# Patient Record
Sex: Female | Born: 1985 | Hispanic: No | Marital: Married | Smoking: Never smoker
Health system: Southern US, Community
[De-identification: ages and names within clinical notes are randomized; demographics above are authoritative.]

## PROBLEM LIST (undated history)

## (undated) ENCOUNTER — Inpatient Hospital Stay (HOSPITAL_COMMUNITY): Payer: Self-pay

## (undated) DIAGNOSIS — R42 Dizziness and giddiness: Secondary | ICD-10-CM

## (undated) DIAGNOSIS — R0600 Dyspnea, unspecified: Secondary | ICD-10-CM

## (undated) DIAGNOSIS — R51 Headache: Secondary | ICD-10-CM

## (undated) DIAGNOSIS — R519 Headache, unspecified: Secondary | ICD-10-CM

## (undated) DIAGNOSIS — D699 Hemorrhagic condition, unspecified: Secondary | ICD-10-CM

## (undated) DIAGNOSIS — E049 Nontoxic goiter, unspecified: Secondary | ICD-10-CM

## (undated) DIAGNOSIS — O24419 Gestational diabetes mellitus in pregnancy, unspecified control: Secondary | ICD-10-CM

## (undated) HISTORY — DX: Gestational diabetes mellitus in pregnancy, unspecified control: O24.419

---

## 2014-01-25 ENCOUNTER — Inpatient Hospital Stay (HOSPITAL_COMMUNITY)
Admission: AD | Admit: 2014-01-25 | Discharge: 2014-01-25 | Disposition: A | Discharge status: Home / Self Care | Payer: Medicaid Other | Source: Ambulatory Visit | Attending: Obstetrics & Gynecology | Admitting: Obstetrics & Gynecology

## 2014-01-25 ENCOUNTER — Encounter (HOSPITAL_COMMUNITY): Payer: Self-pay

## 2014-01-25 ENCOUNTER — Inpatient Hospital Stay (HOSPITAL_COMMUNITY): Payer: Medicaid Other

## 2014-01-25 DIAGNOSIS — O26851 Spotting complicating pregnancy, first trimester: Secondary | ICD-10-CM

## 2014-01-25 DIAGNOSIS — R109 Unspecified abdominal pain: Secondary | ICD-10-CM | POA: Diagnosis present

## 2014-01-25 DIAGNOSIS — N760 Acute vaginitis: Secondary | ICD-10-CM | POA: Diagnosis not present

## 2014-01-25 DIAGNOSIS — A499 Bacterial infection, unspecified: Secondary | ICD-10-CM

## 2014-01-25 DIAGNOSIS — O26859 Spotting complicating pregnancy, unspecified trimester: Secondary | ICD-10-CM | POA: Insufficient documentation

## 2014-01-25 DIAGNOSIS — N390 Urinary tract infection, site not specified: Secondary | ICD-10-CM | POA: Insufficient documentation

## 2014-01-25 DIAGNOSIS — B9689 Other specified bacterial agents as the cause of diseases classified elsewhere: Secondary | ICD-10-CM | POA: Diagnosis not present

## 2014-01-25 DIAGNOSIS — O2390 Unspecified genitourinary tract infection in pregnancy, unspecified trimester: Secondary | ICD-10-CM | POA: Diagnosis not present

## 2014-01-25 LAB — URINALYSIS, ROUTINE W REFLEX MICROSCOPIC
Bilirubin Urine: NEGATIVE
Glucose, UA: NEGATIVE mg/dL
KETONES UR: NEGATIVE mg/dL
Leukocytes, UA: NEGATIVE
Nitrite: POSITIVE — AB
PROTEIN: NEGATIVE mg/dL
Specific Gravity, Urine: 1.025 (ref 1.005–1.030)
Urobilinogen, UA: 0.2 mg/dL (ref 0.0–1.0)
pH: 6 (ref 5.0–8.0)

## 2014-01-25 LAB — WET PREP, GENITAL
Trich, Wet Prep: NONE SEEN
Yeast Wet Prep HPF POC: NONE SEEN

## 2014-01-25 LAB — CBC WITH DIFFERENTIAL/PLATELET
BASOS ABS: 0 10*3/uL (ref 0.0–0.1)
BASOS PCT: 0 % (ref 0–1)
EOS ABS: 0.1 10*3/uL (ref 0.0–0.7)
EOS PCT: 1 % (ref 0–5)
HEMATOCRIT: 38.2 % (ref 36.0–46.0)
Hemoglobin: 13.6 g/dL (ref 12.0–15.0)
Lymphocytes Relative: 19 % (ref 12–46)
Lymphs Abs: 2.6 10*3/uL (ref 0.7–4.0)
MCH: 28.6 pg (ref 26.0–34.0)
MCHC: 35.6 g/dL (ref 30.0–36.0)
MCV: 80.3 fL (ref 78.0–100.0)
MONO ABS: 0.9 10*3/uL (ref 0.1–1.0)
Monocytes Relative: 6 % (ref 3–12)
Neutro Abs: 10.2 10*3/uL — ABNORMAL HIGH (ref 1.7–7.7)
Neutrophils Relative %: 74 % (ref 43–77)
Platelets: 187 10*3/uL (ref 150–400)
RBC: 4.76 MIL/uL (ref 3.87–5.11)
RDW: 12.9 % (ref 11.5–15.5)
WBC: 13.8 10*3/uL — ABNORMAL HIGH (ref 4.0–10.5)

## 2014-01-25 LAB — URINE MICROSCOPIC-ADD ON

## 2014-01-25 LAB — HCG, QUANTITATIVE, PREGNANCY: hCG, Beta Chain, Quant, S: 2057 m[IU]/mL — ABNORMAL HIGH (ref <5)

## 2014-01-25 LAB — POCT PREGNANCY, URINE: Preg Test, Ur: POSITIVE — AB

## 2014-01-25 MED ORDER — METRONIDAZOLE 0.75 % VA GEL: 1.0000 | Freq: Two times a day (BID) | VAGINAL | Status: DC

## 2014-01-25 NOTE — MAU Note (Signed)
LMP 12/20/13, spotting yesterday. Lower abd pain today.

## 2014-01-25 NOTE — MAU Provider Note (Signed)
History     CSN: [id number]  Arrival date and time: 01/25/14 3382   First Provider Initiated Contact with Patient 01/25/14 2018      Chief Complaint  Patient presents with  . Possible Pregnancy  . Abdominal Pain  . Vaginal Bleeding   HPI [name] is a 28 y.o. G1P0 at [redacted]w[redacted]d who presents to MAU today with complaint of lower abdominal cramping and spotting. She states that this started yesterday. She states minimal bleeding and has not needed to wear a pad. She states some nausea without vomiting. She denies fever. She does endorse occasional dizziness.    OB History   Grav Para Term Preterm Abortions TAB SAB Ect Mult Living   1               Past Medical History  Diagnosis Date  . Medical history non-contributory     Past Surgical History  Procedure Laterality Date  . No past surgeries      No family history on file.  History  Substance Use Topics  . Smoking status: Never Smoker   . Smokeless tobacco: Never Used  . Alcohol Use: No    Allergies: No Known Allergies  No prescriptions prior to admission    Review of Systems  Constitutional: Negative for fever and malaise/fatigue.  Gastrointestinal: Positive for nausea and abdominal pain. Negative for vomiting.  Genitourinary:       + vaginal bleeding  Neurological: Positive for dizziness. Negative for loss of consciousness.   Physical Exam   Blood pressure 101/60, pulse 95, temperature 98.3 F (36.8 C), temperature source Oral, resp. rate 18, height 5\' 2"  (1.575 m), weight 137 lb (62.143 kg), last menstrual period 12/20/2013, SpO2 100.00%.  Physical Exam  Constitutional: She is oriented to person, place, and time. She appears well-developed and well-nourished. No distress.  HENT:  Head: Normocephalic.  Cardiovascular: Normal rate.   Respiratory: Effort normal.  GI: Soft. She exhibits no distension. There is tenderness (mild tenderness to palpation of the suprapubic region).  Genitourinary: Uterus is  tender (mild). Uterus is not enlarged. Cervix exhibits no motion tenderness, no discharge and no friability. Right adnexum displays no mass and no tenderness. Left adnexum displays no mass and no tenderness. No bleeding around the vagina. Vaginal discharge (scant thin white discharge noted) found.  Cervix: closed  Neurological: She is alert and oriented to person, place, and time.  Skin: Skin is warm and dry. No erythema.  Psychiatric: She has a normal mood and affect.   Results for orders placed during the hospital encounter of 01/25/14 (from the past 24 hour(s))  URINALYSIS, ROUTINE W REFLEX MICROSCOPIC     Status: Abnormal   Collection Time    01/25/14  7:50 PM      Result Value Ref Range   Color, Urine YELLOW  YELLOW   APPearance CLEAR  CLEAR   Specific Gravity, Urine 1.025  1.005 - 1.030   pH 6.0  5.0 - 8.0   Glucose, UA NEGATIVE  NEGATIVE mg/dL   Hgb urine dipstick SMALL (*) NEGATIVE   Bilirubin Urine NEGATIVE  NEGATIVE   Ketones, ur NEGATIVE  NEGATIVE mg/dL   Protein, ur NEGATIVE  NEGATIVE mg/dL   Urobilinogen, UA 0.2  0.0 - 1.0 mg/dL   Nitrite POSITIVE (*) NEGATIVE   Leukocytes, UA NEGATIVE  NEGATIVE  URINE MICROSCOPIC-ADD ON     Status: Abnormal   Collection Time    01/25/14  7:50 PM      Result  Value Ref Range   Squamous Epithelial / LPF RARE  RARE   WBC, UA 3-6  <3 WBC/hpf   RBC / HPF 7-10  <3 RBC/hpf   Bacteria, UA MANY (*) RARE  POCT PREGNANCY, URINE     Status: Abnormal   Collection Time    01/25/14  7:59 PM      Result Value Ref Range   Preg Test, Ur POSITIVE (*) NEGATIVE  WET PREP, GENITAL     Status: Abnormal   Collection Time    01/25/14  8:22 PM      Result Value Ref Range   Yeast Wet Prep HPF POC NONE SEEN  NONE SEEN   Trich, Wet Prep NONE SEEN  NONE SEEN   Clue Cells Wet Prep HPF POC FEW (*) NONE SEEN   WBC, Wet Prep HPF POC MODERATE (*) NONE SEEN  CBC WITH DIFFERENTIAL     Status: Abnormal   Collection Time    01/25/14  8:26 PM      Result Value  Ref Range   WBC 13.8 (*) 4.0 - 10.5 K/uL   RBC 4.76  3.87 - 5.11 MIL/uL   Hemoglobin 13.6  12.0 - 15.0 g/dL   HCT 38.2  36.0 - 46.0 %   MCV 80.3  78.0 - 100.0 fL   MCH 28.6  26.0 - 34.0 pg   MCHC 35.6  30.0 - 36.0 g/dL   RDW 12.9  11.5 - 15.5 %   Platelets 187  150 - 400 K/uL   Neutrophils Relative % 74  43 - 77 %   Neutro Abs 10.2 (*) 1.7 - 7.7 K/uL   Lymphocytes Relative 19  12 - 46 %   Lymphs Abs 2.6  0.7 - 4.0 K/uL   Monocytes Relative 6  3 - 12 %   Monocytes Absolute 0.9  0.1 - 1.0 K/uL   Eosinophils Relative 1  0 - 5 %   Eosinophils Absolute 0.1  0.0 - 0.7 K/uL   Basophils Relative 0  0 - 1 %   Basophils Absolute 0.0  0.0 - 0.1 K/uL  ABO/RH     Status: None   Collection Time    01/25/14  8:26 PM      Result Value Ref Range   ABO/RH(D) B POS    HCG, QUANTITATIVE, PREGNANCY     Status: Abnormal   Collection Time    01/25/14  8:26 PM      Result Value Ref Range   hCG, Beta Chain, Quant, S 2057 (*) <5 mIU/mL   US Ob Comp Less 14 Wks  01/25/2014   CLINICAL DATA:  Vaginal bleeding, quantitative beta HCG 2057  EXAM: OBSTETRIC <14 WK [place] AND TRANSVAGINAL OB US  TECHNIQUE: Both transabdominal and transvaginal ultrasound examinations were performed for complete evaluation of the gestation as well as the maternal uterus, adnexal regions, and pelvic cul-de-sac. Transvaginal technique was performed to assess early pregnancy.  COMPARISON:  None.  FINDINGS: Intrauterine gestational sac: 3 x 2 mm oval fluid containing structure within the upper endometrial canal, possibly an early gestational sac.  Yolk sac:  Not identified  Embryo:  Not identified  MSD:  2.9  mm   4 w   5  d  [place] EDC: 09/29/2014  Maternal uterus/adnexae: No subchorionic hemorrhage. Right ovary is normal. 2.8 x 2.8 x 2.2 cm corpus luteum left ovary. No free fluid.  IMPRESSION: Probable early intrauterine gestational sac, but no yolk sac, fetal pole, or cardiac activity  yet visualized. Recommend follow-up quantitative B-HCG levels  and follow-up US in 14 days to confirm and assess viability. This recommendation follows SRU consensus guidelines: Diagnostic Criteria for Nonviable Pregnancy Early in the First Trimester. [employer] Med 2013; 542:7062-37.   Electronically Signed   By: Skipper Cliche M.D.   On: 01/25/2014 21:20   US Ob Transvaginal  01/25/2014   CLINICAL DATA:  Vaginal bleeding, quantitative beta HCG 2057  EXAM: OBSTETRIC <14 WK [place] AND TRANSVAGINAL OB US  TECHNIQUE: Both transabdominal and transvaginal ultrasound examinations were performed for complete evaluation of the gestation as well as the maternal uterus, adnexal regions, and pelvic cul-de-sac. Transvaginal technique was performed to assess early pregnancy.  COMPARISON:  None.  FINDINGS: Intrauterine gestational sac: 3 x 2 mm oval fluid containing structure within the upper endometrial canal, possibly an early gestational sac.  Yolk sac:  Not identified  Embryo:  Not identified  MSD:  2.9  mm   4 w   5  d  [place] EDC: 09/29/2014  Maternal uterus/adnexae: No subchorionic hemorrhage. Right ovary is normal. 2.8 x 2.8 x 2.2 cm corpus luteum left ovary. No free fluid.  IMPRESSION: Probable early intrauterine gestational sac, but no yolk sac, fetal pole, or cardiac activity yet visualized. Recommend follow-up quantitative B-HCG levels and follow-up US in 14 days to confirm and assess viability. This recommendation follows SRU consensus guidelines: Diagnostic Criteria for Nonviable Pregnancy Early in the First Trimester. [employer] Med 2013; 628:3151-76.   Electronically Signed   By: Skipper Cliche M.D.   On: 01/25/2014 21:20    MAU Course  Procedures None  MDM +UPT UA, wet prep, GC/Chlamydia, CBC, ABO/Rh, quant hCG, HIV and [place] today  Assessment and Plan  A: Abdominal pain in pregnancy Spotting in preg, first trimester Bacterial vaginosis UTI  P: Discharge home Rx for Metrogel given to patient. Rx for Keflex sent to patient's pharmacy Bleeding/ectopic precautions  discussed Patient advised to follow-up in [place] on 01/27/14 between 1pm-3pm for follow-up labs Patient may return to MAU as needed or if her condition were to change or worsen   Luvenia Redden, PA-C  01/26/2014, 12:49 AM

## 2014-01-25 NOTE — Discharge Instructions (Signed)
First Trimester of Pregnancy The first trimester of pregnancy is from week 1 until the end of week 12 (months 1 through 3). During this time, your baby will begin to develop inside you. At 6-8 weeks, the eyes and face are formed, and the heartbeat can be seen on ultrasound. At the end of 12 weeks, all the baby's organs are formed. Prenatal care is all the medical care you receive before the birth of your baby. Make sure you get good prenatal care and follow all of your doctor's instructions. HOME CARE  Medicines  Take medicine only as told by your doctor. Some medicines are safe and some are not during pregnancy.  Take your prenatal vitamins as told by your doctor.  Take medicine that helps you poop (stool softener) as needed if your doctor says it is okay. Diet  Eat regular, healthy meals.  Your doctor will tell you the amount of weight gain that is right for you.  Avoid raw meat and uncooked cheese.  If you feel sick to your stomach (nauseous) or throw up (vomit):  Eat 4 or 5 small meals a day instead of 3 large meals.  Try eating a few soda crackers.  Drink liquids between meals instead of during meals.  If you have a hard time pooping (constipation):  Eat high-fiber foods like fresh vegetables, fruit, and whole grains.  Drink enough fluids to keep your pee (urine) clear or pale yellow. Activity and Exercise  Exercise only as told by your doctor. Stop exercising if you have cramps or pain in your lower belly (abdomen) or low back.  Try to avoid standing for long periods of time. Move your legs often if you must stand in one place for a long time.  Avoid heavy lifting.  Wear low-heeled shoes. Sit and stand up straight.  You can have sex unless your doctor tells you not to. Relief of Pain or Discomfort  Wear a good support bra if your breasts are sore.  Take warm water baths (sitz baths) to soothe pain or discomfort caused by hemorrhoids. Use hemorrhoid cream if your  doctor says it is okay.  Rest with your legs raised if you have leg cramps or low back pain.  Wear support hose if you have puffy, bulging veins (varicose veins) in your legs. Raise (elevate) your feet for 15 minutes, 3-4 times a day. Limit salt in your diet. Prenatal Care  Schedule your prenatal visits by the twelfth week of pregnancy.  Write down your questions. Take them to your prenatal visits.  Keep all your prenatal visits as told by your doctor. Safety  Wear your seat belt at all times when driving.  Make a list of emergency phone numbers. The list should include numbers for family, friends, the hospital, and police and fire departments. General Tips  Ask your doctor for a referral to a local prenatal class. Begin classes no later than at the start of month 6 of your pregnancy.  Ask for help if you need counseling or help with nutrition. Your doctor can give you advice or tell you where to go for help.  Do not use hot tubs, steam rooms, or saunas.  Do not douche or use tampons or scented sanitary pads.  Do not cross your legs for long periods of time.  Avoid litter boxes and soil used by cats.  Avoid all smoking, herbs, and alcohol. Avoid drugs not approved by your doctor.  Visit your dentist. At home, brush your teeth  with a soft toothbrush. Be gentle when you floss. GET HELP IF:  You are dizzy.  You have mild cramps or pressure in your lower belly.  You have a nagging pain in your belly area.  You continue to feel sick to your stomach, throw up, or have watery poop (diarrhea).  You have a bad smelling fluid coming from your vagina.  You have pain with peeing (urination).  You have increased puffiness (swelling) in your face, hands, legs, or ankles. GET HELP RIGHT AWAY IF:   You have a fever.  You are leaking fluid from your vagina.  You have spotting or bleeding from your vagina.  You have very bad belly cramping or pain.  You gain or lose weight  rapidly.  You throw up blood. It may look like coffee grounds.  You are around people who have [place] measles, fifth disease, or chickenpox.  You have a very bad headache.  You have shortness of breath.  You have any kind of trauma, such as from a fall or a car accident. Document Released: 10/09/2007 Document Revised: 09/06/2013 Document Reviewed: 03/02/2013 [facility] Patient Information 2015 [place], [place]. This information is not intended to replace advice given to you by your health care provider. Make sure you discuss any questions you have with your health care provider. Bacterial Vaginosis Bacterial vaginosis is an infection of the vagina. It happens when too many of certain germs (bacteria) grow in the vagina. HOME CARE  Take your medicine as told by your doctor.  Finish your medicine even if you start to feel better.  Do not have sex until you finish your medicine and are better.  Tell your sex partner that you have an infection. They should see their doctor for treatment.  Practice safe sex. Use condoms. Have only one sex partner. GET HELP IF:  You are not getting better after 3 days of treatment.  You have more grey fluid (discharge) coming from your vagina than before.  You have more pain than before.  You have a fever. MAKE SURE YOU:   Understand these instructions.  Will watch your condition.  Will get help right away if you are not doing well or get worse. Document Released: 01/30/2008 Document Revised: 02/10/2013 Document Reviewed: 12/02/2012 [facility] Patient Information 2015 [place], [place]. This information is not intended to replace advice given to you by your health care provider. Make sure you discuss any questions you have with your health care provider.

## 2014-01-26 LAB — HIV ANTIBODY (ROUTINE TESTING W REFLEX): HIV 1&2 Ab, 4th Generation: NONREACTIVE

## 2014-01-26 LAB — GC/CHLAMYDIA PROBE AMP
CT PROBE, AMP APTIMA: NEGATIVE
GC Probe RNA: NEGATIVE

## 2014-01-26 LAB — ABO/RH: ABO/RH(D): B POS

## 2014-01-26 MED ORDER — CEPHALEXIN 500 MG PO CAPS: 500.0000 mg | ORAL_CAPSULE | Freq: Four times a day (QID) | ORAL | Status: DC

## 2014-01-27 ENCOUNTER — Other Ambulatory Visit: Payer: Self-pay | Admitting: Family Medicine

## 2014-01-27 ENCOUNTER — Telehealth: Payer: Self-pay | Admitting: General Practice

## 2014-01-27 ENCOUNTER — Other Ambulatory Visit: Payer: Medicaid Other

## 2014-01-27 DIAGNOSIS — O283 Abnormal ultrasonic finding on antenatal screening of mother: Secondary | ICD-10-CM

## 2014-01-27 DIAGNOSIS — Z3490 Encounter for supervision of normal pregnancy, unspecified, unspecified trimester: Secondary | ICD-10-CM

## 2014-01-27 LAB — HCG, QUANTITATIVE, PREGNANCY: hCG, Beta Chain, Quant, S: 5210 m[IU]/mL

## 2014-01-27 NOTE — Telephone Encounter (Signed)
Called patient and informed her of results and need for follow up ultrasound in 2 weeks. Patient verbalized understanding and states that she is moving in [place] next week so she will not be here. Told patient to make sure she has a follow up ultrasound in the next 2 weeks whenever she will be receiving prenatal care. Patient verbalized understanding and had no other questions

## 2014-01-27 NOTE — Telephone Encounter (Signed)
Message copied by Shelly Coss on Thu Jan 27, 2014  3:36 PM ------      Message from: Truett Mainland      Created: Thu Jan 27, 2014  3:25 PM       Had normal rise in bHCG. ------

## 2014-01-27 NOTE — Progress Notes (Unsigned)
Patient denies any pain or bleeding. Here for repeat beta hcg.

## 2014-03-08 ENCOUNTER — Encounter (HOSPITAL_COMMUNITY): Payer: Self-pay

## 2014-03-22 LAB — OB RESULTS CONSOLE ANTIBODY SCREEN: ANTIBODY SCREEN: NEGATIVE

## 2014-03-22 LAB — OB RESULTS CONSOLE RPR: RPR: NONREACTIVE

## 2014-03-22 LAB — OB RESULTS CONSOLE ABO/RH: RH Type: POSITIVE

## 2014-03-22 LAB — OB RESULTS CONSOLE HGB/HCT, BLOOD: HEMOGLOBIN: 12.3 g/dL

## 2014-04-13 LAB — OB RESULTS CONSOLE GC/CHLAMYDIA
Chlamydia: NEGATIVE
GC PROBE AMP, GENITAL: NEGATIVE

## 2014-04-13 LAB — CYTOLOGY - PAP: Pap: ABNORMAL — AB

## 2014-07-06 ENCOUNTER — Inpatient Hospital Stay (HOSPITAL_COMMUNITY)
Admission: AD | Admit: 2014-07-06 | Discharge: 2014-07-06 | Disposition: A | Discharge status: Home / Self Care | Payer: Medicaid Other | Source: Ambulatory Visit | Attending: Obstetrics & Gynecology | Admitting: Obstetrics & Gynecology

## 2014-07-06 DIAGNOSIS — Z3A27 27 weeks gestation of pregnancy: Secondary | ICD-10-CM | POA: Diagnosis not present

## 2014-07-06 DIAGNOSIS — O9989 Other specified diseases and conditions complicating pregnancy, childbirth and the puerperium: Secondary | ICD-10-CM | POA: Diagnosis not present

## 2014-07-06 DIAGNOSIS — R509 Fever, unspecified: Secondary | ICD-10-CM | POA: Diagnosis not present

## 2014-07-06 DIAGNOSIS — R05 Cough: Secondary | ICD-10-CM | POA: Insufficient documentation

## 2014-07-06 DIAGNOSIS — R51 Headache: Secondary | ICD-10-CM | POA: Insufficient documentation

## 2014-07-06 DIAGNOSIS — B9789 Other viral agents as the cause of diseases classified elsewhere: Secondary | ICD-10-CM

## 2014-07-06 DIAGNOSIS — J069 Acute upper respiratory infection, unspecified: Secondary | ICD-10-CM

## 2014-07-06 LAB — URINE MICROSCOPIC-ADD ON

## 2014-07-06 LAB — URINALYSIS, ROUTINE W REFLEX MICROSCOPIC
Bilirubin Urine: NEGATIVE
GLUCOSE, UA: NEGATIVE mg/dL
Ketones, ur: NEGATIVE mg/dL
Leukocytes, UA: NEGATIVE
Nitrite: NEGATIVE
Protein, ur: NEGATIVE mg/dL
SPECIFIC GRAVITY, URINE: 1.01 (ref 1.005–1.030)
Urobilinogen, UA: 0.2 mg/dL (ref 0.0–1.0)
pH: 6 (ref 5.0–8.0)

## 2014-07-06 NOTE — Discharge Instructions (Signed)
Medicines During Pregnancy During pregnancy, there are medicines that are either safe or unsafe to take. Medicines include prescriptions from your caregiver, over-the-counter medicines, topical creams applied to the skin, and all herbal substances. Medicines are put into either Class A, B, C, or D. Class A and B medicines have been shown to be safe in pregnancy. Class C medicines are also considered to be safe in pregnancy, but these medicines should only be used when necessary. Class D medicines should not be used at all in pregnancy. They can be harmful to a baby.  It is best to take as little medicine as possible while pregnant. However, some medicines are necessary to take for the mother and baby's health. Sometimes, it is more dangerous to stop taking certain medicines than to stay on them. This is often the case for people with long-term (chronic) conditions such as asthma, diabetes, or high blood pressure (hypertension). If you are pregnant and have a chronic illness, call your caregiver right away. Bring a list of your medicines and their doses to your appointments. If you are planning to become pregnant, schedule a doctor's appointment and discuss your medicines with your caregiver. Lastly, write down the phone number to your pharmacist. They can answer questions regarding a medicine's class and safety. They cannot give advice as to whether you should or should not be on a medicine.  SAFE AND UNSAFE MEDICINES There is a long list of medicines that are considered safe in pregnancy. Below is a shorter list. For specific medicines, ask your caregiver.  AllergyMedicines Loratadine, cetirizine, and chlorpheniramine are safe to take. Certain nasal steroid sprays are safe. Talk to your caregiver about specific brands that are safe. Analgesics Acetaminophen and acetaminophen with codeine are safe to take. All other nonsteroidal anti-inflammatory drugs (NSAIDS) are not safe. This includes ibuprofen.    Antacids Many over-the-counter antacids are safe to take. Talk to your caregiver about specific brands that are safe. Famotidine, ranitidine, and lansoprazole are safe. Omepresole is considered safe to take in the second trimester. Antibiotic Medicines There are several antibiotics to avoid. These include, but are not limited to, tetracyline, quinolones, and sulfa medications. Talk to your caregiver before taking any antibiotic.  Antihistamines Talk to your caregiver about specific brands that are safe.  Asthma Medicines Most asthma steroid inhalers are safe to take. Talk to your caregiver for specific details. Calcium Calcium supplements are safe to take. Do not take oyster shell calcium.  Cough and Cold Medicines It is safe to take products with guaifenesin or dextromethorphan. Talk to your caregiver about specific brands that are safe. It is not safe to take products that contain aspirin or ibuprofen. Decongestant Medicines Pseudoephedrine-containing products are safe to take in the second and third trimester.  Saline nasal sprays and adhesive nasal strips  Depression Medicines Talk about these medicines with your caregiver.  Antidiarrheal Medicines It is safe to take loperamide. Talk to your caregiver about specific brands that are safe. It is not safe to take any antidiarrheal medicine that contains bismuth. Eyedrops Allergy eyedrops should be limited.  Iron It is safe to use certain iron-containing medicines for anemia in pregnancy. They require a prescription.  Antinausea Medicines It is safe to take doxylamine and vitamin B6 as directed. There are other prescription medicines available, if needed.  Sleep aids It is safe to take diphenhydramine and acetaminophen with diphenhydramine.  Steroids Hydrocortisone creams are safe to use as directed. Oral steroids require a prescription. It is not safe to  take any hemorrhoid cream with pramoxine or phenylephrine. Stool  softener It is safe to take stool softener medicines. Avoid daily or prolonged use of stool softeners. Thyroid Medicine It is important to stay on this thyroid medicine. It needs to be followed by your caregiver.  Vaginal Medicines Your caregiver will prescribe a medicine to you if you have a vaginal infection. Certain antifungal medicines are safe to use if you have a sexually transmitted infection (STI). Talk to your caregiver.  Document Released: 04/22/2005 Document Revised: 07/15/2011 Document Reviewed: 04/23/2011 [facility] Patient Information 2015 [place], [place]. This information is not intended to replace advice given to you by your health care provider. Make sure you discuss any questions you have with your health care provider.

## 2014-07-06 NOTE — MAU Provider Note (Signed)
  History     CSN: [id number]  Arrival date and time: 07/06/14 1847   None    Chief Complaint  Patient presents with  . Generalized Body Aches  . Fever  . Cough   HPI Debra Mills is a 29yo female G1P0 presenting at 27 weeks and 6 days presenting with productive cough with yellow mucus, headache, and abdominal and back pain occuring with cough since yesterday. Reports subjective fever, with no objective measurements. Denies sick contacts. Denies edema, blurred vision, chest pain. Denies vaginal bleeding or loss of fluid. Denies contractions. Continues to note fetal movements.  OB History    Gravida Para Term Preterm AB TAB SAB Ectopic Multiple Living   1               Past Medical History  Diagnosis Date  . Medical history non-contributory     Past Surgical History  Procedure Laterality Date  . No past surgeries      No family history on file.  History  Substance Use Topics  . Smoking status: Never Smoker   . Smokeless tobacco: Never Used  . Alcohol Use: No    Allergies:  Allergies  Allergen Reactions  . Fish Allergy Hives    Prescriptions prior to admission  Medication Sig Dispense Refill Last Dose  . Prenatal Vit-Fe Fumarate-FA (PRENATAL MULTIVITAMIN) TABS tablet Take 1 tablet by mouth daily at 12 noon.   07/05/2014 at Unknown time  . cephALEXin (KEFLEX) 500 MG capsule Take 1 capsule (500 mg total) by mouth 4 (four) times daily. (Patient not taking: Reported on 07/06/2014) 20 capsule 0 Completed Course at Unknown time  . metroNIDAZOLE (METROGEL VAGINAL) 0.75 % vaginal gel Place 1 Applicatorful vaginally 2 (two) times daily. (Patient not taking: Reported on 07/06/2014) 70 g 0 Completed Course at Unknown time    Review of Systems  Constitutional: Positive for fever.  Respiratory: Positive for cough.   Neurological: Positive for headaches.   Physical Exam   Blood pressure 109/69, pulse 115, temperature 98.9 F (37.2 C), temperature source Oral, resp. rate 18, height  5' (1.524 m), weight 74.844 kg (165 lb), last menstrual period 12/20/2013.  Physical Exam  Constitutional: She appears well-developed and well-nourished. No distress.  Cardiovascular: Normal rate and regular rhythm.  Exam reveals no gallop and no friction rub.   No murmur heard. Respiratory: Effort normal. No respiratory distress. She has no wheezes. She has no rales.  Cough noted  GI: Soft. There is no tenderness.  Musculoskeletal: She exhibits no edema.  Skin: No rash noted.  Fetal Monitor: baseline 140s bpm, moderate variability, + accelerations, no decelerations noted  MAU Course  Procedures  MDM Urinalysis shows Mills bacteria, many squamous cells, trace hemoglobin BP within normal limits  Assessment and Plan  Most likely viral in etiology. No antibiotics indicated. Treat symptomatically. Will give list of safe medications to use during pregnancy.  Prenatal care in [place], [place]. Appointment Friday (07/08/14) Stable for discharge  Debra Mills 07/06/2014, 8:30 PM   I have seen and examined this patient and I agree with the above. Now pt is deciding to tx care to [facility] and would like to be seen at [facility]- contact info given and inbox msg to clinic. Debra Mills CNM 9:28 PM 07/06/2014

## 2014-07-06 NOTE — MAU Note (Signed)
Pt presents to MAU with complaints of generalized body aches, fever, cough and headache since yesterday. Denies any vaginal bleeding or LOF

## 2014-07-06 NOTE — Progress Notes (Signed)
Pt noted having a productive cough with thin yellow mucous. Pt complains of not being able to cough it up effectively. General head, chest, and body aches when coughing worse since yesterday.

## 2014-07-08 LAB — GLUCOSE TOLERANCE, 1 HOUR (50G) W/O FASTING: Glucose, GTT - 1 Hour: 175 mg/dL (ref ?–200)

## 2014-07-08 LAB — OB RESULTS CONSOLE HGB/HCT, BLOOD: Hemoglobin: 12.9 g/dL

## 2014-07-17 ENCOUNTER — Encounter (HOSPITAL_COMMUNITY): Payer: Self-pay | Admitting: *Deleted

## 2014-07-17 ENCOUNTER — Inpatient Hospital Stay (HOSPITAL_COMMUNITY)
Admission: AD | Admit: 2014-07-17 | Discharge: 2014-07-17 | Disposition: A | Discharge status: Home / Self Care | Payer: Medicaid Other | Source: Ambulatory Visit | Attending: Obstetrics and Gynecology | Admitting: Obstetrics and Gynecology

## 2014-07-17 DIAGNOSIS — O9989 Other specified diseases and conditions complicating pregnancy, childbirth and the puerperium: Secondary | ICD-10-CM | POA: Insufficient documentation

## 2014-07-17 DIAGNOSIS — H6692 Otitis media, unspecified, left ear: Secondary | ICD-10-CM | POA: Insufficient documentation

## 2014-07-17 DIAGNOSIS — R51 Headache: Secondary | ICD-10-CM | POA: Insufficient documentation

## 2014-07-17 DIAGNOSIS — Z3A30 30 weeks gestation of pregnancy: Secondary | ICD-10-CM | POA: Diagnosis not present

## 2014-07-17 MED ORDER — AMOXICILLIN-POT CLAVULANATE 875-125 MG PO TABS: 1.0000 | ORAL_TABLET | Freq: Two times a day (BID) | ORAL | Status: DC

## 2014-07-17 MED ORDER — ACETAMINOPHEN-CODEINE #3 300-30 MG PO TABS
1.0000 | ORAL_TABLET | ORAL | Status: DC | PRN
  Administered 2014-07-17: 1 via ORAL
  Filled 2014-07-17: qty 1

## 2014-07-17 MED ORDER — AMOXICILLIN-POT CLAVULANATE 875-125 MG PO TABS
1.0000 | ORAL_TABLET | Freq: Two times a day (BID) | ORAL | Status: DC
  Administered 2014-07-17: 1 via ORAL
  Filled 2014-07-17 (×2): qty 1

## 2014-07-17 MED ORDER — PSEUDOEPHEDRINE HCL ER 120 MG PO TB12: 120.0000 mg | ORAL_TABLET | Freq: Two times a day (BID) | ORAL | Status: DC

## 2014-07-17 MED ORDER — PSEUDOEPHEDRINE HCL ER 120 MG PO TB12
120.0000 mg | ORAL_TABLET | Freq: Two times a day (BID) | ORAL | Status: DC
  Administered 2014-07-17: 120 mg via ORAL
  Filled 2014-07-17 (×2): qty 1

## 2014-07-17 MED ORDER — LORATADINE 10 MG PO TABS
10.0000 mg | ORAL_TABLET | Freq: Every day | ORAL | Status: DC
  Administered 2014-07-17: 10 mg via ORAL
  Filled 2014-07-17 (×2): qty 1

## 2014-07-17 MED ORDER — LORATADINE 10 MG PO TABS: 10.0000 mg | ORAL_TABLET | Freq: Every day | ORAL | Status: DC

## 2014-07-17 MED ORDER — ACETAMINOPHEN-CODEINE #3 300-30 MG PO TABS
1.0000 | ORAL_TABLET | ORAL | Status: DC | PRN
Start: 2014-07-17 — End: 2014-08-22

## 2014-07-17 NOTE — MAU Provider Note (Signed)
  History    G1 at 30 wks in with c/o severe ear ache and headache since yesterday on left side.denies fever. CSN: [id number]  Arrival date and time: 07/17/14 1438   None     Chief Complaint  Patient presents with  . Headache  . Otalgia   HPI  OB History    Gravida Para Term Preterm AB TAB SAB Ectopic Multiple Living   1               Past Medical History  Diagnosis Date  . Medical history non-contributory     Past Surgical History  Procedure Laterality Date  . No past surgeries      History reviewed. No pertinent family history.  History  Substance Use Topics  . Smoking status: Never Smoker   . Smokeless tobacco: Never Used  . Alcohol Use: No    Allergies:  Allergies  Allergen Reactions  . Fish Allergy Hives    Prescriptions prior to admission  Medication Sig Dispense Refill Last Dose  . Prenatal Vit-Fe Fumarate-FA (PRENATAL MULTIVITAMIN) TABS tablet Take 1 tablet by mouth daily at 12 noon.   07/05/2014 at Unknown time    Review of Systems  Constitutional: Negative.   HENT: Positive for ear pain.   Eyes: Negative.   Respiratory: Negative.   Cardiovascular: Negative.   Gastrointestinal: Negative.   Genitourinary: Negative.   Musculoskeletal: Negative.   Skin: Negative.   Neurological: Positive for headaches.  Endo/Heme/Allergies: Negative.   Psychiatric/Behavioral: Negative.    Physical Exam   Blood pressure 119/67, pulse 115, temperature 99 F (37.2 C), last menstrual period 12/20/2013.  Physical Exam  Constitutional: She is oriented to person, place, and time. She appears well-developed and well-nourished.  HENT:  Head: Normocephalic.  Right Ear: External ear normal.  Left ear drim red and inflammed, pt very sensitive to manipulation. Very uncomfortable.  Eyes: Pupils are equal, round, and reactive to light.  Neck: Normal range of motion.  Cardiovascular: Normal rate, regular rhythm, normal heart sounds and intact distal pulses.    Respiratory: Effort normal and breath sounds normal.  GI: Soft. Bowel sounds are normal.  Genitourinary: Vagina normal.  Musculoskeletal: Normal range of motion.  Neurological: She is alert and oriented to person, place, and time. She has normal reflexes.  Skin: Skin is warm and dry.  Psychiatric: She has a normal mood and affect. Her behavior is normal. Judgment and thought content normal.    MAU Course  Procedures  MDM Left otitis media  Assessment and Plan  Left otitis media, augmentin, pain management, and decongestant.  Koren Shiver DARLENE 07/17/2014, 3:22 PM

## 2014-07-17 NOTE — MAU Note (Signed)
Pt presents to MAU with complaints of headache and earache. Denies any vaginal bleeding or LOF. Reports baby is active

## 2014-08-04 ENCOUNTER — Encounter: Payer: Self-pay | Admitting: Obstetrics and Gynecology

## 2014-08-04 ENCOUNTER — Ambulatory Visit (INDEPENDENT_AMBULATORY_CARE_PROVIDER_SITE_OTHER): Payer: Medicaid Other | Admitting: Obstetrics and Gynecology

## 2014-08-04 VITALS — BP 112/66 | HR 96 | Temp 98.8°F | Ht 61.0 in | Wt 172.7 lb

## 2014-08-04 DIAGNOSIS — O99810 Abnormal glucose complicating pregnancy: Secondary | ICD-10-CM | POA: Insufficient documentation

## 2014-08-04 DIAGNOSIS — O24419 Gestational diabetes mellitus in pregnancy, unspecified control: Secondary | ICD-10-CM

## 2014-08-04 DIAGNOSIS — O09893 Supervision of other high risk pregnancies, third trimester: Secondary | ICD-10-CM

## 2014-08-04 LAB — POCT URINALYSIS DIP (DEVICE)
Bilirubin Urine: NEGATIVE
Glucose, UA: NEGATIVE mg/dL
KETONES UR: NEGATIVE mg/dL
LEUKOCYTES UA: NEGATIVE
NITRITE: NEGATIVE
PH: 5.5 (ref 5.0–8.0)
Protein, ur: NEGATIVE mg/dL
Specific Gravity, Urine: >=1.03 (ref 1.005–1.030)
Urobilinogen, UA: 0.2 mg/dL (ref 0.0–1.0)

## 2014-08-04 LAB — GLUCOSE, CAPILLARY: GLUCOSE-CAPILLARY: 97 mg/dL (ref 70–99)

## 2014-08-04 NOTE — Progress Notes (Signed)
G1 at [redacted]w[redacted]d. Transfer from [place]. Last visit 07/12/14. Has PL but not records.  States 1 hr was 189 and did not have 3hr which was scheduled there 07/13/14. States fasting today. CBG: 97  Schedule 3 hr OGTT asap and seenutritionist today.  Per PL has abnormal cx cytology 04/19/14 [facility]. Pt unaware. ROI records.  Reports good FM, no LOF or VB.  Neg CVAT. LBP.

## 2014-08-04 NOTE — Progress Notes (Signed)
ROI obtained and faxed to [facility] fax#[phone].

## 2014-08-04 NOTE — Progress Notes (Signed)
Pain- back  Pressure- "when he moves", lower abd New ob 25-35lbs New ob packet given  Pt declines flu and tdap vaccine Pt needs to schedule 3hr glucola test; abnormal 1 hour @ [facility] pt never went to appt scheduled on 07/13/14 @ [place] records from [facility]

## 2014-08-04 NOTE — Progress Notes (Signed)
Nutrition note: 1st visit consult Pt has gained 41.7# @ [redacted]w[redacted]d, which is > expected. Pt reports eating 3 meals & 2 snacks/d. Pt is taking a PNV. Pt reports no N&V or heartburn. Pt is allergic to fish. Pt received verbal & written education on general nutrition during pregnancy. Discussed wt gain goals of 25-35# or 1#/wk. Pt agrees to continue taking a PNV. Pt has [place] & plans to BF. F/u as needed  Vladimir Faster, MS, RD, LDN, [facility]

## 2014-08-04 NOTE — Progress Notes (Signed)
Trace hgb in urine.

## 2014-08-04 NOTE — Patient Instructions (Addendum)
Gestational Diabetes Mellitus Gestational diabetes mellitus, often simply referred to as gestational diabetes, is a type of diabetes that some women develop during pregnancy. In gestational diabetes, the pancreas does not make enough insulin (a hormone), the cells are less responsive to the insulin that is made (insulin resistance), or both.Normally, insulin moves sugars from food into the tissue cells. The tissue cells use the sugars for energy. The lack of insulin or the lack of normal response to insulin causes excess sugars to build up in the blood instead of going into the tissue cells. As a result, high blood sugar (hyperglycemia) develops. The effect of high sugar (glucose) levels can cause many problems.  RISK FACTORS You have an increased chance of developing gestational diabetes if you have a family history of diabetes and also have one or more of the following risk factors:  A body mass index over 30 (obesity).  A previous pregnancy with gestational diabetes.  An older age at the time of pregnancy. If blood glucose levels are kept in the normal range during pregnancy, women can have a healthy pregnancy. If your blood glucose levels are not well controlled, there may be risks to you, your unborn baby (fetus), your labor and delivery, or your newborn baby.  SYMPTOMS  If symptoms are experienced, they are much like symptoms you would normally expect during pregnancy. The symptoms of gestational diabetes include:   Increased thirst (polydipsia).  Increased urination (polyuria).  Increased urination during the night (nocturia).  Weight loss. This weight loss may be rapid.  Frequent, recurring infections.  Tiredness (fatigue).  Weakness.  Vision changes, such as blurred vision.  Fruity smell to your breath.  Abdominal pain. DIAGNOSIS Diabetes is diagnosed when blood glucose levels are increased. Your blood glucose level may be checked by one or more of the following blood  tests:  A fasting blood glucose test. You will not be allowed to eat for at least 8 hours before a blood sample is taken.  A random blood glucose test. Your blood glucose is checked at any time of the day regardless of when you ate.  A hemoglobin A1c blood glucose test. A hemoglobin A1c test provides information about blood glucose control over the previous 3 months.  An oral glucose tolerance test (OGTT). Your blood glucose is measured after you have not eaten (fasted) for 1-3 hours and then after you drink a glucose-containing beverage. Since the hormones that cause insulin resistance are highest at about 24-28 weeks of a pregnancy, an OGTT is usually performed during that time. If you have risk factors for gestational diabetes, your health care provider may test you for gestational diabetes earlier than 24 weeks of pregnancy. TREATMENT   You will need to take diabetes medicine or insulin daily to keep blood glucose levels in the desired range.  You will need to match insulin dosing with exercise and healthy food choices. The treatment goal is to maintain the before-meal (preprandial), bedtime, and overnight blood glucose level at 60-99 mg/dL during pregnancy. The treatment goal is to further maintain peak after-meal blood sugar (postprandial glucose) level at 100-140 mg/dL. HOME CARE INSTRUCTIONS   Have your hemoglobin A1c level checked twice a year.  Perform daily blood glucose monitoring as directed by your health care provider. It is common to perform frequent blood glucose monitoring.  Monitor urine ketones when you are ill and as directed by your health care provider.  Take your diabetes medicine and insulin as directed by your health care provider  to maintain your blood glucose level in the desired range.  Never run out of diabetes medicine or insulin. It is needed every day.  Adjust insulin based on your intake of carbohydrates. Carbohydrates can raise blood glucose levels but  need to be included in your diet. Carbohydrates provide vitamins, minerals, and fiber which are an essential part of a healthy diet. Carbohydrates are found in fruits, vegetables, whole grains, dairy products, legumes, and foods containing added sugars.  Eat healthy foods. Alternate 3 meals with 3 snacks.  Maintain a healthy weight gain. The usual total expected weight gain varies according to your prepregnancy body mass index (BMI).  Carry a medical alert card or wear your medical alert jewelry.  Carry a 15-gram carbohydrate snack with you at all times to treat low blood glucose (hypoglycemia). Some examples of 15-gram carbohydrate snacks include:  Glucose tablets, 3 or 4.  Glucose gel, 15-gram tube.  Raisins, 2 tablespoons (24 g).  Jelly beans, 6.  Animal crackers, 8.  Fruit juice, regular soda, or low-fat milk, 4 ounces (120 mL).  Gummy treats, 9.  Recognize hypoglycemia. Hypoglycemia during pregnancy occurs with blood glucose levels of 60 mg/dL and below. The risk for hypoglycemia increases when fasting or skipping meals, during or after intense exercise, and during sleep. Hypoglycemia symptoms can include:  Tremors or shakes.  Decreased ability to concentrate.  Sweating.  Increased heart rate.  Headache.  Dry mouth.  Hunger.  Irritability.  Anxiety.  Restless sleep.  Altered speech or coordination.  Confusion.  Treat hypoglycemia promptly. If you are alert and able to safely swallow, follow the 15:15 rule:  Take 15-20 grams of rapid-acting glucose or carbohydrate. Rapid-acting options include glucose gel, glucose tablets, or 4 ounces (120 mL) of fruit juice, regular soda, or low-fat milk.  Check your blood glucose level 15 minutes after taking the glucose.  Take 15-20 grams more of glucose if the repeat blood glucose level is still 70 mg/dL or below.  Eat a meal or snack within 1 hour once blood glucose levels return to normal.  Be alert to polyuria  (excess urination) and polydipsia (excess thirst) which are early signs of hyperglycemia. An early awareness of hyperglycemia allows for prompt treatment. Treat hyperglycemia as directed by your health care provider.  Engage in at least 30 minutes of physical activity a day or as directed by your health care provider. Ten minutes of physical activity timed 30 minutes after each meal is encouraged to control postprandial blood glucose levels.  Adjust your insulin dosing and food intake as needed if you start a new exercise or sport.  Follow your sick-day plan at any time you are unable to eat or drink as usual.  Avoid tobacco and alcohol use.  Keep all follow-up visits as directed by your health care provider.  Follow the advice of your health care provider regarding your prenatal and post-delivery (postpartum) appointments, meal planning, exercise, medicines, vitamins, blood tests, other medical tests, and physical activities.  Perform daily skin and foot care. Examine your skin and feet daily for cuts, bruises, redness, nail problems, bleeding, blisters, or sores.  Brush your teeth and gums at least twice a day and floss at least once a day. Follow up with your dentist regularly.  Schedule an eye exam during the first trimester of your pregnancy or as directed by your health care provider.  Share your diabetes management plan with your workplace or school.  Stay up-to-date with immunizations.  Learn to manage stress.    Obtain ongoing diabetes education and support as needed.  Learn about and consider breastfeeding your baby.  You should have your blood sugar level checked 6-12 weeks after delivery. This is done with an oral glucose tolerance test (OGTT). SEEK MEDICAL CARE IF:   You are unable to eat food or drink fluids for more than 6 hours.  You have nausea and vomiting for more than 6 hours.  You have a blood glucose level of 200 mg/dL and you have ketones in your  urine.  There is a change in mental status.  You develop vision problems.  You have a persistent headache.  You have upper abdominal pain or discomfort.  You develop an additional serious illness.  You have diarrhea for more than 6 hours.  You have been sick or have had a fever for a couple of days and are not getting better. SEEK IMMEDIATE MEDICAL CARE IF:   You have difficulty breathing.  You no longer feel the baby moving.  You are bleeding or have discharge from your vagina.  You start having premature contractions or labor. MAKE SURE YOU:  Understand these instructions.  Will watch your condition.  Will get help right away if you are not doing well or get worse. Document Released: 07/29/2000 Document Revised: 09/06/2013 Document Reviewed: 11/19/2011 [facility] Patient Information 2015 [place], [place]. This information is not intended to replace advice given to you by your health care provider. Make sure you discuss any questions you have with your health care provider. Third Trimester of Pregnancy The third trimester is from week 29 through week 42, months 7 through 9. The third trimester is a time when the fetus is growing rapidly. At the end of the ninth month, the fetus is about 20 inches in length and weighs 6-10 pounds.  BODY CHANGES Your body goes through many changes during pregnancy. The changes vary from woman to woman.   Your weight will continue to increase. You can expect to gain 25-35 pounds (11-16 kg) by the end of the pregnancy.  You may begin to get stretch marks on your hips, abdomen, and breasts.  You may urinate more often because the fetus is moving lower into your pelvis and pressing on your bladder.  You may develop or continue to have heartburn as a result of your pregnancy.  You may develop constipation because certain hormones are causing the muscles that push waste through your intestines to slow down.  You may develop hemorrhoids or  swollen, bulging veins (varicose veins).  You may have pelvic pain because of the weight gain and pregnancy hormones relaxing your joints between the bones in your pelvis. Backaches may result from overexertion of the muscles supporting your posture.  You may have changes in your hair. These can include thickening of your hair, rapid growth, and changes in texture. Some women also have hair loss during or after pregnancy, or hair that feels dry or thin. Your hair will most likely return to normal after your baby is born.  Your breasts will continue to grow and be tender. A yellow discharge may leak from your breasts called colostrum.  Your belly button may stick out.  You may feel short of breath because of your expanding uterus.  You may notice the fetus "dropping," or moving lower in your abdomen.  You may have a bloody mucus discharge. This usually occurs a few days to a week before labor begins.  Your cervix becomes thin and soft (effaced) near your due date.  WHAT TO EXPECT AT YOUR PRENATAL EXAMS  You will have prenatal exams every 2 weeks until week 36. Then, you will have weekly prenatal exams. During a routine prenatal visit:  You will be weighed to make sure you and the fetus are growing normally.  Your blood pressure is taken.  Your abdomen will be measured to track your baby's growth.  The fetal heartbeat will be listened to.  Any test results from the previous visit will be discussed.  You may have a cervical check near your due date to see if you have effaced. At around 36 weeks, your caregiver will check your cervix. At the same time, your caregiver will also perform a test on the secretions of the vaginal tissue. This test is to determine if a type of bacteria, Group B streptococcus, is present. Your caregiver will explain this further. Your caregiver may ask you:  What your birth plan is.  How you are feeling.  If you are feeling the baby move.  If you have had  any abnormal symptoms, such as leaking fluid, bleeding, severe headaches, or abdominal cramping.  If you have any questions. Other tests or screenings that may be performed during your third trimester include:  Blood tests that check for low iron levels (anemia).  Fetal testing to check the health, activity level, and growth of the fetus. Testing is done if you have certain medical conditions or if there are problems during the pregnancy. FALSE LABOR You may feel small, irregular contractions that eventually go away. These are called Braxton Hicks contractions, or false labor. Contractions may last for hours, days, or even weeks before true labor sets in. If contractions come at regular intervals, intensify, or become painful, it is best to be seen by your caregiver.  SIGNS OF LABOR   Menstrual-like cramps.  Contractions that are 5 minutes apart or less.  Contractions that start on the top of the uterus and spread down to the lower abdomen and back.  A sense of increased pelvic pressure or back pain.  A watery or bloody mucus discharge that comes from the vagina. If you have any of these signs before the 37th week of pregnancy, call your caregiver right away. You need to go to the hospital to get checked immediately. HOME CARE INSTRUCTIONS   Avoid all smoking, herbs, alcohol, and unprescribed drugs. These chemicals affect the formation and growth of the baby.  Follow your caregiver's instructions regarding medicine use. There are medicines that are either safe or unsafe to take during pregnancy.  Exercise only as directed by your caregiver. Experiencing uterine cramps is a good sign to stop exercising.  Continue to eat regular, healthy meals.  Wear a good support bra for breast tenderness.  Do not use hot tubs, steam rooms, or saunas.  Wear your seat belt at all times when driving.  Avoid raw meat, uncooked cheese, cat litter boxes, and soil used by cats. These carry germs that  can cause birth defects in the baby.  Take your prenatal vitamins.  Try taking a stool softener (if your caregiver approves) if you develop constipation. Eat more high-fiber foods, such as fresh vegetables or fruit and whole grains. Drink plenty of fluids to keep your urine clear or pale yellow.  Take warm sitz baths to soothe any pain or discomfort caused by hemorrhoids. Use hemorrhoid cream if your caregiver approves.  If you develop varicose veins, wear support hose. Elevate your feet for 15 minutes, 3-4 times a day.  Limit salt in your diet.  Avoid heavy lifting, wear low heal shoes, and practice good posture.  Rest a lot with your legs elevated if you have leg cramps or low back pain.  Visit your dentist if you have not gone during your pregnancy. Use a soft toothbrush to brush your teeth and be gentle when you floss.  A sexual relationship may be continued unless your caregiver directs you otherwise.  Do not travel far distances unless it is absolutely necessary and only with the approval of your caregiver.  Take prenatal classes to understand, practice, and ask questions about the labor and delivery.  Make a trial run to the hospital.  Pack your hospital bag.  Prepare the baby's nursery.  Continue to go to all your prenatal visits as directed by your caregiver. SEEK MEDICAL CARE IF:  You are unsure if you are in labor or if your water has broken.  You have dizziness.  You have mild pelvic cramps, pelvic pressure, or nagging pain in your abdominal area.  You have persistent nausea, vomiting, or diarrhea.  You have a bad smelling vaginal discharge.  You have pain with urination. SEEK IMMEDIATE MEDICAL CARE IF:   You have a fever.  You are leaking fluid from your vagina.  You have spotting or bleeding from your vagina.  You have severe abdominal cramping or pain.  You have rapid weight loss or gain.  You have shortness of breath with chest pain.  You notice  sudden or extreme swelling of your face, hands, ankles, feet, or legs.  You have not felt your baby move in over an hour.  You have severe headaches that do not go away with medicine.  You have vision changes. Document Released: 04/16/2001 Document Revised: 04/27/2013 Document Reviewed: 06/23/2012 [facility] Patient Information 2015 [place], [place]. This information is not intended to replace advice given to you by your health care provider. Make sure you discuss any questions you have with your health care provider. Contraception Choices Contraception (birth control) is the use of any methods or devices to prevent pregnancy. Below are some methods to help avoid pregnancy. HORMONAL METHODS   Contraceptive implant. This is a thin, plastic tube containing progesterone hormone. It does not contain estrogen hormone. Your health care provider inserts the tube in the inner part of the upper arm. The tube can remain in place for up to 3 years. After 3 years, the implant must be removed. The implant prevents the ovaries from releasing an egg (ovulation), thickens the cervical mucus to prevent sperm from entering the uterus, and thins the lining of the inside of the uterus.  Progesterone-only injections. These injections are given every 3 months by your health care provider to prevent pregnancy. This synthetic progesterone hormone stops the ovaries from releasing eggs. It also thickens cervical mucus and changes the uterine lining. This makes it harder for sperm to survive in the uterus.  Birth control pills. These pills contain estrogen and progesterone hormone. They work by preventing the ovaries from releasing eggs (ovulation). They also cause the cervical mucus to thicken, preventing the sperm from entering the uterus. Birth control pills are prescribed by a health care provider.Birth control pills can also be used to treat heavy periods.  Minipill. This type of birth control pill contains only the  progesterone hormone. They are taken every day of each month and must be prescribed by your health care provider.  Birth control patch. The patch contains hormones similar to those in birth  control pills. It must be changed once a week and is prescribed by a health care provider.  Vaginal ring. The ring contains hormones similar to those in birth control pills. It is left in the vagina for 3 weeks, removed for 1 week, and then a new one is put back in place. The patient must be comfortable inserting and removing the ring from the vagina.A health care provider's prescription is necessary.  Emergency contraception. Emergency contraceptives prevent pregnancy after unprotected sexual intercourse. This pill can be taken right after sex or up to 5 days after unprotected sex. It is most effective the sooner you take the pills after having sexual intercourse. Most emergency contraceptive pills are available without a prescription. Check with your pharmacist. Do not use emergency contraception as your only form of birth control. BARRIER METHODS   Female condom. This is a thin sheath (latex or rubber) that is worn over the penis during sexual intercourse. It can be used with spermicide to increase effectiveness.  Female condom. This is a soft, loose-fitting sheath that is put into the vagina before sexual intercourse.  Diaphragm. This is a soft, latex, dome-shaped barrier that must be fitted by a health care provider. It is inserted into the vagina, along with a spermicidal jelly. It is inserted before intercourse. The diaphragm should be left in the vagina for 6 to 8 hours after intercourse.  Cervical cap. This is a round, soft, latex or plastic cup that fits over the cervix and must be fitted by a health care provider. The cap can be left in place for up to 48 hours after intercourse.  Sponge. This is a soft, circular piece of polyurethane foam. The sponge has spermicide in it. It is inserted into the vagina  after wetting it and before sexual intercourse.  Spermicides. These are chemicals that kill or block sperm from entering the cervix and uterus. They come in the form of creams, jellies, suppositories, foam, or tablets. They do not require a prescription. They are inserted into the vagina with an applicator before having sexual intercourse. The process must be repeated every time you have sexual intercourse. INTRAUTERINE CONTRACEPTION  Intrauterine device (IUD). This is a T-shaped device that is put in a woman's uterus during a menstrual period to prevent pregnancy. There are 2 types:  Copper IUD. This type of IUD is wrapped in copper wire and is placed inside the uterus. Copper makes the uterus and fallopian tubes produce a fluid that kills sperm. It can stay in place for 10 years.  Hormone IUD. This type of IUD contains the hormone progestin (synthetic progesterone). The hormone thickens the cervical mucus and prevents sperm from entering the uterus, and it also thins the uterine lining to prevent implantation of a fertilized egg. The hormone can weaken or kill the sperm that get into the uterus. It can stay in place for 3-5 years, depending on which type of IUD is used. PERMANENT METHODS OF CONTRACEPTION  Female tubal ligation. This is when the woman's fallopian tubes are surgically sealed, tied, or blocked to prevent the egg from traveling to the uterus.  Hysteroscopic sterilization. This involves placing a small coil or insert into each fallopian tube. Your doctor uses a technique called hysteroscopy to do the procedure. The device causes scar tissue to form. This results in permanent blockage of the fallopian tubes, so the sperm cannot fertilize the egg. It takes about 3 months after the procedure for the tubes to become blocked. You must use  another form of birth control for these 3 months.  Female sterilization. This is when the female has the tubes that carry sperm tied off (vasectomy).This  blocks sperm from entering the vagina during sexual intercourse. After the procedure, the man can still ejaculate fluid (semen). NATURAL PLANNING METHODS  Natural family planning. This is not having sexual intercourse or using a barrier method (condom, diaphragm, cervical cap) on days the woman could become pregnant.  Calendar method. This is keeping track of the length of each menstrual cycle and identifying when you are fertile.  Ovulation method. This is avoiding sexual intercourse during ovulation.  Symptothermal method. This is avoiding sexual intercourse during ovulation, using a thermometer and ovulation symptoms.  Post-ovulation method. This is timing sexual intercourse after you have ovulated. Regardless of which type or method of contraception you choose, it is important that you use condoms to protect against the transmission of sexually transmitted infections (STIs). Talk with your health care provider about which form of contraception is most appropriate for you. Document Released: 04/22/2005 Document Revised: 04/27/2013 Document Reviewed: 10/15/2012 [facility] Patient Information 2015 [place], [place]. This information is not intended to replace advice given to you by your health care provider. Make sure you discuss any questions you have with your health care provider.

## 2014-08-05 ENCOUNTER — Other Ambulatory Visit: Payer: Medicaid Other

## 2014-08-06 LAB — GLUCOSE TOLERANCE, 3 HOURS
GLUCOSE 3 HOUR GTT: 159 mg/dL — AB (ref 70–144)
GLUCOSE, FASTING-GESTATIONAL: 103 mg/dL (ref 70–104)
Glucose Tolerance, 1 hour: 226 mg/dL — ABNORMAL HIGH (ref 70–189)
Glucose Tolerance, 2 hour: 151 mg/dL (ref 70–164)

## 2014-08-09 DIAGNOSIS — O24419 Gestational diabetes mellitus in pregnancy, unspecified control: Secondary | ICD-10-CM | POA: Insufficient documentation

## 2014-08-10 ENCOUNTER — Encounter: Payer: Medicaid Other | Attending: Physician Assistant

## 2014-08-10 ENCOUNTER — Encounter: Payer: Self-pay | Admitting: Physician Assistant

## 2014-08-10 ENCOUNTER — Encounter: Payer: Self-pay | Admitting: *Deleted

## 2014-08-10 ENCOUNTER — Ambulatory Visit (INDEPENDENT_AMBULATORY_CARE_PROVIDER_SITE_OTHER): Payer: Medicaid Other | Admitting: Physician Assistant

## 2014-08-10 VITALS — BP 117/71 | HR 101 | Temp 98.3°F | Wt 177.3 lb

## 2014-08-10 VITALS — Ht 61.0 in | Wt 179.5 lb

## 2014-08-10 DIAGNOSIS — Z3493 Encounter for supervision of normal pregnancy, unspecified, third trimester: Secondary | ICD-10-CM | POA: Diagnosis not present

## 2014-08-10 DIAGNOSIS — Z713 Dietary counseling and surveillance: Secondary | ICD-10-CM | POA: Insufficient documentation

## 2014-08-10 DIAGNOSIS — O24410 Gestational diabetes mellitus in pregnancy, diet controlled: Secondary | ICD-10-CM

## 2014-08-10 DIAGNOSIS — O24419 Gestational diabetes mellitus in pregnancy, unspecified control: Secondary | ICD-10-CM

## 2014-08-10 DIAGNOSIS — O99810 Abnormal glucose complicating pregnancy: Secondary | ICD-10-CM

## 2014-08-10 LAB — POCT URINALYSIS DIP (DEVICE)
BILIRUBIN URINE: NEGATIVE
GLUCOSE, UA: NEGATIVE mg/dL
KETONES UR: NEGATIVE mg/dL
NITRITE: NEGATIVE
Protein, ur: NEGATIVE mg/dL
Specific Gravity, Urine: 1.025 (ref 1.005–1.030)
Urobilinogen, UA: 0.2 mg/dL (ref 0.0–1.0)
pH: 5.5 (ref 5.0–8.0)

## 2014-08-10 MED ORDER — GLUCOSE BLOOD VI STRP: ORAL_STRIP | Status: DC

## 2014-08-10 MED ORDER — ACCU-CHEK NANO SMARTVIEW W/DEVICE KIT: 1.0000 | PACK | Freq: Four times a day (QID) | Status: DC

## 2014-08-10 MED ORDER — ACCU-CHEK FASTCLIX LANCETS MISC: 1.0000 | Freq: Four times a day (QID) | Status: DC

## 2014-08-10 NOTE — Patient Instructions (Signed)
Gestational Diabetes Mellitus Gestational diabetes mellitus, often simply referred to as gestational diabetes, is a type of diabetes that some women develop during pregnancy. In gestational diabetes, the pancreas does not make enough insulin (a hormone), the cells are less responsive to the insulin that is made (insulin resistance), or both.Normally, insulin moves sugars from food into the tissue cells. The tissue cells use the sugars for energy. The lack of insulin or the lack of normal response to insulin causes excess sugars to build up in the blood instead of going into the tissue cells. As a result, high blood sugar (hyperglycemia) develops. The effect of high sugar (glucose) levels can cause many problems.  RISK FACTORS You have an increased chance of developing gestational diabetes if you have a family history of diabetes and also have one or more of the following risk factors:  A body mass index over 30 (obesity).  A previous pregnancy with gestational diabetes.  An older age at the time of pregnancy. If blood glucose levels are kept in the normal range during pregnancy, women can have a healthy pregnancy. If your blood glucose levels are not well controlled, there may be risks to you, your unborn baby (fetus), your labor and delivery, or your newborn baby.  SYMPTOMS  If symptoms are experienced, they are much like symptoms you would normally expect during pregnancy. The symptoms of gestational diabetes include:   Increased thirst (polydipsia).  Increased urination (polyuria).  Increased urination during the night (nocturia).  Weight loss. This weight loss may be rapid.  Frequent, recurring infections.  Tiredness (fatigue).  Weakness.  Vision changes, such as blurred vision.  Fruity smell to your breath.  Abdominal pain. DIAGNOSIS Diabetes is diagnosed when blood glucose levels are increased. Your blood glucose level may be checked by one or more of the following blood  tests:  A fasting blood glucose test. You will not be allowed to eat for at least 8 hours before a blood sample is taken.  A random blood glucose test. Your blood glucose is checked at any time of the day regardless of when you ate.  A hemoglobin A1c blood glucose test. A hemoglobin A1c test provides information about blood glucose control over the previous 3 months.  An oral glucose tolerance test (OGTT). Your blood glucose is measured after you have not eaten (fasted) for 1-3 hours and then after you drink a glucose-containing beverage. Since the hormones that cause insulin resistance are highest at about 24-28 weeks of a pregnancy, an OGTT is usually performed during that time. If you have risk factors for gestational diabetes, your health care provider may test you for gestational diabetes earlier than 24 weeks of pregnancy. TREATMENT   You will need to take diabetes medicine or insulin daily to keep blood glucose levels in the desired range.  You will need to match insulin dosing with exercise and healthy food choices. The treatment goal is to maintain the before-meal (preprandial), bedtime, and overnight blood glucose level at 60-99 mg/dL during pregnancy. The treatment goal is to further maintain peak after-meal blood sugar (postprandial glucose) level at 100-140 mg/dL. HOME CARE INSTRUCTIONS   Have your hemoglobin A1c level checked twice a year.  Perform daily blood glucose monitoring as directed by your health care provider. It is common to perform frequent blood glucose monitoring.  Monitor urine ketones when you are ill and as directed by your health care provider.  Take your diabetes medicine and insulin as directed by your health care provider  to maintain your blood glucose level in the desired range.  Never run out of diabetes medicine or insulin. It is needed every day.  Adjust insulin based on your intake of carbohydrates. Carbohydrates can raise blood glucose levels but  need to be included in your diet. Carbohydrates provide vitamins, minerals, and fiber which are an essential part of a healthy diet. Carbohydrates are found in fruits, vegetables, whole grains, dairy products, legumes, and foods containing added sugars.  Eat healthy foods. Alternate 3 meals with 3 snacks.  Maintain a healthy weight gain. The usual total expected weight gain varies according to your prepregnancy body mass index (BMI).  Carry a medical alert card or wear your medical alert jewelry.  Carry a 15-gram carbohydrate snack with you at all times to treat low blood glucose (hypoglycemia). Some examples of 15-gram carbohydrate snacks include:  Glucose tablets, 3 or 4.  Glucose gel, 15-gram tube.  Raisins, 2 tablespoons (24 g).  Jelly beans, 6.  Animal crackers, 8.  Fruit juice, regular soda, or low-fat milk, 4 ounces (120 mL).  Gummy treats, 9.  Recognize hypoglycemia. Hypoglycemia during pregnancy occurs with blood glucose levels of 60 mg/dL and below. The risk for hypoglycemia increases when fasting or skipping meals, during or after intense exercise, and during sleep. Hypoglycemia symptoms can include:  Tremors or shakes.  Decreased ability to concentrate.  Sweating.  Increased heart rate.  Headache.  Dry mouth.  Hunger.  Irritability.  Anxiety.  Restless sleep.  Altered speech or coordination.  Confusion.  Treat hypoglycemia promptly. If you are alert and able to safely swallow, follow the 15:15 rule:  Take 15-20 grams of rapid-acting glucose or carbohydrate. Rapid-acting options include glucose gel, glucose tablets, or 4 ounces (120 mL) of fruit juice, regular soda, or low-fat milk.  Check your blood glucose level 15 minutes after taking the glucose.  Take 15-20 grams more of glucose if the repeat blood glucose level is still 70 mg/dL or below.  Eat a meal or snack within 1 hour once blood glucose levels return to normal.  Be alert to polyuria  (excess urination) and polydipsia (excess thirst) which are early signs of hyperglycemia. An early awareness of hyperglycemia allows for prompt treatment. Treat hyperglycemia as directed by your health care provider.  Engage in at least 30 minutes of physical activity a day or as directed by your health care provider. Ten minutes of physical activity timed 30 minutes after each meal is encouraged to control postprandial blood glucose levels.  Adjust your insulin dosing and food intake as needed if you start a new exercise or sport.  Follow your sick-day plan at any time you are unable to eat or drink as usual.  Avoid tobacco and alcohol use.  Keep all follow-up visits as directed by your health care provider.  Follow the advice of your health care provider regarding your prenatal and post-delivery (postpartum) appointments, meal planning, exercise, medicines, vitamins, blood tests, other medical tests, and physical activities.  Perform daily skin and foot care. Examine your skin and feet daily for cuts, bruises, redness, nail problems, bleeding, blisters, or sores.  Brush your teeth and gums at least twice a day and floss at least once a day. Follow up with your dentist regularly.  Schedule an eye exam during the first trimester of your pregnancy or as directed by your health care provider.  Share your diabetes management plan with your workplace or school.  Stay up-to-date with immunizations.  Learn to manage stress.    Obtain ongoing diabetes education and support as needed.  Learn about and consider breastfeeding your baby.  You should have your blood sugar level checked 6-12 weeks after delivery. This is done with an oral glucose tolerance test (OGTT). SEEK MEDICAL CARE IF:   You are unable to eat food or drink fluids for more than 6 hours.  You have nausea and vomiting for more than 6 hours.  You have a blood glucose level of 200 mg/dL and you have ketones in your  urine.  There is a change in mental status.  You develop vision problems.  You have a persistent headache.  You have upper abdominal pain or discomfort.  You develop an additional serious illness.  You have diarrhea for more than 6 hours.  You have been sick or have had a fever for a couple of days and are not getting better. SEEK IMMEDIATE MEDICAL CARE IF:   You have difficulty breathing.  You no longer feel the baby moving.  You are bleeding or have discharge from your vagina.  You start having premature contractions or labor. MAKE SURE YOU:  Understand these instructions.  Will watch your condition.  Will get help right away if you are not doing well or get worse. Document Released: 07/29/2000 Document Revised: 09/06/2013 Document Reviewed: 11/19/2011 [facility] Patient Information 2015 [place], [place]. This information is not intended to replace advice given to you by your health care provider. Make sure you discuss any questions you have with your health care provider.

## 2014-08-10 NOTE — Progress Notes (Signed)
Pt has question concerning hair dye

## 2014-08-10 NOTE — Progress Notes (Signed)
33 weeks without complaint. Endorses good fetal movement.  Denies LOF, dysuria, VB.   Informed of elevated 3 hour GTT - GDM.  Pt to be schedule for diabetes educator.  Transfer to [facility].  Diabetes supplies ordered.  Will obtain regular readings of blood sugars and then determine management plan.

## 2014-08-11 ENCOUNTER — Telehealth: Payer: Self-pay | Admitting: *Deleted

## 2014-08-11 ENCOUNTER — Encounter: Payer: Self-pay | Admitting: Obstetrics & Gynecology

## 2014-08-11 DIAGNOSIS — O24419 Gestational diabetes mellitus in pregnancy, unspecified control: Secondary | ICD-10-CM

## 2014-08-11 DIAGNOSIS — IMO0002 Reserved for concepts with insufficient information to code with codable children: Secondary | ICD-10-CM | POA: Insufficient documentation

## 2014-08-11 NOTE — Telephone Encounter (Signed)
-----   Message from Lavonia Drafts, MD sent at 08/11/2014  3:23 PM EDT ----- This should be attached to the prev msg for this pt:  Found anatomy scan from [facility].  Please obtain limited sono for growth/size.  Thx, clh-S

## 2014-08-11 NOTE — Telephone Encounter (Signed)
Attempted to contact patient to inform of Ultrasound appointment on 4/11 @ 0900.  No answer, left message for patient to contact the clinic for information concerning ultrasound appointment.

## 2014-08-15 ENCOUNTER — Ambulatory Visit (HOSPITAL_COMMUNITY): Payer: Medicaid Other

## 2014-08-15 ENCOUNTER — Other Ambulatory Visit: Payer: Medicaid Other

## 2014-08-15 ENCOUNTER — Ambulatory Visit (INDEPENDENT_AMBULATORY_CARE_PROVIDER_SITE_OTHER): Payer: Medicaid Other | Admitting: Obstetrics and Gynecology

## 2014-08-15 VITALS — BP 122/64 | HR 104 | Temp 98.5°F | Wt 175.1 lb

## 2014-08-15 DIAGNOSIS — R896 Abnormal cytological findings in specimens from other organs, systems and tissues: Secondary | ICD-10-CM

## 2014-08-15 DIAGNOSIS — O24410 Gestational diabetes mellitus in pregnancy, diet controlled: Secondary | ICD-10-CM

## 2014-08-15 DIAGNOSIS — IMO0002 Reserved for concepts with insufficient information to code with codable children: Secondary | ICD-10-CM

## 2014-08-15 LAB — POCT URINALYSIS DIP (DEVICE)
GLUCOSE, UA: NEGATIVE mg/dL
KETONES UR: 15 mg/dL — AB
Nitrite: NEGATIVE
Protein, ur: 30 mg/dL — AB
Specific Gravity, Urine: >=1.03 (ref 1.005–1.030)
Urobilinogen, UA: 0.2 mg/dL (ref 0.0–1.0)
pH: 5.5 (ref 5.0–8.0)

## 2014-08-15 NOTE — Progress Notes (Signed)
Growth U/S rescheduled for 09/12/14 @ 915a with Radiology.

## 2014-08-15 NOTE — Progress Notes (Signed)
29 y.o. G1P0 at [redacted]w[redacted]d here for routine OB visit. No concerns today.  1. Newly diagnosed GDM. Has been checking blood glucose values. Mostly at goal without medications. Counseled on diet/exercise. Will see nutrition today. Does not need growth ultrasound today 2. Routine PNC. Labs reviewed. FM/PTL precautions reviewed. RTC 1 week.

## 2014-08-15 NOTE — Progress Notes (Signed)
  Patient was seen on 08/10/14 for Gestational Diabetes self-management . The following learning objectives were met by the patient :   States the definition of Gestational Diabetes  States why dietary management is important in controlling blood glucose  Describes the effects of carbohydrates on blood glucose levels  Demonstrates ability to create a balanced meal plan  Demonstrates carbohydrate counting   States when to check blood glucose levels  Demonstrates proper blood glucose monitoring techniques  States the effect of stress and exercise on blood glucose levels  States the importance of limiting caffeine and abstaining from alcohol and smoking  Plan:  Aim for 2 Carb Choices per meal (30 grams) +/- 1 either way for breakfast Aim for 3 Carb Choices per meal (45 grams) +/- 1 either way from lunch and dinner Aim for 1-2 Carbs per snack Begin reading food labels for Total Carbohydrate and sugar grams of foods Consider  increasing your activity level by walking daily as tolerated Begin checking BG before breakfast and 2 hours after first bit of breakfast, lunch and dinner after  as directed by MD  Take medication  as directed by MD  Blood glucose monitor given: Accy-Chek Nano Lot # [id number] Exp: 05/06/15 Blood glucose reading: 113  Patient instructed to monitor glucose levels: FBS: 60 - <90 2 hour: <120  Patient received the following handouts:  Nutrition Diabetes and Pregnancy  Carbohydrate Counting List  Meal Planning worksheet  Patient will be seen for follow-up as needed.

## 2014-08-15 NOTE — Progress Notes (Signed)
Nutrition note: GDM diet f/u Pt has GDM & received education from GDM class. Pt has gained 44.1# @ 34w, which is > expected. Pt has been checking her BS 6x/d (before & 2hr after each meal). Explained to pt that she only needs to check 4x/d (fasting in am & then 2hr after each meal). Fasting: 87-91; 2hr pp: 94-140 (all but 1 were 121 or less) Pt reports she has decreased her rice & sweet intake. (Pt reports that the 140 after dinner was after she ate some meat, rice & strawberries and so has not eaten rice since then & all her 2hr pp BS have been wnl since then. Provided pt with measuring cups & more education on portion sizes. Encouraged pt to try eating no more than 1/3c of rice with meals & see how her BS are after that. Pt reports no ?s at this time. F/u in 1-2 wks Vladimir Faster, MS, RD, LDN, [facility]

## 2014-08-15 NOTE — Telephone Encounter (Signed)
Patient's ultrasound appt was rescheduled today in clinic

## 2014-08-15 NOTE — Progress Notes (Signed)
Pt has growth ultrasound at 0900.

## 2014-08-16 ENCOUNTER — Telehealth: Payer: Self-pay | Admitting: *Deleted

## 2014-08-16 NOTE — Telephone Encounter (Signed)
Called pt and left message stating that she will not need clinic appt as scheduled on 4/14. This is because she was seen yesterday and doctor felt things were going well. Her next appt is 4/18 @ 0945.

## 2014-08-18 ENCOUNTER — Other Ambulatory Visit: Payer: Medicaid Other

## 2014-08-22 ENCOUNTER — Other Ambulatory Visit: Payer: Medicaid Other

## 2014-08-22 ENCOUNTER — Encounter: Payer: Medicaid Other | Admitting: Obstetrics & Gynecology

## 2014-08-22 ENCOUNTER — Ambulatory Visit (INDEPENDENT_AMBULATORY_CARE_PROVIDER_SITE_OTHER): Payer: Medicaid Other | Admitting: Obstetrics and Gynecology

## 2014-08-22 VITALS — BP 120/70 | HR 91 | Temp 98.1°F | Wt 176.9 lb

## 2014-08-22 DIAGNOSIS — Z3493 Encounter for supervision of normal pregnancy, unspecified, third trimester: Secondary | ICD-10-CM

## 2014-08-22 DIAGNOSIS — O99810 Abnormal glucose complicating pregnancy: Secondary | ICD-10-CM

## 2014-08-22 LAB — POCT URINALYSIS DIP (DEVICE)
Bilirubin Urine: NEGATIVE
Glucose, UA: 100 mg/dL — AB
KETONES UR: NEGATIVE mg/dL
Leukocytes, UA: NEGATIVE
Nitrite: NEGATIVE
PH: 6 (ref 5.0–8.0)
PROTEIN: NEGATIVE mg/dL
Specific Gravity, Urine: 1.015 (ref 1.005–1.030)
Urobilinogen, UA: 0.2 mg/dL (ref 0.0–1.0)

## 2014-08-22 NOTE — Progress Notes (Signed)
29 y.o. G1P0 at [redacted]w[redacted]d here for routine OB visit.  1. A1GDM. Reviewed blood glucose log and mostly at goal. Counseled on diet/exercise. Will need growth ultrasound at 38 weeks.  2. Routine PNC. Labs reviewed. Will need 36 week cultures at next visit. FM/PTL precautions reviewed. RTC 1 week.

## 2014-08-22 NOTE — Progress Notes (Signed)
Trace blood in urine.  

## 2014-08-29 ENCOUNTER — Ambulatory Visit (INDEPENDENT_AMBULATORY_CARE_PROVIDER_SITE_OTHER): Payer: Medicaid Other | Admitting: Obstetrics and Gynecology

## 2014-08-29 ENCOUNTER — Encounter: Payer: Self-pay | Admitting: *Deleted

## 2014-08-29 ENCOUNTER — Encounter: Payer: Self-pay | Admitting: Obstetrics and Gynecology

## 2014-08-29 ENCOUNTER — Other Ambulatory Visit: Payer: Self-pay | Admitting: Obstetrics and Gynecology

## 2014-08-29 VITALS — BP 106/59 | HR 89 | Temp 98.1°F | Wt 177.0 lb

## 2014-08-29 DIAGNOSIS — O24410 Gestational diabetes mellitus in pregnancy, diet controlled: Secondary | ICD-10-CM

## 2014-08-29 DIAGNOSIS — IMO0002 Reserved for concepts with insufficient information to code with codable children: Secondary | ICD-10-CM

## 2014-08-29 DIAGNOSIS — R896 Abnormal cytological findings in specimens from other organs, systems and tissues: Secondary | ICD-10-CM

## 2014-08-29 DIAGNOSIS — O99810 Abnormal glucose complicating pregnancy: Secondary | ICD-10-CM

## 2014-08-29 LAB — POCT URINALYSIS DIP (DEVICE)
Bilirubin Urine: NEGATIVE
GLUCOSE, UA: NEGATIVE mg/dL
LEUKOCYTES UA: NEGATIVE
Nitrite: NEGATIVE
Protein, ur: NEGATIVE mg/dL
SPECIFIC GRAVITY, URINE: 1.015 (ref 1.005–1.030)
Urobilinogen, UA: 0.2 mg/dL (ref 0.0–1.0)
pH: 5.5 (ref 5.0–8.0)

## 2014-08-29 LAB — OB RESULTS CONSOLE GC/CHLAMYDIA
CHLAMYDIA, DNA PROBE: NEGATIVE
Gonorrhea: NEGATIVE

## 2014-08-29 LAB — OB RESULTS CONSOLE GBS: GBS: NEGATIVE

## 2014-08-29 NOTE — Progress Notes (Signed)
Trace ketones and trace blood in urine.

## 2014-08-29 NOTE — Progress Notes (Signed)
Patient is doing well without complaints. FM/labor precautions reviewed. CBGs all within range. Continue diet Cultures collected

## 2014-08-30 LAB — GC/CHLAMYDIA PROBE AMP
CT PROBE, AMP APTIMA: NEGATIVE
GC Probe RNA: NEGATIVE

## 2014-08-31 LAB — PRENATAL PROFILE (SOLSTAS)
Antibody Screen: NEGATIVE
BASOS ABS: 0 10*3/uL (ref 0.0–0.1)
Basophils Relative: 0 % (ref 0–1)
EOS ABS: 0.1 10*3/uL (ref 0.0–0.7)
EOS PCT: 1 % (ref 0–5)
HCT: 39.7 % (ref 36.0–46.0)
HEMOGLOBIN: 13.4 g/dL (ref 12.0–15.0)
HIV 1&2 Ab, 4th Generation: NONREACTIVE
Hepatitis B Surface Ag: NEGATIVE
Lymphocytes Relative: 15 % (ref 12–46)
Lymphs Abs: 1.9 10*3/uL (ref 0.7–4.0)
MCH: 28.6 pg (ref 26.0–34.0)
MCHC: 33.8 g/dL (ref 30.0–36.0)
MCV: 84.8 fL (ref 78.0–100.0)
MPV: 10.4 fL (ref 8.6–12.4)
Monocytes Absolute: 0.6 10*3/uL (ref 0.1–1.0)
Monocytes Relative: 5 % (ref 3–12)
NEUTROS ABS: 10.1 10*3/uL — AB (ref 1.7–7.7)
Neutrophils Relative %: 79 % — ABNORMAL HIGH (ref 43–77)
Platelets: 155 10*3/uL (ref 150–400)
RBC: 4.68 MIL/uL (ref 3.87–5.11)
RDW: 14.1 % (ref 11.5–15.5)
RH TYPE: POSITIVE
Rubella: 2.09 Index — ABNORMAL HIGH (ref <0.90)
WBC: 12.8 10*3/uL — ABNORMAL HIGH (ref 4.0–10.5)

## 2014-08-31 LAB — CULTURE, OB URINE
Colony Count: NO GROWTH
Organism ID, Bacteria: NO GROWTH

## 2014-08-31 LAB — CULTURE, BETA STREP (GROUP B ONLY)

## 2014-09-05 ENCOUNTER — Ambulatory Visit (INDEPENDENT_AMBULATORY_CARE_PROVIDER_SITE_OTHER): Payer: Medicaid Other | Admitting: Obstetrics & Gynecology

## 2014-09-05 VITALS — BP 107/64 | HR 96 | Temp 98.1°F | Wt 179.8 lb

## 2014-09-05 DIAGNOSIS — O99810 Abnormal glucose complicating pregnancy: Secondary | ICD-10-CM

## 2014-09-05 DIAGNOSIS — O24410 Gestational diabetes mellitus in pregnancy, diet controlled: Secondary | ICD-10-CM

## 2014-09-05 LAB — POCT URINALYSIS DIP (DEVICE)
Bilirubin Urine: NEGATIVE
Glucose, UA: NEGATIVE mg/dL
KETONES UR: NEGATIVE mg/dL
LEUKOCYTES UA: NEGATIVE
Nitrite: NEGATIVE
Protein, ur: NEGATIVE mg/dL
Specific Gravity, Urine: 1.025 (ref 1.005–1.030)
Urobilinogen, UA: 0.2 mg/dL (ref 0.0–1.0)
pH: 6 (ref 5.0–8.0)

## 2014-09-05 NOTE — Patient Instructions (Signed)
Return to clinic for any obstetric concerns or go to [facility] for evaluation  

## 2014-09-05 NOTE — Progress Notes (Signed)
Blood sugars are within range.  Growth scan already scheduled at 38 weeks. No other complaints or concerns.  Labor and fetal movement precautions reviewed.

## 2014-09-12 ENCOUNTER — Other Ambulatory Visit: Payer: Self-pay | Admitting: Obstetrics and Gynecology

## 2014-09-12 ENCOUNTER — Ambulatory Visit (INDEPENDENT_AMBULATORY_CARE_PROVIDER_SITE_OTHER): Payer: Medicaid Other | Admitting: Family Medicine

## 2014-09-12 ENCOUNTER — Ambulatory Visit (HOSPITAL_COMMUNITY)
Admission: RE | Admit: 2014-09-12 | Discharge: 2014-09-12 | Disposition: A | Discharge status: Home / Self Care | Payer: Medicaid Other | Source: Ambulatory Visit | Attending: Obstetrics and Gynecology | Admitting: Obstetrics and Gynecology

## 2014-09-12 VITALS — BP 111/68 | HR 98 | Temp 98.6°F | Wt 182.2 lb

## 2014-09-12 DIAGNOSIS — O24410 Gestational diabetes mellitus in pregnancy, diet controlled: Secondary | ICD-10-CM | POA: Diagnosis not present

## 2014-09-12 DIAGNOSIS — O99810 Abnormal glucose complicating pregnancy: Secondary | ICD-10-CM

## 2014-09-12 LAB — POCT URINALYSIS DIP (DEVICE)
Bilirubin Urine: NEGATIVE
GLUCOSE, UA: NEGATIVE mg/dL
Hgb urine dipstick: NEGATIVE
Ketones, ur: NEGATIVE mg/dL
LEUKOCYTES UA: NEGATIVE
Nitrite: NEGATIVE
PROTEIN: NEGATIVE mg/dL
Specific Gravity, Urine: 1.01 (ref 1.005–1.030)
UROBILINOGEN UA: 0.2 mg/dL (ref 0.0–1.0)
pH: 5.5 (ref 5.0–8.0)

## 2014-09-12 NOTE — Progress Notes (Signed)
U/s growth today, vtx, AFI WNL, 7 lb 12 oz, 3510 gms today FBS 78-92 2 hr pp 102-116 Schedule IOL at 40 wks next visit due to A1 DM

## 2014-09-12 NOTE — Patient Instructions (Signed)
Third Trimester of Pregnancy The third trimester is from week 29 through week 42, months 7 through 9. The third trimester is a time when the fetus is growing rapidly. At the end of the ninth month, the fetus is about 20 inches in length and weighs 6-10 pounds.  BODY CHANGES Your body goes through many changes during pregnancy. The changes vary from woman to woman.   Your weight will continue to increase. You can expect to gain 25-35 pounds (11-16 kg) by the end of the pregnancy.  You may begin to get stretch marks on your hips, abdomen, and breasts.  You may urinate more often because the fetus is moving lower into your pelvis and pressing on your bladder.  You may develop or continue to have heartburn as a result of your pregnancy.  You may develop constipation because certain hormones are causing the muscles that push waste through your intestines to slow down.  You may develop hemorrhoids or swollen, bulging veins (varicose veins).  You may have pelvic pain because of the weight gain and pregnancy hormones relaxing your joints between the bones in your pelvis. Backaches may result from overexertion of the muscles supporting your posture.  You may have changes in your hair. These can include thickening of your hair, rapid growth, and changes in texture. Some women also have hair loss during or after pregnancy, or hair that feels dry or thin. Your hair will most likely return to normal after your baby is born.  Your breasts will continue to grow and be tender. A yellow discharge may leak from your breasts called colostrum.  Your belly button may stick out.  You may feel short of breath because of your expanding uterus.  You may notice the fetus "dropping," or moving lower in your abdomen.  You may have a bloody mucus discharge. This usually occurs a few days to a week before labor begins.  Your cervix becomes thin and soft (effaced) near your due date. WHAT TO EXPECT AT YOUR  PRENATAL EXAMS  You will have prenatal exams every 2 weeks until week 36. Then, you will have weekly prenatal exams. During a routine prenatal visit:  You will be weighed to make sure you and the fetus are growing normally.  Your blood pressure is taken.  Your abdomen will be measured to track your baby's growth.  The fetal heartbeat will be listened to.  Any test results from the previous visit will be discussed.  You may have a cervical check near your due date to see if you have effaced. At around 36 weeks, your caregiver will check your cervix. At the same time, your caregiver will also perform a test on the secretions of the vaginal tissue. This test is to determine if a type of bacteria, Group B streptococcus, is present. Your caregiver will explain this further. Your caregiver may ask you:  What your birth plan is.  How you are feeling.  If you are feeling the baby move.  If you have had any abnormal symptoms, such as leaking fluid, bleeding, severe headaches, or abdominal cramping.  If you have any questions. Other tests or screenings that may be performed during your third trimester include:  Blood tests that check for low iron levels (anemia).  Fetal testing to check the health, activity level, and growth of the fetus. Testing is done if you have certain medical conditions or if there are problems during the pregnancy. FALSE LABOR You may feel small, irregular contractions that   eventually go away. These are called Braxton Hicks contractions, or false labor. Contractions may last for hours, days, or even weeks before true labor sets in. If contractions come at regular intervals, intensify, or become painful, it is best to be seen by your caregiver.  SIGNS OF LABOR   Menstrual-like cramps.  Contractions that are 5 minutes apart or less.  Contractions that start on the top of the uterus and spread down to the lower abdomen and back.  A sense of increased pelvic  pressure or back pain.  A watery or bloody mucus discharge that comes from the vagina. If you have any of these signs before the 37th week of pregnancy, call your caregiver right away. You need to go to the hospital to get checked immediately. HOME CARE INSTRUCTIONS   Avoid all smoking, herbs, alcohol, and unprescribed drugs. These chemicals affect the formation and growth of the baby.  Follow your caregiver's instructions regarding medicine use. There are medicines that are either safe or unsafe to take during pregnancy.  Exercise only as directed by your caregiver. Experiencing uterine cramps is a good sign to stop exercising.  Continue to eat regular, healthy meals.  Wear a good support bra for breast tenderness.  Do not use hot tubs, steam rooms, or saunas.  Wear your seat belt at all times when driving.  Avoid raw meat, uncooked cheese, cat litter boxes, and soil used by cats. These carry germs that can cause birth defects in the baby.  Take your prenatal vitamins.  Try taking a stool softener (if your caregiver approves) if you develop constipation. Eat more high-fiber foods, such as fresh vegetables or fruit and whole grains. Drink plenty of fluids to keep your urine clear or pale yellow.  Take warm sitz baths to soothe any pain or discomfort caused by hemorrhoids. Use hemorrhoid cream if your caregiver approves.  If you develop varicose veins, wear support hose. Elevate your feet for 15 minutes, 3-4 times a day. Limit salt in your diet.  Avoid heavy lifting, wear low heal shoes, and practice good posture.  Rest a lot with your legs elevated if you have leg cramps or low back pain.  Visit your dentist if you have not gone during your pregnancy. Use a soft toothbrush to brush your teeth and be gentle when you floss.  A sexual relationship may be continued unless your caregiver directs you otherwise.  Do not travel far distances unless it is absolutely necessary and only  with the approval of your caregiver.  Take prenatal classes to understand, practice, and ask questions about the labor and delivery.  Make a trial run to the hospital.  Pack your hospital bag.  Prepare the baby's nursery.  Continue to go to all your prenatal visits as directed by your caregiver. SEEK MEDICAL CARE IF:  You are unsure if you are in labor or if your water has broken.  You have dizziness.  You have mild pelvic cramps, pelvic pressure, or nagging pain in your abdominal area.  You have persistent nausea, vomiting, or diarrhea.  You have a bad smelling vaginal discharge.  You have pain with urination. SEEK IMMEDIATE MEDICAL CARE IF:   You have a fever.  You are leaking fluid from your vagina.  You have spotting or bleeding from your vagina.  You have severe abdominal cramping or pain.  You have rapid weight loss or gain.  You have shortness of breath with chest pain.  You notice sudden or extreme swelling   of your face, hands, ankles, feet, or legs.  You have not felt your baby move in over an hour.  You have severe headaches that do not go away with medicine.  You have vision changes. Document Released: 04/16/2001 Document Revised: 04/27/2013 Document Reviewed: 06/23/2012 [employer] Patient Information 2015 [employer]. This information is not intended to replace advice given to you by your health care provider. Make sure you discuss any questions you have with your health care provider.  Breastfeeding Deciding to breastfeed is one of the best choices you can make for you and your baby. A change in hormones during pregnancy causes your breast tissue to grow and increases the number and size of your milk ducts. These hormones also allow proteins, sugars, and fats from your blood supply to make breast milk in your milk-producing glands. Hormones prevent breast milk from being released before your baby is born as well as prompt milk flow after birth. Once  breastfeeding has begun, thoughts of your baby, as well as his or her sucking or crying, can stimulate the release of milk from your milk-producing glands.  BENEFITS OF BREASTFEEDING For Your Baby  Your first milk (colostrum) helps your baby's digestive system function better.   There are antibodies in your milk that help your baby fight off infections.   Your baby has a lower incidence of asthma, allergies, and sudden infant death syndrome.   The nutrients in breast milk are better for your baby than infant formulas and are designed uniquely for your baby's needs.   Breast milk improves your baby's brain development.   Your baby is less likely to develop other conditions, such as childhood obesity, asthma, or type 2 diabetes mellitus.  For You   Breastfeeding helps to create a very special bond between you and your baby.   Breastfeeding is convenient. Breast milk is always available at the correct temperature and costs nothing.   Breastfeeding helps to burn calories and helps you lose the weight gained during pregnancy.   Breastfeeding makes your uterus contract to its prepregnancy size faster and slows bleeding (lochia) after you give birth.   Breastfeeding helps to lower your risk of developing type 2 diabetes mellitus, osteoporosis, and breast or ovarian cancer later in life. SIGNS THAT YOUR BABY IS HUNGRY Early Signs of Hunger  Increased alertness or activity.  Stretching.  Movement of the head from side to side.  Movement of the head and opening of the mouth when the corner of the mouth or cheek is stroked (rooting).  Increased sucking sounds, smacking lips, cooing, sighing, or squeaking.  Hand-to-mouth movements.  Increased sucking of fingers or hands. Late Signs of Hunger  Fussing.  Intermittent crying. Extreme Signs of Hunger Signs of extreme hunger will require calming and consoling before your baby will be able to breastfeed successfully. Do not  wait for the following signs of extreme hunger to occur before you initiate breastfeeding:   Restlessness.  A loud, strong cry.   Screaming. BREASTFEEDING BASICS Breastfeeding Initiation  Find a comfortable place to sit or lie down, with your neck and back well supported.  Place a pillow or rolled up blanket under your baby to bring him or her to the level of your breast (if you are seated). Nursing pillows are specially designed to help support your arms and your baby while you breastfeed.  Make sure that your baby's abdomen is facing your abdomen.   Gently massage your breast. With your fingertips, massage from your chest   wall toward your nipple in a circular motion. This encourages milk flow. You may need to continue this action during the feeding if your milk flows slowly.  Support your breast with 4 fingers underneath and your thumb above your nipple. Make sure your fingers are well away from your nipple and your baby's mouth.   Stroke your baby's lips gently with your finger or nipple.   When your baby's mouth is open wide enough, quickly bring your baby to your breast, placing your entire nipple and as much of the colored area around your nipple (areola) as possible into your baby's mouth.   More areola should be visible above your baby's upper lip than below the lower lip.   Your baby's tongue should be between his or her lower gum and your breast.   Ensure that your baby's mouth is correctly positioned around your nipple (latched). Your baby's lips should create a seal on your breast and be turned out (everted).  It is common for your baby to suck about 2-3 minutes in order to start the flow of breast milk. Latching Teaching your baby how to latch on to your breast properly is very important. An improper latch can cause nipple pain and decreased milk supply for you and poor weight gain in your baby. Also, if your baby is not latched onto your nipple properly, he or she  may swallow some air during feeding. This can make your baby fussy. Burping your baby when you switch breasts during the feeding can help to get rid of the air. However, teaching your baby to latch on properly is still the best way to prevent fussiness from swallowing air while breastfeeding. Signs that your baby has successfully latched on to your nipple:    Silent tugging or silent sucking, without causing you pain.   Swallowing heard between every 3-4 sucks.    Muscle movement above and in front of his or her ears while sucking.  Signs that your baby has not successfully latched on to nipple:   Sucking sounds or smacking sounds from your baby while breastfeeding.  Nipple pain. If you think your baby has not latched on correctly, slip your finger into the corner of your baby's mouth to break the suction and place it between your baby's gums. Attempt breastfeeding initiation again. Signs of Successful Breastfeeding Signs from your baby:   A gradual decrease in the number of sucks or complete cessation of sucking.   Falling asleep.   Relaxation of his or her body.   Retention of a small amount of milk in his or her mouth.   Letting go of your breast by himself or herself. Signs from you:  Breasts that have increased in firmness, weight, and size 1-3 hours after feeding.   Breasts that are softer immediately after breastfeeding.  Increased milk volume, as well as a change in milk consistency and color by the fifth day of breastfeeding.   Nipples that are not sore, cracked, or bleeding. Signs That Your Baby is Getting Enough Milk  Wetting at least 3 diapers in a 24-hour period. The urine should be clear and pale yellow by age 5 days.  At least 3 stools in a 24-hour period by age 5 days. The stool should be soft and yellow.  At least 3 stools in a 24-hour period by age 7 days. The stool should be seedy and yellow.  No loss of weight greater than 10% of birth weight  during the first 3   days of age.  Average weight gain of 4-7 ounces (113-198 g) per week after age 4 days.  Consistent daily weight gain by age 5 days, without weight loss after the age of 2 weeks. After a feeding, your baby may spit up a small amount. This is common. BREASTFEEDING FREQUENCY AND DURATION Frequent feeding will help you make more milk and can prevent sore nipples and breast engorgement. Breastfeed when you feel the need to reduce the fullness of your breasts or when your baby shows signs of hunger. This is called "breastfeeding on demand." Avoid introducing a pacifier to your baby while you are working to establish breastfeeding (the first 4-6 weeks after your baby is born). After this time you may choose to use a pacifier. Research has shown that pacifier use during the first year of a baby's life decreases the risk of sudden infant death syndrome (SIDS). Allow your baby to feed on each breast as long as he or she wants. Breastfeed until your baby is finished feeding. When your baby unlatches or falls asleep while feeding from the first breast, offer the second breast. Because newborns are often sleepy in the first few weeks of life, you may need to awaken your baby to get him or her to feed. Breastfeeding times will vary from baby to baby. However, the following rules can serve as a guide to help you ensure that your baby is properly fed:  Newborns (babies 4 weeks of age or younger) may breastfeed every 1-3 hours.  Newborns should not go longer than 3 hours during the day or 5 hours during the night without breastfeeding.  You should breastfeed your baby a minimum of 8 times in a 24-hour period until you begin to introduce solid foods to your baby at around 6 months of age. BREAST MILK PUMPING Pumping and storing breast milk allows you to ensure that your baby is exclusively fed your breast milk, even at times when you are unable to breastfeed. This is especially important if you are  going back to work while you are still breastfeeding or when you are not able to be present during feedings. Your lactation consultant can give you guidelines on how long it is safe to store breast milk.  A breast pump is a machine that allows you to pump milk from your breast into a sterile bottle. The pumped breast milk can then be stored in a refrigerator or freezer. Some breast pumps are operated by hand, while others use electricity. Ask your lactation consultant which type will work best for you. Breast pumps can be purchased, but some hospitals and breastfeeding support groups lease breast pumps on a monthly basis. A lactation consultant can teach you how to hand express breast milk, if you prefer not to use a pump.  CARING FOR YOUR BREASTS WHILE YOU BREASTFEED Nipples can become dry, cracked, and sore while breastfeeding. The following recommendations can help keep your breasts moisturized and healthy:  Avoid using soap on your nipples.   Wear a supportive bra. Although not required, special nursing bras and tank tops are designed to allow access to your breasts for breastfeeding without taking off your entire bra or top. Avoid wearing underwire-style bras or extremely tight bras.  Air dry your nipples for 3-4minutes after each feeding.   Use only cotton bra pads to absorb leaked breast milk. Leaking of breast milk between feedings is normal.   Use lanolin on your nipples after breastfeeding. Lanolin helps to maintain your skin's   normal moisture barrier. If you use pure lanolin, you do not need to wash it off before feeding your baby again. Pure lanolin is not toxic to your baby. You may also hand express a few drops of breast milk and gently massage that milk into your nipples and allow the milk to air dry. In the first few weeks after giving birth, some women experience extremely full breasts (engorgement). Engorgement can make your breasts feel heavy, warm, and tender to the touch.  Engorgement peaks within 3-5 days after you give birth. The following recommendations can help ease engorgement:  Completely empty your breasts while breastfeeding or pumping. You may want to start by applying warm, moist heat (in the shower or with warm water-soaked hand towels) just before feeding or pumping. This increases circulation and helps the milk flow. If your baby does not completely empty your breasts while breastfeeding, pump any extra milk after he or she is finished.  Wear a snug bra (nursing or regular) or tank top for 1-2 days to signal your body to slightly decrease milk production.  Apply ice packs to your breasts, unless this is too uncomfortable for you.  Make sure that your baby is latched on and positioned properly while breastfeeding. If engorgement persists after 48 hours of following these recommendations, contact your health care provider or a lactation consultant. OVERALL HEALTH CARE RECOMMENDATIONS WHILE BREASTFEEDING  Eat healthy foods. Alternate between meals and snacks, eating 3 of each per day. Because what you eat affects your breast milk, some of the foods may make your baby more irritable than usual. Avoid eating these foods if you are sure that they are negatively affecting your baby.  Drink milk, fruit juice, and water to satisfy your thirst (about 10 glasses a day).   Rest often, relax, and continue to take your prenatal vitamins to prevent fatigue, stress, and anemia.  Continue breast self-awareness checks.  Avoid chewing and smoking tobacco.  Avoid alcohol and drug use. Some medicines that may be harmful to your baby can pass through breast milk. It is important to ask your health care provider before taking any medicine, including all over-the-counter and prescription medicine as well as vitamin and herbal supplements. It is possible to become pregnant while breastfeeding. If birth control is desired, ask your health care provider about options that  will be safe for your baby. SEEK MEDICAL CARE IF:   You feel like you want to stop breastfeeding or have become frustrated with breastfeeding.  You have painful breasts or nipples.  Your nipples are cracked or bleeding.  Your breasts are red, tender, or warm.  You have a swollen area on either breast.  You have a fever or chills.  You have nausea or vomiting.  You have drainage other than breast milk from your nipples.  Your breasts do not become full before feedings by the fifth day after you give birth.  You feel sad and depressed.  Your baby is too sleepy to eat well.  Your baby is having trouble sleeping.   Your baby is wetting less than 3 diapers in a 24-hour period.  Your baby has less than 3 stools in a 24-hour period.  Your baby's skin or the white part of his or her eyes becomes yellow.   Your baby is not gaining weight by 5 days of age. SEEK IMMEDIATE MEDICAL CARE IF:   Your baby is overly tired (lethargic) and does not want to wake up and feed.  Your baby   develops an unexplained fever. Document Released: 04/22/2005 Document Revised: 04/27/2013 Document Reviewed: 10/14/2012 [employer] Patient Information 2015 [employer]. This information is not intended to replace advice given to you by your health care provider. Make sure you discuss any questions you have with your health care provider.  

## 2014-09-19 ENCOUNTER — Ambulatory Visit (INDEPENDENT_AMBULATORY_CARE_PROVIDER_SITE_OTHER): Payer: Medicaid Other | Admitting: Obstetrics and Gynecology

## 2014-09-19 VITALS — BP 113/75 | HR 99 | Temp 98.2°F | Wt 182.3 lb

## 2014-09-19 DIAGNOSIS — O24410 Gestational diabetes mellitus in pregnancy, diet controlled: Secondary | ICD-10-CM | POA: Diagnosis not present

## 2014-09-19 DIAGNOSIS — O09893 Supervision of other high risk pregnancies, third trimester: Secondary | ICD-10-CM | POA: Diagnosis not present

## 2014-09-19 LAB — POCT URINALYSIS DIP (DEVICE)
Bilirubin Urine: NEGATIVE
GLUCOSE, UA: NEGATIVE mg/dL
Ketones, ur: NEGATIVE mg/dL
NITRITE: NEGATIVE
Protein, ur: NEGATIVE mg/dL
SPECIFIC GRAVITY, URINE: 1.025 (ref 1.005–1.030)
Urobilinogen, UA: 0.2 mg/dL (ref 0.0–1.0)
pH: 6 (ref 5.0–8.0)

## 2014-09-19 NOTE — Progress Notes (Signed)
No complaints today, doing well. + FM, occasional contractions, no VB, No LOF  Brings in Blood sugars: fbs 78 - 90 2 hr pp 100 - 115  Will schedule IOL today at 40 wks.

## 2014-09-19 NOTE — Progress Notes (Signed)
IOL 09/26/14 @ 730p.

## 2014-09-21 ENCOUNTER — Encounter (HOSPITAL_COMMUNITY): Payer: Self-pay | Admitting: *Deleted

## 2014-09-21 ENCOUNTER — Telehealth (HOSPITAL_COMMUNITY): Payer: Self-pay | Admitting: *Deleted

## 2014-09-21 NOTE — Telephone Encounter (Signed)
Preadmission screen Interpreter number [phone]

## 2014-09-26 ENCOUNTER — Encounter: Payer: Medicaid Other | Admitting: Obstetrics and Gynecology

## 2014-09-26 ENCOUNTER — Inpatient Hospital Stay (HOSPITAL_COMMUNITY)
Admission: RE | Admit: 2014-09-26 | Discharge: 2014-10-01 | DRG: 766 | Disposition: A | Discharge status: Home / Self Care | Payer: Medicaid Other | Source: Ambulatory Visit | Attending: Family Medicine | Admitting: Family Medicine

## 2014-09-26 DIAGNOSIS — O282 Abnormal cytological finding on antenatal screening of mother: Secondary | ICD-10-CM | POA: Diagnosis present

## 2014-09-26 DIAGNOSIS — O24420 Gestational diabetes mellitus in childbirth, diet controlled: Secondary | ICD-10-CM | POA: Diagnosis present

## 2014-09-26 DIAGNOSIS — Z3A40 40 weeks gestation of pregnancy: Secondary | ICD-10-CM | POA: Diagnosis present

## 2014-09-26 DIAGNOSIS — O24410 Gestational diabetes mellitus in pregnancy, diet controlled: Secondary | ICD-10-CM | POA: Diagnosis present

## 2014-09-26 DIAGNOSIS — D649 Anemia, unspecified: Secondary | ICD-10-CM | POA: Diagnosis not present

## 2014-09-26 DIAGNOSIS — Z91013 Allergy to seafood: Secondary | ICD-10-CM

## 2014-09-26 DIAGNOSIS — O480 Post-term pregnancy: Secondary | ICD-10-CM | POA: Diagnosis present

## 2014-09-26 DIAGNOSIS — E119 Type 2 diabetes mellitus without complications: Secondary | ICD-10-CM

## 2014-09-26 DIAGNOSIS — Z833 Family history of diabetes mellitus: Secondary | ICD-10-CM

## 2014-09-26 DIAGNOSIS — O9081 Anemia of the puerperium: Secondary | ICD-10-CM | POA: Diagnosis not present

## 2014-09-26 LAB — TYPE AND SCREEN
ABO/RH(D): B POS
Antibody Screen: NEGATIVE

## 2014-09-26 LAB — CBC
HCT: 37.9 % (ref 36.0–46.0)
Hemoglobin: 13.4 g/dL (ref 12.0–15.0)
MCH: 28.4 pg (ref 26.0–34.0)
MCHC: 35.4 g/dL (ref 30.0–36.0)
MCV: 80.3 fL (ref 78.0–100.0)
Platelets: 154 10*3/uL (ref 150–400)
RBC: 4.72 MIL/uL (ref 3.87–5.11)
RDW: 13.6 % (ref 11.5–15.5)
WBC: 14.2 10*3/uL — AB (ref 4.0–10.5)

## 2014-09-26 MED ORDER — LACTATED RINGERS IV SOLN
500.0000 mL | INTRAVENOUS | Status: DC | PRN
  Administered 2014-09-27: 1000 mL via INTRAVENOUS

## 2014-09-26 MED ORDER — TERBUTALINE SULFATE 1 MG/ML IJ SOLN: 0.2500 mg | Freq: Once | INTRAMUSCULAR | Status: AC | PRN

## 2014-09-26 MED ORDER — OXYTOCIN BOLUS FROM INFUSION: 500.0000 mL | INTRAVENOUS | Status: DC

## 2014-09-26 MED ORDER — ONDANSETRON HCL 4 MG/2ML IJ SOLN: 4.0000 mg | Freq: Four times a day (QID) | INTRAMUSCULAR | Status: DC | PRN

## 2014-09-26 MED ORDER — CITRIC ACID-SODIUM CITRATE 334-500 MG/5ML PO SOLN
30.0000 mL | ORAL | Status: DC | PRN
  Administered 2014-09-28: 30 mL via ORAL
  Filled 2014-09-26: qty 15

## 2014-09-26 MED ORDER — MISOPROSTOL 25 MCG QUARTER TABLET
25.0000 ug | ORAL_TABLET | ORAL | Status: DC | PRN
  Administered 2014-09-26 – 2014-09-27 (×2): 25 ug via VAGINAL
  Filled 2014-09-26 (×2): qty 0.25

## 2014-09-26 MED ORDER — ACETAMINOPHEN 325 MG PO TABS
650.0000 mg | ORAL_TABLET | ORAL | Status: DC | PRN
Start: 2014-09-26 — End: 2014-09-28
  Administered 2014-09-28: 650 mg via ORAL
  Filled 2014-09-26: qty 2

## 2014-09-26 MED ORDER — LACTATED RINGERS IV SOLN
INTRAVENOUS | Status: DC
  Administered 2014-09-27 – 2014-09-28 (×5): via INTRAVENOUS

## 2014-09-26 MED ORDER — OXYTOCIN 40 UNITS IN LACTATED RINGERS INFUSION - SIMPLE MED: 62.5000 mL/h | INTRAVENOUS | Status: DC

## 2014-09-26 MED ORDER — LIDOCAINE HCL (PF) 1 % IJ SOLN: 30.0000 mL | INTRAMUSCULAR | Status: DC | PRN

## 2014-09-26 MED ORDER — OXYCODONE-ACETAMINOPHEN 5-325 MG PO TABS: 1.0000 | ORAL_TABLET | ORAL | Status: DC | PRN

## 2014-09-26 MED ORDER — OXYCODONE-ACETAMINOPHEN 5-325 MG PO TABS: 2.0000 | ORAL_TABLET | ORAL | Status: DC | PRN

## 2014-09-26 NOTE — H&P (Cosign Needed)
LABOR ADMISSION HISTORY AND PHYSICAL  Debra Mills is a 29 y.o. female G1P0 with IUP at [redacted]w[redacted]d by LMP consistent with first trimester scan presenting for induction of labor secondary to A1GDM. She reports +FMs, No LOF, no VB, no blurry vision, headaches or peripheral edema, and RUQ pain.  She plans on breast feeding. She is unsure on birth control.  Dating: By LMP --->  Estimated Date of Delivery: 09/26/14  Sono:    '@[redacted]w[redacted]d'$ , CWD, normal anatomy, cephalic presentation, 3299M, 85% EFW   Prenatal History/Complications: - E2AST - LGSIL, needs postpartum colposcopy  Past Medical History: Past Medical History  Diagnosis Date  . Medical history non-contributory   . Gestational diabetes mellitus, antepartum   . Gestational diabetes     Past Surgical History: Past Surgical History  Procedure Laterality Date  . No past surgeries      Obstetrical History: OB History    Gravida Para Term Preterm AB TAB SAB Ectopic Multiple Living   1               Social History: History   Social History  . Marital Status: Married    Spouse Name: N/A  . Number of Children: N/A  . Years of Education: N/A   Social History Main Topics  . Smoking status: Never Smoker   . Smokeless tobacco: Never Used  . Alcohol Use: No  . Drug Use: No  . Sexual Activity: Yes    Birth Control/ Protection: None   Other Topics Concern  . Not on file   Social History Narrative    Family History: Family History  Problem Relation Age of Onset  . Diabetes Father   . Kidney disease Father     benign tumor removed 1 kidney  . Birth defects Brother     heart defect    Allergies: Allergies  Allergen Reactions  . Fish Allergy Hives    Prescriptions prior to admission  Medication Sig Dispense Refill Last Dose  . ACCU-CHEK FASTCLIX LANCETS MISC 1 Device by Percutaneous route 4 (four) times daily. 100 each 12 Taking  . Blood Glucose Monitoring Suppl (ACCU-CHEK NANO SMARTVIEW) W/DEVICE KIT 1 Device by Does  not apply route 4 (four) times daily. 1 kit 0 Taking  . glucose blood test strip Use as instructed 100 each 12 Taking  . Prenatal Vit-Fe Fumarate-FA (PRENATAL MULTIVITAMIN) TABS tablet Take 1 tablet by mouth daily at 12 noon.   Taking     Review of Systems   All systems reviewed and negative except as stated in HPI  Height $Remov'5\' 1"'QHwVmQ$  (1.549 m), weight 182 lb (82.555 kg), last menstrual period 12/20/2013. General appearance: alert, cooperative and no distress Lungs: clear to auscultation bilaterally Heart: regular rate and rhythm Abdomen: soft, non-tender; bowel sounds normal Pelvic: FT/th/hi posterior Extremities: Homans sign is negative, no sign of DVT DTR's wnl Presentation: cephalic Fetal monitoringBaseline: 140 bpm, Variability: Good {> 6 bpm), Accelerations: Reactive and Decelerations: Absent Uterine activity irregular every 3-5      Prenatal labs: ABO, Rh: B/POS/-- (04/25 0917) Antibody: NEG (04/25 0917) Rubella:   RPR: NON REAC (04/25 0917)  HBsAg: NEGATIVE (04/25 0917)  HIV: NONREACTIVE (04/25 0917)  GBS: Negative (04/25 0000)  3 hr Glucola 103/226/151/159 Genetic screening  Declined Anatomy US Normal  Prenatal Transfer Tool  Maternal Diabetes: Yes:  Diabetes Type:  Diet controlled Genetic Screening: Declined Maternal Ultrasounds/Referrals: Normal Fetal Ultrasounds or other Referrals:  None Maternal Substance Abuse:  No Significant Maternal Medications:  None Significant  Maternal Lab Results: Lab values include: Group B Strep negative  No results found for this or any previous visit (from the past 24 hour(s)).  Patient Active Problem List   Diagnosis Date Noted  . Diet-controlled diabetes mellitus 09/26/2014  . LGSIL (low grade squamous intraepithelial dysplasia) 08/11/2014  . Gestational diabetes 08/09/2014  . Abnormal glucose tolerance in pregnancy 08/04/2014    Assessment: Debra Mills is a 29 y.o. G1P0 at [redacted]w[redacted]d here for induction of labor secondary to  A1GDM  #Labor: Induction of labor with cytotec. Plan for foley bulb when able.  #A1GDM: CBG on admit, every 4 hours in latent labor, every 1 hour in active labor #Pain: Epidural on demand #FWB: Category I #ID:  GBS negative #MOF: Breast #MOC:Undecided #Circ:  Desires inpatient circumcision  Shelbie Hutching 09/26/2014, 8:50 PM

## 2014-09-27 ENCOUNTER — Inpatient Hospital Stay (HOSPITAL_COMMUNITY): Payer: Medicaid Other | Admitting: Anesthesiology

## 2014-09-27 LAB — GLUCOSE, CAPILLARY
Glucose-Capillary: 43 mg/dL — CL (ref 65–99)
Glucose-Capillary: 83 mg/dL (ref 65–99)
Glucose-Capillary: 97 mg/dL (ref 65–99)

## 2014-09-27 LAB — RPR: RPR Ser Ql: NONREACTIVE

## 2014-09-27 LAB — HIV ANTIBODY (ROUTINE TESTING W REFLEX): HIV Screen 4th Generation wRfx: NONREACTIVE

## 2014-09-27 MED ORDER — EPHEDRINE 5 MG/ML INJ
10.0000 mg | INTRAVENOUS | Status: DC | PRN
Start: 2014-09-27 — End: 2014-09-28

## 2014-09-27 MED ORDER — PHENYLEPHRINE 40 MCG/ML (10ML) SYRINGE FOR IV PUSH (FOR BLOOD PRESSURE SUPPORT)
80.0000 ug | PREFILLED_SYRINGE | INTRAVENOUS | Status: DC | PRN
  Filled 2014-09-27: qty 20

## 2014-09-27 MED ORDER — FENTANYL 2.5 MCG/ML BUPIVACAINE 1/10 % EPIDURAL INFUSION (WH - ANES): 14.0000 mL/h | INTRAMUSCULAR | Status: DC | PRN

## 2014-09-27 MED ORDER — LIDOCAINE HCL (PF) 1 % IJ SOLN
INTRAMUSCULAR | Status: DC | PRN
  Administered 2014-09-27 (×2): 4 mL

## 2014-09-27 MED ORDER — DIPHENHYDRAMINE HCL 50 MG/ML IJ SOLN: 12.5000 mg | INTRAMUSCULAR | Status: DC | PRN

## 2014-09-27 MED ORDER — TERBUTALINE SULFATE 1 MG/ML IJ SOLN: 0.2500 mg | Freq: Once | INTRAMUSCULAR | Status: AC | PRN

## 2014-09-27 MED ORDER — MISOPROSTOL 50MCG HALF TABLET
50.0000 ug | ORAL_TABLET | ORAL | Status: DC | PRN
  Administered 2014-09-27: 50 ug via ORAL
  Filled 2014-09-27: qty 0.5

## 2014-09-27 MED ORDER — OXYTOCIN 40 UNITS IN LACTATED RINGERS INFUSION - SIMPLE MED
1.0000 m[IU]/min | INTRAVENOUS | Status: DC
  Administered 2014-09-27: 2 m[IU]/min via INTRAVENOUS
  Filled 2014-09-27: qty 1000

## 2014-09-27 MED ORDER — FENTANYL 2.5 MCG/ML BUPIVACAINE 1/10 % EPIDURAL INFUSION (WH - ANES)
14.0000 mL/h | INTRAMUSCULAR | Status: DC | PRN
  Administered 2014-09-27 – 2014-09-28 (×4): 14 mL/h via EPIDURAL
  Filled 2014-09-27 (×4): qty 125

## 2014-09-27 NOTE — Progress Notes (Signed)
This note also relates to the following rows which could not be included: CBG Lab Component - View only - Cannot attach notes to extension rows   Apple juice given to pt for BS of 43 pt is asymptomatic

## 2014-09-27 NOTE — Progress Notes (Signed)
IUPC inserted with williams CNM blood noted in IUPC catheter and small amt of vaginal bleeding noted with small of clots noted

## 2014-09-27 NOTE — Progress Notes (Signed)
LABOR PROGRESS NOTE  Debra Mills is a 29 y.o. G1P0 at [redacted]w[redacted]d  admitted for induction of labor due to A1GDM.  Subjective: Starting to feel contractions  Objective: BP 117/70 mmHg  Pulse 77  Temp(Src) 98.4 F (36.9 C) (Oral)  Resp 18  Ht 5\' 1"  (1.549 m)  Wt 182 lb (82.555 kg)  BMI 34.41 kg/m2  LMP 12/20/2013 or  Filed Vitals:   09/26/14 1946 09/26/14 1953 09/27/14 0111  BP: 113/73  117/70  Pulse: 93  77  Temp: 98.6 F (37 C)  98.4 F (36.9 C)  TempSrc: Oral  Oral  Resp: 18  18  Height:  5\' 1"  (1.549 m)   Weight:  182 lb (82.555 kg)        FHT:  FHR: 135 bpm, variability: moderate,  accelerations:  Present,  decelerations:  Absent UC:   irregular, every 3-5 minutes SVE:   Dilation: 1 Effacement (%): Thick Station: -3 Exam by:: Debra Mills  Dilation: 1 Effacement (%): Thick Cervical Position: Posterior Station: -3 Presentation: Vertex Exam by:: Debra Mills   Labs: Lab Results  Component Value Date   WBC 14.2* 09/26/2014   HGB 13.4 09/26/2014   HCT 37.9 09/26/2014   MCV 80.3 09/26/2014   PLT 154 09/26/2014    Assessment / Plan: Induction of labor due to A1GDM  Labor: Placed cytotec x2. Plan for foley bulb when able.   A1GDM: Monitor CBGs in labor Fetal Wellbeing:  Category I Pain Control:  Epidural prn Anticipated MOD:  NSVD  Shelbie Hutching, MD 09/27/2014, 1:50 AM

## 2014-09-27 NOTE — Progress Notes (Signed)
Labor Progress Note  S: having back pain, currently has epidural.  RN having difficult time performing cervical exam.  O:  BP 125/83 mmHg  Pulse 104  Temp(Src) 98.9 F (37.2 C) (Oral)  Resp 18  Ht 5\' 1"  (1.549 m)  Wt 182 lb (82.555 kg)  BMI 34.41 kg/m2  LMP 12/20/2013 Cat I CVE: 1908 6/80/-2 (by Lelan Pons, IUPC placed) 2100 6/70/-2  A&P: 29 y.o. G1P0 [redacted]w[redacted]d here with premature rupture of membranes #labor: inadequate MVUs currently, will continue to increase pitocin.  At this time prognosis of vaginal delivery guarded secondary inadequate pelvis.  Upon cursory leopold's suspect >8lb baby.  No indication for c/s at this time given inadequate MVUs, cat I strip.    Merla Riches, MD 10:09 PM

## 2014-09-27 NOTE — Progress Notes (Signed)
Patient ID: Debra Mills, female   DOB: June 27, 1985, 29 y.o.   MRN: [id number] Doing well.  Comfortable with epidural  Filed Vitals:   09/27/14 0830 09/27/14 0900 09/27/14 0930 09/27/14 1000  BP: 119/69 112/78 111/92 113/70  Pulse: 75 77 91 80  Temp:      TempSrc:      Resp:  18 18 18   Height:      Weight:       FHR reassuring UCs frequent due to Cytotec  Dilation: 2 Effacement (%): Thick Cervical Position: Posterior Station: -2 Presentation: Vertex Exam by:: brewer rnc  Foley bulb placed

## 2014-09-27 NOTE — Anesthesia Preprocedure Evaluation (Addendum)
Anesthesia Evaluation  Patient identified by MRN, date of birth, ID band Patient awake    Reviewed: Allergy & Precautions, H&P , NPO status , Patient's Chart, lab work & pertinent test results  Airway Mallampati: II  TM Distance: >3 FB Neck ROM: full    Dental   Pulmonary neg pulmonary ROS,  breath sounds clear to auscultation        Cardiovascular negative cardio ROS  Rhythm:regular Rate:Normal     Neuro/Psych negative neurological ROS  negative psych ROS   GI/Hepatic negative GI ROS, Neg liver ROS,   Endo/Other  diabetes  Renal/GU negative Renal ROS     Musculoskeletal   Abdominal   Peds  Hematology   Anesthesia Other Findings   Reproductive/Obstetrics                            Anesthesia Physical Anesthesia Plan  ASA: II and emergent  Anesthesia Plan: Epidural   Post-op Pain Management:    Induction:   Airway Management Planned: Natural Airway  Additional Equipment:   Intra-op Plan:   Post-operative Plan:   Informed Consent: I have reviewed the patients History and Physical, chart, labs and discussed the procedure including the risks, benefits and alternatives for the proposed anesthesia with the patient or authorized representative who has indicated his/her understanding and acceptance.   Dental Advisory Given  Plan Discussed with: Anesthesiologist, CRNA and Surgeon  Anesthesia Plan Comments: (Patient for C/section for arrest of descent. Will use epidural for C/Section. M. Royce Macadamia, MD)       Anesthesia Quick Evaluation

## 2014-09-27 NOTE — Anesthesia Procedure Notes (Signed)
Epidural Patient location during procedure: OB Start time: 09/27/2014 7:40 AM End time: 09/27/2014 7:50 AM  Staffing Anesthesiologist: Duane Boston Performed by: anesthesiologist   Preanesthetic Checklist Completed: patient identified, pre-op evaluation, timeout performed, IV checked, risks and benefits discussed and monitors and equipment checked  Epidural Patient position: sitting Prep: DuraPrep Patient monitoring: blood pressure Approach: midline Location: L3-L4 Injection technique: LOR saline  Needle:  Needle type: Tuohy  Needle gauge: 17 G Needle length: 9 cm Needle insertion depth: 7 cm Catheter size: 19 Gauge Test dose: negative  Assessment Events: blood not aspirated, injection not painful, no injection resistance, negative IV test and no paresthesia  Additional Notes Informed consent obtained prior to proceeding including risk of failure, 1% risk of PDPH, risk of minor discomfort and bruising.  Discussed rare but serious complications including epidural abscess, permanent nerve injury, epidural hematoma.  Discussed alternatives to epidural analgesia and patient desires to proceed.  Timeout performed pre-procedure verifying patient name, procedure, and platelet count.  Patient tolerated procedure well.  Catheter secured at 12 at the skin.  SA test negative as confirmed by no motor block of hip abduction at 5 min post injection of 50 mg of lidocaine into the epidural catheter.  Bupivacaine 0.1% with fentanyl 2.26mcg/ml infused post procedure at 64ml/hr with PCEA of 55ml every 10 mins.

## 2014-09-27 NOTE — Progress Notes (Signed)
Patient ID: Debra Mills, female   DOB: 12-Dec-1985, 29 y.o.   MRN: [id number] Doing well.  Comfortable with contractions  FHR reactive  Dilation: 6 Effacement (%): 80 Cervical Position: Middle Station: -2 Presentation: Vertex Exam by:: phelps   UCs now slowed somewhat, so will insert IUPC and start Pitocin

## 2014-09-27 NOTE — Progress Notes (Signed)
Labor Progress Note  Debra Mills is a 29 y.o. G1P0 at [redacted]w[redacted]d  admitted for induction of labor due to Gestational diabetes.  S: Doing well. Epidural controlling pain.   O:  BP 119/78 mmHg  Pulse 81  Temp(Src) 98.1 F (36.7 C) (Oral)  Resp 18  Ht 5\' 1"  (1.549 m)  Wt 182 lb (82.555 kg)  BMI 34.41 kg/m2  LMP 12/20/2013   FHT:  FHR: 130 bpm, variability: moderate,  accelerations:  Present,  decelerations:  Absent UC:   regular, every 2-4 minutes SVE:   Dilation: 5.5 Effacement (%): 80 Station: -2 Exam by:: Phelphs, Brewer RNC  SROM: clear @800a   Labs: Lab Results  Component Value Date   WBC 14.2* 09/26/2014   HGB 13.4 09/26/2014   HCT 37.9 09/26/2014   MCV 80.3 09/26/2014   PLT 154 09/26/2014    Assessment / Plan: 29 y.o. G1P0 [redacted]w[redacted]d in active labor Induction of labor due to gestational diabetes,  progressing well.  Labor: Progressing normally Fetal Wellbeing:  Category I Pain Control:  Epidural Anticipated MOD:  NSVD  Expectant management  Luiz Blare, DO 09/27/2014, 3:38 PM PGY-1, [facility]

## 2014-09-28 ENCOUNTER — Encounter (HOSPITAL_COMMUNITY): Payer: Self-pay

## 2014-09-28 ENCOUNTER — Inpatient Hospital Stay (HOSPITAL_COMMUNITY): Payer: Medicaid Other | Admitting: Certified Registered Nurse Anesthetist

## 2014-09-28 ENCOUNTER — Encounter (HOSPITAL_COMMUNITY)
Admission: RE | Disposition: A | Discharge status: Home / Self Care | Payer: Self-pay | Source: Ambulatory Visit | Attending: Family Medicine

## 2014-09-28 DIAGNOSIS — Z3A40 40 weeks gestation of pregnancy: Secondary | ICD-10-CM

## 2014-09-28 DIAGNOSIS — O24420 Gestational diabetes mellitus in childbirth, diet controlled: Secondary | ICD-10-CM

## 2014-09-28 LAB — GLUCOSE, CAPILLARY
GLUCOSE-CAPILLARY: 116 mg/dL — AB (ref 65–99)
GLUCOSE-CAPILLARY: 116 mg/dL — AB (ref 65–99)
Glucose-Capillary: 102 mg/dL — ABNORMAL HIGH (ref 65–99)
Glucose-Capillary: 89 mg/dL (ref 65–99)
Glucose-Capillary: 96 mg/dL (ref 65–99)

## 2014-09-28 SURGERY — Surgical Case
Anesthesia: Epidural

## 2014-09-28 MED ORDER — WITCH HAZEL-GLYCERIN EX PADS: 1.0000 "application " | MEDICATED_PAD | CUTANEOUS | Status: DC | PRN

## 2014-09-28 MED ORDER — TETANUS-DIPHTH-ACELL PERTUSSIS 5-2.5-18.5 LF-MCG/0.5 IM SUSP: 0.5000 mL | Freq: Once | INTRAMUSCULAR | Status: DC

## 2014-09-28 MED ORDER — LACTATED RINGERS IV SOLN
INTRAVENOUS | Status: DC | PRN
  Administered 2014-09-28: 15:00:00 via INTRAVENOUS

## 2014-09-28 MED ORDER — KETOROLAC TROMETHAMINE 30 MG/ML IJ SOLN: 30.0000 mg | Freq: Four times a day (QID) | INTRAMUSCULAR | Status: DC | PRN

## 2014-09-28 MED ORDER — KETOROLAC TROMETHAMINE 30 MG/ML IJ SOLN
INTRAMUSCULAR | Status: AC
  Filled 2014-09-28: qty 1

## 2014-09-28 MED ORDER — NALBUPHINE HCL 10 MG/ML IJ SOLN: 5.0000 mg | Freq: Once | INTRAMUSCULAR | Status: DC | PRN

## 2014-09-28 MED ORDER — SODIUM BICARBONATE 8.4 % IV SOLN
INTRAVENOUS | Status: DC | PRN
  Administered 2014-09-28: 3 mL via EPIDURAL
  Administered 2014-09-28: 2 mL via EPIDURAL
  Administered 2014-09-28: 3 mL via EPIDURAL

## 2014-09-28 MED ORDER — ONDANSETRON HCL 4 MG/2ML IJ SOLN
INTRAMUSCULAR | Status: DC | PRN
  Administered 2014-09-28: 4 mg via INTRAVENOUS

## 2014-09-28 MED ORDER — IBUPROFEN 600 MG PO TABS
600.0000 mg | ORAL_TABLET | Freq: Four times a day (QID) | ORAL | Status: DC
  Administered 2014-09-29 – 2014-10-01 (×11): 600 mg via ORAL
  Filled 2014-09-28 (×11): qty 1

## 2014-09-28 MED ORDER — FENTANYL CITRATE (PF) 100 MCG/2ML IJ SOLN
INTRAMUSCULAR | Status: AC
  Filled 2014-09-28: qty 2

## 2014-09-28 MED ORDER — SIMETHICONE 80 MG PO CHEW
80.0000 mg | CHEWABLE_TABLET | ORAL | Status: DC | PRN
  Filled 2014-09-28: qty 1

## 2014-09-28 MED ORDER — NALBUPHINE HCL 10 MG/ML IJ SOLN: 5.0000 mg | INTRAMUSCULAR | Status: DC | PRN

## 2014-09-28 MED ORDER — SIMETHICONE 80 MG PO CHEW
80.0000 mg | CHEWABLE_TABLET | ORAL | Status: DC
  Administered 2014-09-29 – 2014-09-30 (×3): 80 mg via ORAL
  Filled 2014-09-28 (×3): qty 1

## 2014-09-28 MED ORDER — CEFAZOLIN SODIUM-DEXTROSE 2-3 GM-% IV SOLR
INTRAVENOUS | Status: DC | PRN
  Administered 2014-09-28: 2 g via INTRAVENOUS

## 2014-09-28 MED ORDER — CEFAZOLIN SODIUM-DEXTROSE 2-3 GM-% IV SOLR
2.0000 g | Freq: Once | INTRAVENOUS | Status: AC
  Administered 2014-09-28: 2 g via INTRAVENOUS
  Filled 2014-09-28: qty 50

## 2014-09-28 MED ORDER — MISOPROSTOL 200 MCG PO TABS
ORAL_TABLET | ORAL | Status: AC
  Filled 2014-09-28: qty 5

## 2014-09-28 MED ORDER — ACETAMINOPHEN 325 MG PO TABS
650.0000 mg | ORAL_TABLET | ORAL | Status: DC | PRN
  Administered 2014-09-29 – 2014-09-30 (×2): 650 mg via ORAL
  Filled 2014-09-28 (×2): qty 2

## 2014-09-28 MED ORDER — MEPERIDINE HCL 25 MG/ML IJ SOLN: 6.2500 mg | INTRAMUSCULAR | Status: DC | PRN

## 2014-09-28 MED ORDER — CEFAZOLIN SODIUM-DEXTROSE 2-3 GM-% IV SOLR
INTRAVENOUS | Status: AC
  Filled 2014-09-28: qty 50

## 2014-09-28 MED ORDER — SODIUM CHLORIDE 0.9 % IV SOLN
2.0000 g | Freq: Four times a day (QID) | INTRAVENOUS | Status: AC
  Administered 2014-09-28 – 2014-09-29 (×4): 2 g via INTRAVENOUS
  Filled 2014-09-28 (×4): qty 2000

## 2014-09-28 MED ORDER — ONDANSETRON HCL 4 MG/2ML IJ SOLN
INTRAMUSCULAR | Status: AC
  Filled 2014-09-28: qty 2

## 2014-09-28 MED ORDER — OXYTOCIN 40 UNITS IN LACTATED RINGERS INFUSION - SIMPLE MED: 1.0000 m[IU]/min | INTRAVENOUS | Status: DC

## 2014-09-28 MED ORDER — FENTANYL CITRATE (PF) 100 MCG/2ML IJ SOLN: 25.0000 ug | INTRAMUSCULAR | Status: DC | PRN

## 2014-09-28 MED ORDER — OXYCODONE-ACETAMINOPHEN 5-325 MG PO TABS
2.0000 | ORAL_TABLET | ORAL | Status: DC | PRN
  Administered 2014-09-29: 2 via ORAL
  Filled 2014-09-28: qty 2

## 2014-09-28 MED ORDER — NALOXONE HCL 0.4 MG/ML IJ SOLN: 0.4000 mg | INTRAMUSCULAR | Status: DC | PRN

## 2014-09-28 MED ORDER — LACTATED RINGERS IV SOLN
INTRAVENOUS | Status: DC
Start: 2014-09-28 — End: 2014-09-30
  Administered 2014-09-28: 22:00:00 via INTRAVENOUS

## 2014-09-28 MED ORDER — GENTAMICIN SULFATE 40 MG/ML IJ SOLN
140.0000 mg | Freq: Two times a day (BID) | INTRAVENOUS | Status: DC
  Administered 2014-09-28: 140 mg via INTRAVENOUS
  Filled 2014-09-28 (×2): qty 3.5

## 2014-09-28 MED ORDER — SCOPOLAMINE 1 MG/3DAYS TD PT72
1.0000 | MEDICATED_PATCH | Freq: Once | TRANSDERMAL | Status: DC
  Administered 2014-09-28: 1.5 mg via TRANSDERMAL

## 2014-09-28 MED ORDER — MORPHINE SULFATE (PF) 0.5 MG/ML IJ SOLN
INTRAMUSCULAR | Status: DC | PRN
  Administered 2014-09-28: 1 mg via INTRAVENOUS
  Administered 2014-09-28: 4 mg via EPIDURAL

## 2014-09-28 MED ORDER — SODIUM CHLORIDE 0.9 % IJ SOLN: 3.0000 mL | INTRAMUSCULAR | Status: DC | PRN

## 2014-09-28 MED ORDER — OXYTOCIN 10 UNIT/ML IJ SOLN
INTRAMUSCULAR | Status: AC
  Filled 2014-09-28: qty 4

## 2014-09-28 MED ORDER — SCOPOLAMINE 1 MG/3DAYS TD PT72
MEDICATED_PATCH | TRANSDERMAL | Status: AC
  Filled 2014-09-28: qty 1

## 2014-09-28 MED ORDER — GENTAMICIN SULFATE 40 MG/ML IJ SOLN
140.0000 mg | Freq: Two times a day (BID) | INTRAVENOUS | Status: AC
  Administered 2014-09-29 (×2): 140 mg via INTRAVENOUS
  Filled 2014-09-28 (×2): qty 3.5

## 2014-09-28 MED ORDER — NALOXONE HCL 1 MG/ML IJ SOLN: 1.0000 ug/kg/h | INTRAVENOUS | Status: DC | PRN

## 2014-09-28 MED ORDER — 0.9 % SODIUM CHLORIDE (POUR BTL) OPTIME
TOPICAL | Status: DC | PRN
  Administered 2014-09-28: 500 mL

## 2014-09-28 MED ORDER — ZOLPIDEM TARTRATE 5 MG PO TABS: 5.0000 mg | ORAL_TABLET | Freq: Every evening | ORAL | Status: DC | PRN

## 2014-09-28 MED ORDER — LANOLIN HYDROUS EX OINT: 1.0000 "application " | TOPICAL_OINTMENT | CUTANEOUS | Status: DC | PRN

## 2014-09-28 MED ORDER — LACTATED RINGERS IV SOLN
40.0000 [IU] | INTRAVENOUS | Status: DC | PRN
  Administered 2014-09-28: 40 [IU] via INTRAVENOUS

## 2014-09-28 MED ORDER — DIPHENHYDRAMINE HCL 25 MG PO CAPS: 25.0000 mg | ORAL_CAPSULE | Freq: Four times a day (QID) | ORAL | Status: DC | PRN

## 2014-09-28 MED ORDER — OXYTOCIN 40 UNITS IN LACTATED RINGERS INFUSION - SIMPLE MED: 62.5000 mL/h | INTRAVENOUS | Status: AC

## 2014-09-28 MED ORDER — MIDAZOLAM HCL 2 MG/2ML IJ SOLN
INTRAMUSCULAR | Status: AC
  Filled 2014-09-28: qty 2

## 2014-09-28 MED ORDER — IBUPROFEN 600 MG PO TABS: 600.0000 mg | ORAL_TABLET | Freq: Four times a day (QID) | ORAL | Status: DC | PRN

## 2014-09-28 MED ORDER — SODIUM BICARBONATE 8.4 % IV SOLN
INTRAVENOUS | Status: DC | PRN
  Administered 2014-09-28 (×2): 5 mL via EPIDURAL

## 2014-09-28 MED ORDER — SENNOSIDES-DOCUSATE SODIUM 8.6-50 MG PO TABS
2.0000 | ORAL_TABLET | ORAL | Status: DC
  Administered 2014-09-29 – 2014-09-30 (×3): 2 via ORAL
  Filled 2014-09-28 (×3): qty 2

## 2014-09-28 MED ORDER — METOCLOPRAMIDE HCL 5 MG/ML IJ SOLN: 10.0000 mg | Freq: Once | INTRAMUSCULAR | Status: DC | PRN

## 2014-09-28 MED ORDER — DIPHENHYDRAMINE HCL 25 MG PO CAPS: 25.0000 mg | ORAL_CAPSULE | ORAL | Status: DC | PRN

## 2014-09-28 MED ORDER — DIPHENHYDRAMINE HCL 50 MG/ML IJ SOLN: 12.5000 mg | INTRAMUSCULAR | Status: DC | PRN

## 2014-09-28 MED ORDER — DIBUCAINE 1 % RE OINT: 1.0000 "application " | TOPICAL_OINTMENT | RECTAL | Status: DC | PRN

## 2014-09-28 MED ORDER — SODIUM BICARBONATE 8.4 % IV SOLN
INTRAVENOUS | Status: AC
  Filled 2014-09-28: qty 50

## 2014-09-28 MED ORDER — ONDANSETRON HCL 4 MG/2ML IJ SOLN: 4.0000 mg | Freq: Three times a day (TID) | INTRAMUSCULAR | Status: DC | PRN

## 2014-09-28 MED ORDER — MIDAZOLAM HCL 2 MG/2ML IJ SOLN
INTRAMUSCULAR | Status: DC | PRN
  Administered 2014-09-28: 1 mg via INTRAVENOUS

## 2014-09-28 MED ORDER — MORPHINE SULFATE 0.5 MG/ML IJ SOLN
INTRAMUSCULAR | Status: AC
  Filled 2014-09-28: qty 10

## 2014-09-28 MED ORDER — ACETAMINOPHEN 10 MG/ML IV SOLN
1000.0000 mg | Freq: Once | INTRAVENOUS | Status: AC
  Administered 2014-09-28: 1000 mg via INTRAVENOUS
  Filled 2014-09-28: qty 100

## 2014-09-28 MED ORDER — KETOROLAC TROMETHAMINE 30 MG/ML IJ SOLN
30.0000 mg | Freq: Four times a day (QID) | INTRAMUSCULAR | Status: DC | PRN
  Administered 2014-09-28: 30 mg via INTRAMUSCULAR

## 2014-09-28 MED ORDER — LIDOCAINE-EPINEPHRINE (PF) 2 %-1:200000 IJ SOLN
INTRAMUSCULAR | Status: AC
  Filled 2014-09-28: qty 20

## 2014-09-28 MED ORDER — CLINDAMYCIN PHOSPHATE 900 MG/50ML IV SOLN
900.0000 mg | Freq: Three times a day (TID) | INTRAVENOUS | Status: AC
  Administered 2014-09-28 – 2014-09-29 (×3): 900 mg via INTRAVENOUS
  Filled 2014-09-28 (×3): qty 50

## 2014-09-28 MED ORDER — SODIUM CHLORIDE 0.9 % IV SOLN
2.0000 g | Freq: Four times a day (QID) | INTRAVENOUS | Status: DC
  Administered 2014-09-28: 2 g via INTRAVENOUS
  Filled 2014-09-28 (×3): qty 2000

## 2014-09-28 MED ORDER — MENTHOL 3 MG MT LOZG: 1.0000 | LOZENGE | OROMUCOSAL | Status: DC | PRN

## 2014-09-28 MED ORDER — MEASLES, MUMPS & RUBELLA VAC ~~LOC~~ INJ: 0.5000 mL | INJECTION | Freq: Once | SUBCUTANEOUS | Status: DC

## 2014-09-28 MED ORDER — MEPERIDINE HCL 25 MG/ML IJ SOLN
6.2500 mg | INTRAMUSCULAR | Status: DC | PRN
Start: 2014-09-28 — End: 2014-09-28

## 2014-09-28 MED ORDER — FENTANYL CITRATE (PF) 100 MCG/2ML IJ SOLN
INTRAMUSCULAR | Status: DC | PRN
  Administered 2014-09-28: 50 ug via INTRAVENOUS

## 2014-09-28 MED ORDER — OXYCODONE-ACETAMINOPHEN 5-325 MG PO TABS
1.0000 | ORAL_TABLET | ORAL | Status: DC | PRN
  Administered 2014-09-29 – 2014-09-30 (×2): 1 via ORAL
  Filled 2014-09-28 (×2): qty 1

## 2014-09-28 SURGICAL SUPPLY — 31 items
BINDER ABD UNIV 10 28-50 (GAUZE/BANDAGES/DRESSINGS) ×1 IMPLANT
BINDER ABDOM UNIV 10 (GAUZE/BANDAGES/DRESSINGS) ×3
CLAMP CORD UMBIL (MISCELLANEOUS) IMPLANT
CLOTH BEACON ORANGE TIMEOUT ST (SAFETY) ×3 IMPLANT
DRAIN JACKSON PRT FLT 7MM (DRAIN) IMPLANT
DRAPE SHEET LG 3/4 BI-LAMINATE (DRAPES) IMPLANT
DRSG OPSITE POSTOP 4X10 (GAUZE/BANDAGES/DRESSINGS) ×3 IMPLANT
DURAPREP 26ML APPLICATOR (WOUND CARE) ×3 IMPLANT
ELECT REM PT RETURN 9FT ADLT (ELECTROSURGICAL) ×3
ELECTRODE REM PT RTRN 9FT ADLT (ELECTROSURGICAL) ×1 IMPLANT
EVACUATOR SILICONE 100CC (DRAIN) IMPLANT
EXTRACTOR VACUUM M CUP 4 TUBE (SUCTIONS) IMPLANT
EXTRACTOR VACUUM M CUP 4' TUBE (SUCTIONS)
GAUZE SPONGE 4X4 12PLY STRL (GAUZE/BANDAGES/DRESSINGS) ×3 IMPLANT
GLOVE BIO SURGEON STRL SZ7 (GLOVE) ×3 IMPLANT
GLOVE BIOGEL PI IND STRL 7.0 (GLOVE) ×1 IMPLANT
GLOVE BIOGEL PI INDICATOR 7.0 (GLOVE) ×2
GOWN STRL REUS W/TWL LRG LVL3 (GOWN DISPOSABLE) ×6 IMPLANT
KIT ABG SYR 3ML LUER SLIP (SYRINGE) IMPLANT
NEEDLE HYPO 25X5/8 SAFETYGLIDE (NEEDLE) ×3 IMPLANT
NS IRRIG 1000ML POUR BTL (IV SOLUTION) ×3 IMPLANT
PACK C SECTION WH (CUSTOM PROCEDURE TRAY) ×3 IMPLANT
PAD OB MATERNITY 4.3X12.25 (PERSONAL CARE ITEMS) ×3 IMPLANT
RTRCTR C-SECT PINK 25CM LRG (MISCELLANEOUS) ×3 IMPLANT
SUT VIC AB 0 CTX 36 (SUTURE) ×10
SUT VIC AB 0 CTX36XBRD ANBCTRL (SUTURE) ×5 IMPLANT
SUT VIC AB 2-0 SH 27 (SUTURE) ×2
SUT VIC AB 2-0 SH 27XBRD (SUTURE) ×1 IMPLANT
SUT VIC AB 4-0 KS 27 (SUTURE) ×6 IMPLANT
TOWEL OR 17X24 6PK STRL BLUE (TOWEL DISPOSABLE) ×3 IMPLANT
TRAY FOLEY CATH SILVER 14FR (SET/KITS/TRAYS/PACK) ×3 IMPLANT

## 2014-09-28 NOTE — Progress Notes (Addendum)
ANTIBIOTIC CONSULT NOTE - INITIAL  Pharmacy Consult for Gentamicin Indication: Chorioamnionitis   Allergies  Allergen Reactions  . Fish Allergy Hives    Patient Measurements: Height: 5\' 1"  (154.9 cm) Weight: 182 lb (82.555 kg) IBW/kg (Calculated) : 47.8 Adjusted Body Weight: 58.3 kg  Vital Signs: Temp: 101.3 F (38.5 C) (05/25 1100) Temp Source: Axillary (05/25 1100) BP: 139/82 mmHg (05/25 1100) Pulse Rate: 96 (05/25 1100)  Labs:  Recent Labs  09/26/14 1955  WBC 14.2*  HGB 13.4  PLT 154   No results for input(s): GENTTROUGH, GENTPEAK, GENTRANDOM in the last 72 hours.   Microbiology: Recent Results (from the past 720 hour(s))  Culture, beta strep (group b only)     Status: None   Collection Time: 08/29/14  1:55 PM  Result Value Ref Range Status   Organism ID, Bacteria NO GROUP B STREP (S.AGALACTIAE) ISOLATED  Final  GC/Chlamydia Probe Amp     Status: None   Collection Time: 08/29/14  1:55 PM  Result Value Ref Range Status   CT Probe RNA NEGATIVE  Final   GC Probe RNA NEGATIVE  Final    Comment:                                                                                         **Normal Reference Range: Negative**         Assay performed using the Gen-Probe APTIMA COMBO2 (R) Assay.   Acceptable specimen types for this assay include APTIMA Swabs (Unisex, endocervical, urethral, or vaginal), first void urine, and ThinPrep liquid based cytology samples.     Medications:  Ampicillin 2gm IV q6hrs  Assessment: 29 y.o. female G1P0 at [redacted]w[redacted]d on ampicillin and gentamicin for suspected chorio. Estimated Ke = 0.244, Vd = 0.4L/kg  Goal of Therapy:  Gentamicin peak 6-8 mg/L and Trough < 1 mg/L  Plan:  Gentamicin 140 mg IV every 12 hrs  Check Scr with next labs if gentamicin continued. Will check gentamicin levels if continued > 72hr or clinically indicated.  Jerrye Beavers, PharmD, BCPS Clinical Pharmacist   Gentamicin continued x 24 hours post-op. Will  continue Gentamicin 140mg  IV q12h x 2 doses. No follow up will be necessary.

## 2014-09-28 NOTE — Progress Notes (Signed)
Dr. Gala Romney at bedside, discussing plan of care, recommending c-section due to arrest of descent, explaining risks and benefits of c-section, pt verbalizes understanding and consents to c-section, FOB at bedside

## 2014-09-28 NOTE — Progress Notes (Addendum)
Labor Progress Note  S: no complaints  O:  BP 120/71 mmHg  Pulse 112  Temp(Src) 98.6 F (37 C) (Oral)  Resp 18  Ht 5\' 1"  (1.549 m)  Wt 182 lb (82.555 kg)  BMI 34.41 kg/m2  LMP 12/20/2013 Cat I CVE:  0530 SROM 1330 FB out 1908 6/80/-2 (by Lelan Pons, IUPC placed)  2100 6/70/-2 2300 contractions adequate 0130 6-7/80/-2 0530 8-9/80/-2  A&P: 29 y.o. G1P0 [redacted]w[redacted]d here with premature rupture of membranes #labor: making good progress however I still feel vaginal delivery unlikely as station has not changed    Debra Kuhnle [name], MD 6:11 AM  Pt now having occasional lates, 3-4 noted over past 2 hours.  Will decrease pitocin by half for now.  Will discontinue if persist.  Continue other intrauterine resuscitative efforts.

## 2014-09-28 NOTE — Progress Notes (Signed)
Patient ID: Debra Mills, female   DOB: Apr 05, 1986, 29 y.o.   MRN: [id number]  Filed Vitals:   09/28/14 0800 09/28/14 0830 09/28/14 0900 09/28/14 0930  BP: 135/89 114/70 117/70 124/78  Pulse: 122 114 105 113  Temp:    98 F (36.7 C)  TempSrc:    Oral  Resp: 18 18 18 18   Height:      Weight:       Doing well but feeling some pressure  FHR reassuring UCs every 2-3 min  Dilation: 10 Dilation Complete Date: 09/28/14 Dilation Complete Time: 0814 Effacement (%): 100 Cervical Position: Middle Station: +1 Presentation: Vertex Exam by:: Hansel Feinstein, CNM  Will top up epidural a little bit and start pushing

## 2014-09-28 NOTE — Progress Notes (Addendum)
Labor Progress Note  S: no complaints  O:  BP 111/74 mmHg  Pulse 94  Temp(Src) 99.2 F (37.3 C) (Oral)  Resp 18  Ht 5\' 1"  (1.549 m)  Wt 182 lb (82.555 kg)  BMI 34.41 kg/m2  LMP 12/20/2013 Cat I CVE:  0530 SROM 1330 FB out 1908 6/80/-2 (by Lelan Pons, IUPC placed)  2100 6/70/-2 2300 contractions adequate 0130 6-7/80/-2  A&P: 29 y.o. G1P0 [redacted]w[redacted]d here with premature rupture of membranes #labor: adequate since about 2300, minimal cervical change but some change noted.  Still guarded prognosis however with minimal cervical change and cat I strip will continue to wait.  No indication for cesarean at this time.    Merla Riches, MD 3:45 AM

## 2014-09-28 NOTE — Anesthesia Postprocedure Evaluation (Signed)
  Anesthesia Post-op Note  Patient: Debra Mills  Procedure(s) Performed: Procedure(s): CESAREAN SECTION (N/A)  Patient Location: PACU  Anesthesia Type:Epidural  Level of Consciousness: awake  Airway and Oxygen Therapy: Patient Spontanous Breathing  Post-op Pain: mild  Post-op Assessment: Post-op Vital signs reviewed, Patient's Cardiovascular Status Stable, Respiratory Function Stable and Patent Airway  Post-op Vital Signs: Reviewed and stable  Last Vitals:  Filed Vitals:   09/28/14 1530  BP:   Pulse:   Temp: 37.3 C  Resp: 36    Complications: No apparent anesthesia complications

## 2014-09-28 NOTE — Op Note (Signed)
Debra Mills PROCEDURE DATE: 09/26/2014 - 09/28/2014  PREOPERATIVE DIAGNOSIS: Intrauterine pregnancy at  [redacted]w[redacted]d weeks gestation with arrest of descent  POSTOPERATIVE DIAGNOSIS: The same  PROCEDURE:    Low Transverse Cesarean Section  SURGEON:  Dr. Silas [place]  ASSISTANT: Dr. Emeterio Reeve  INDICATIONS: Debra Mills is a 29 y.o. G1P1001 at [redacted]w[redacted]d with GDMA1 diabetes who has had prolonged active phase.  Patient pushed x3 hours with no descent.  CS was recommended.  Patient asked to push one more hour.  At 4 hours of pushing, there was still no descent.  The risks of cesarean section discussed with the patient included but were not limited to: bleeding which may require transfusion or reoperation; infection which may require antibiotics; injury to bowel, bladder, ureters or other surrounding organs; injury to the fetus; need for additional procedures including hysterectomy in the event of a life-threatening hemorrhage; placental abnormalities wth subsequent pregnancies, incisional problems, thromboembolic phenomenon and other postoperative/anesthesia complications. The patient concurred with the proposed plan, giving informed written consent for the procedure.    FINDINGS:  Viable female  infant in cephalic presentation occiput posterior, 1,3,5 Apgars, Arterial cord gas = 7.27.  weight to be determined in 1 hour, clear amniotic fluid.  Intact placenta, three vessel cord.  Grossly normal uterus, ovaries and fallopian tubes. .   ANESTHESIA:    Epidural   ESTIMATED BLOOD LOSS: 800cc  SPECIMENS: Placenta sent to pathology  COMPLICATIONS: None immediate  PROCEDURE IN DETAIL:  The patient received intravenous antibiotics and had sequential compression devices applied to her lower extremities.  Spinal anesthesia was administered epidural anesthesia was dosed up to surgical level and was found to be adequate. She was then placed in lithotomy position with a leftward tilt, and prepped and draped in a sterile  manner.  A foley catheter was placed into her bladder and attached to constant gravity.  After an adequate timeout was performed, a Pfannenstiel skin incision was made with scalpel and carried through to the underlying layer of fascia. The fascia was incised in the midline and this incision was extended bilaterally using the Mayo scissors. Kocher clamps were applied to the superior aspect of the fascial incision and the underlying rectus muscles were dissected off bluntly. A similar process was carried out on the inferior aspect of the facial incision. The rectus muscles were separated in the midline bluntly and the peritoneum was entered bluntly.   A transverse hysterotomy was made with a scalpel and extended bilaterally bluntly. The bladder blade was then removed. CNM Williams elevated head vaginally to aid in safe delivery.  The infant was successfully delivered, and cord was clamped and cut and infant was handed over to awaiting neonatology team. Uterine massage was then administered and the placenta delivered intact with three-vessel cord. The uterus was cleared of clot and debris.  There was a small left extension of the uterine incision which was repaired with 0-Vicryl.  The hysterotomy was closed with 0 vicryl.  A second imbricating suture of 0-Vicryl was used to reinforce the incision and aid in hemostasis.  The peritoneum and rectus muscles were noted to be hemostatic.  One last look at the uterine incision also showed good hemostasis.  The peritoneum was closed with 0-Vicryl.  The fascia was closed with 0-Vicryl in a running fashion with good restoration of anatomy.  The subcutaneus tissue was copiously irrigated.  The skin was closed with 4-0 Vicryl in a subcuticular fashion.    Pt tolerated the procedure will.  All  counts were correct x2.  Pt went to the recovery room in stable condition.

## 2014-09-28 NOTE — Progress Notes (Signed)
Debra Mills is a 29 y.o. G1P0 at [redacted]w[redacted]d by LMP admitted for induction of labor due to Gestational diabetes.  Subjective:   Objective: BP 126/84 mmHg  Pulse 94  Temp(Src) 99.6 F (37.6 C) (Oral)  Resp 20  Ht 5\' 1"  (1.549 m)  Wt 82.555 kg (182 lb)  BMI 34.41 kg/m2  LMP 12/20/2013 I/O last 3 completed shifts: In: -  Out: 850 [Urine:850] Total I/O In: -  Out: 125 [Urine:125]  FHT:    Fetal Heart Rate A      Mode  External filed at 09/28/2014 0725    Baseline Rate (A)  155 bpm filed at 09/28/2014 1200    Variability  6-25 BPM filed at 09/28/2014 1200    Accelerations  None filed at 09/28/2014 1200    Decelerations  None filed at 09/28/2014 1200    Scalp Stimulation  Positive filed at 09/28/2014 0109       UC:   regular, every 2 minutes SVE:   Dilation: 10 Effacement (%): 100 Station: +1 Exam by:: Hansel Feinstein, CNM  Labs: Lab Results  Component Value Date   WBC 14.2* 09/26/2014   HGB 13.4 09/26/2014   HCT 37.9 09/26/2014   MCV 80.3 09/26/2014   PLT 154 09/26/2014    Assessment / Plan: Arrest of decent  Labor: second stage arrest Preeclampsia:  no signs or symptoms of toxicity Fetal Wellbeing:  Category I Pain Control:  Epidural I/D:  on gent and ampicillin Anticipated MOD:  offered cesarean section now but patient requests opportunity to push some more since no fetal distress, will re-examine   Dandra Shambaugh 09/28/2014, 12:35 PM

## 2014-09-28 NOTE — Anesthesia Preprocedure Evaluation (Addendum)
Anesthesia Evaluation  Patient identified by MRN, date of birth, ID band Patient awake    Reviewed: Allergy & Precautions, H&P , NPO status , Patient's Chart, lab work & pertinent test results  Airway Mallampati: II  TM Distance: >3 FB Neck ROM: full    Dental   Pulmonary neg pulmonary ROS,  breath sounds clear to auscultation        Cardiovascular negative cardio ROS  Rhythm:regular Rate:Normal     Neuro/Psych negative neurological ROS  negative psych ROS   GI/Hepatic negative GI ROS, Neg liver ROS,   Endo/Other  diabetes, Well Controlled, GestationalObesity  Renal/GU negative Renal ROS     Musculoskeletal   Abdominal   Peds  Hematology negative hematology ROS (+)   Anesthesia Other Findings   Reproductive/Obstetrics (+) Pregnancy IUP term Arrest of descent                            Anesthesia Physical  Anesthesia Plan  ASA: II and emergent  Anesthesia Plan: Epidural   Post-op Pain Management:    Induction:   Airway Management Planned: Natural Airway  Additional Equipment:   Intra-op Plan:   Post-operative Plan:   Informed Consent: I have reviewed the patients History and Physical, chart, labs and discussed the procedure including the risks, benefits and alternatives for the proposed anesthesia with the patient or authorized representative who has indicated his/her understanding and acceptance.   Dental Advisory Given  Plan Discussed with: Anesthesiologist, CRNA and Surgeon  Anesthesia Plan Comments: (Patient for urgent C/section for arrest of descent. Will use epidural for C/section. M. Royce Macadamia, MD)       Anesthesia Quick Evaluation                                  Anesthesia Evaluation  Patient identified by MRN, date of birth, ID band Patient awake    Reviewed: Allergy & Precautions, H&P , NPO status , Patient's Chart, lab work & pertinent test  results  Airway Mallampati: II  TM Distance: >3 FB Neck ROM: full    Dental   Pulmonary neg pulmonary ROS,  breath sounds clear to auscultation        Cardiovascular negative cardio ROS  Rhythm:regular Rate:Normal     Neuro/Psych negative neurological ROS  negative psych ROS   GI/Hepatic negative GI ROS, Neg liver ROS,   Endo/Other  diabetes  Renal/GU negative Renal ROS     Musculoskeletal   Abdominal   Peds  Hematology   Anesthesia Other Findings   Reproductive/Obstetrics                             Anesthesia Physical Anesthesia Plan  ASA: II  Anesthesia Plan: Epidural   Post-op Pain Management:    Induction:   Airway Management Planned:   Additional Equipment:   Intra-op Plan:   Post-operative Plan:   Informed Consent: I have reviewed the patients History and Physical, chart, labs and discussed the procedure including the risks, benefits and alternatives for the proposed anesthesia with the patient or authorized representative who has indicated his/her understanding and acceptance.   Dental Advisory Given  Plan Discussed with: Anesthesiologist  Anesthesia Plan Comments:         Anesthesia Quick Evaluation

## 2014-09-28 NOTE — Transfer of Care (Signed)
Immediate Anesthesia Transfer of Care Note  Patient: Debra Mills  Procedure(s) Performed: Procedure(s): CESAREAN SECTION (N/A)  Patient Location: PACU  Anesthesia Type:Epidural  Level of Consciousness: awake, alert  and oriented  Airway & Oxygen Therapy: Patient Spontanous Breathing  Post-op Assessment: Report given to RN and Post -op Vital signs reviewed and stable  Post vital signs: Reviewed and stable  Last Vitals:  Filed Vitals:   09/28/14 1402  BP: 121/103  Pulse: 113  Temp:   Resp:     Complications: No apparent anesthesia complications

## 2014-09-28 NOTE — Progress Notes (Signed)
Dr. Roselie Awkward at bedside discussing with pt plan of care, offered c-section based assessment, pt requesting to keep pushing at this time. Pt tearful, RN giving pt short break to discuss options with FOB

## 2014-09-28 NOTE — Progress Notes (Signed)
Patient ID: Debra Mills, female   DOB: 10/28/1985, 29 y.o.   MRN: [id number]   Pt pushing x4 hours.  Pt refused c/s after 3 hours.  Head not descended.  Feels ROA.  +1 station.   The risks of cesarean section discussed with the patient included but were not limited to: bleeding which may require transfusion or reoperation; infection which may require antibiotics; injury to bowel, bladder, ureters or other surrounding organs; injury to the fetus; need for additional procedures including hysterectomy in the event of a life-threatening hemorrhage; placental abnormalities wth subsequent pregnancies, incisional problems, thromboembolic phenomenon and other postoperative/anesthesia complications. Patient aware of that her prolonged second stage can result in difficulty extracting the head, extension of the uterine incision, hemorrhage, and hysterectomy.  The patient concurred with the proposed plan, giving informed written consent for the procedure.   Patient has had a fever in labor and antibiotics will continue x24 hours.

## 2014-09-29 ENCOUNTER — Encounter (HOSPITAL_COMMUNITY): Payer: Self-pay | Admitting: Obstetrics & Gynecology

## 2014-09-29 LAB — CBC
HCT: 28.1 % — ABNORMAL LOW (ref 36.0–46.0)
HEMOGLOBIN: 9.8 g/dL — AB (ref 12.0–15.0)
MCH: 28.2 pg (ref 26.0–34.0)
MCHC: 34.9 g/dL (ref 30.0–36.0)
MCV: 80.7 fL (ref 78.0–100.0)
Platelets: 100 10*3/uL — ABNORMAL LOW (ref 150–400)
RBC: 3.48 MIL/uL — AB (ref 3.87–5.11)
RDW: 13.9 % (ref 11.5–15.5)
WBC: 20.4 10*3/uL — AB (ref 4.0–10.5)

## 2014-09-29 MED ORDER — FERROUS SULFATE 325 (65 FE) MG PO TABS
325.0000 mg | ORAL_TABLET | Freq: Three times a day (TID) | ORAL | Status: DC
Start: 2014-09-29 — End: 2014-10-01
  Administered 2014-09-29 – 2014-10-01 (×7): 325 mg via ORAL
  Filled 2014-09-29 (×9): qty 1

## 2014-09-29 NOTE — Lactation Note (Signed)
This note was copied from the chart of [place]. Lactation Consultation Note  Patient Name: Debra Mills [id number] Date: 09/29/2014   RN Jerene Pitch called [facility] for assist with latching.  LC on evenings unable to assist at that time.  RN stated mom has flat nipples and was having difficulty latching infant with the last feeding attempted and asked for nipple shields.  Nipple shield #20 & 24 tubed for use with next latching.  [place] asked RN to call NICU [facility] tomorrow to set up feeding consultation with [facility] for further evaluation.  Lactation will follow-up with mom tomorrow.    Maternal Data    Feeding Feeding Type: Breast Fed Nipple Type: Regular  LATCH Score/Interventions Latch: Repeated attempts needed to sustain latch, nipple held in mouth throughout feeding, stimulation needed to elicit sucking reflex. Intervention(s): Adjust position;Assist with latch;Breast compression  Audible Swallowing: A few with stimulation Intervention(s): Skin to skin;Hand expression  Type of Nipple: Flat Intervention(s):  (called lactation for nipple shields)  Comfort (Breast/Nipple): Soft / non-tender     Hold (Positioning): Full assist, staff holds infant at breast  [facility] Score: 5  Lactation Tools Discussed/Used     Consult Status      [name] 09/29/2014, 9:40 PM

## 2014-09-29 NOTE — Addendum Note (Signed)
Addendum  created 09/29/14 0944 by Elenore Paddy, CRNA   Modules edited: Notes Section   Notes Section:  File: [id number]

## 2014-09-29 NOTE — Progress Notes (Signed)
Subjective: Postpartum Day 1: Cesarean Delivery Patient reports tolerating PO and + flatus.  Catheter out 0500. Has not voided yet. Ambulated once yesterday. Mild dizziness. Pumped ?once yesterday.   Objective: Vital signs in last 24 hours: Temp:  [97.9 F (36.6 C)-101.9 F (38.8 C)] 97.9 F (36.6 C) (05/26 0942) Pulse Rate:  [79-133] 84 (05/26 0942) Resp:  [16-36] 18 (05/26 0942) BP: (95-139)/(51-103) 95/60 mmHg (05/26 0942) SpO2:  [95 %-100 %] 96 % (05/26 0942)  Physical Exam:  General: alert, cooperative, appears stated age and no distress  Heart: One irreg beat, other RRR, No M/R/G Lung: CTAB Lochia: appropriate Uterine Fundus: firm, U/1 Incision: no significant drainage, Occlusive dressing present DVT Evaluation: Negative Homan's sign.   Recent Labs  09/26/14 1955 09/29/14 0515  HGB 13.4 9.8*  HCT 37.9 28.1*    Assessment/Plan: Status post Cesarean section. Doing well postoperatively.  Continue current care. Encouraged to ambulate at least three times today and pump every three hours.  Baby in NICU doing well per parents.  Mild anemia. FeSO4.  Manya Silvas 09/29/2014, 9:58 AM

## 2014-09-29 NOTE — Anesthesia Postprocedure Evaluation (Signed)
  Anesthesia Post-op Note  Patient: Debra Mills  Procedure(s) Performed: Procedure(s): CESAREAN SECTION (N/A)  Patient Location: PACU and Women's Unit  Anesthesia Type:Epidural  Level of Consciousness: awake, alert  and oriented  Airway and Oxygen Therapy: Patient Spontanous Breathing  Post-op Pain: mild  Post-op Assessment: Patient's Cardiovascular Status Stable, Respiratory Function Stable, No signs of Nausea or vomiting, Adequate PO intake, Pain level controlled, No headache, No backache, No residual numbness and No residual motor weakness  Post-op Vital Signs: Reviewed and stable  Last Vitals:  Filed Vitals:   09/29/14 0621  BP:   Pulse:   Temp: 37.6 C  Resp:     Complications: No apparent anesthesia complications

## 2014-09-29 NOTE — Lactation Note (Signed)
This note was copied from the chart of [name]. Lactation Consultation Note  Patient Name: Debra Mills [id number] Date: 09/29/2014 Reason for consult: Initial assessment;NICU baby NICU baby 19 hours of life. Mom has DEBP at bedside, and reports that she has pumped once last night. Enc mom to pump every 3 hours for 15 minutes. Enc mom to massage her breasts and hand express before and after pumping. Mom return-demonstrated hand expression, but no colostrum visible. Enc mom to take or send EBM to NICU for baby. Discussed normal progression of milk coming in. Mom given NICU booklet and [place] brochure, and is aware of OP/BFSG and [place] phone line assistance after D/C.   Maternal Data Has patient been taught Hand Expression?: Yes Does the patient have breastfeeding experience prior to this delivery?: No  Feeding    LATCH Score/Interventions                      Lactation Tools Discussed/Used Pump Review: Setup, frequency, and cleaning;Milk Storage Initiated by:: Bedside nurse. Date initiated:: 09/28/14   Consult Status Consult Status: Follow-up Date: 09/30/14 Follow-up type: In-patient    Inocente Salles 09/29/2014, 9:52 AM

## 2014-09-30 NOTE — Lactation Note (Signed)
This note was copied from the chart of [place]. Lactation Consultation Note      Patient Name: Debra Mills [id number] Date: 09/30/2014 Reason for consult: Follow-up assessment   With this mom and term baby, now 24 hours old and in the NICU. Mom has short, everted nipples. I assisted mom with latching baby in football hold. He latches easily but will not maintain latch. He is ad lib demand, but not bottle feeding well. Since he is used to a nipple, I tried a 20 nipple shield. He latched, and fell asleep with mom , skin to skin. The 20 was a lettle small for the base of mom's nipple, but with sucking, about half of her nipple filled the shield. I told mom to not worry about im feeding for now, and that I would follow up with her  In her room later today. Mom has easily expressed transitional milk.  Maternal Data    Feeding Feeding Type: Breast Fed Nipple Type: Regular Length of feed: 5 min  LATCH Score/Interventions Latch: Repeated attempts needed to sustain latch, nipple held in mouth throughout feeding, stimulation needed to elicit sucking reflex. Intervention(s): Adjust position;Assist with latch  Audible Swallowing: A few with stimulation Intervention(s): Hand expression;Skin to skin  Type of Nipple: Everted at rest and after stimulation (evert but short - 20 nipple shiled added, baby latched  but asleep after few suckles)  Comfort (Breast/Nipple): Soft / non-tender     Hold (Positioning): Assistance needed to correctly position infant at breast and maintain latch. Intervention(s): Breastfeeding basics reviewed;Support Pillows;Position options;Skin to skin  LATCH Score: 7  Lactation Tools Discussed/Used     Consult Status Consult Status: Follow-up Date: 09/30/14 Follow-up type: In-patient    Tonna Corner 09/30/2014, 12:03 PM

## 2014-09-30 NOTE — Progress Notes (Signed)
CSW attempted to meet with MOB to complete assessment due to NICU admission following traumatic birth, but she was not in her room at this time.  CSW will attempt again at a later time.

## 2014-09-30 NOTE — Lactation Note (Signed)
This note was copied from the chart of [place]. Lactation Consultation Note  Patient Name: Debra Mills [id number] Date: 09/30/2014 Reason for consult: Follow-up assessment   With this mom of a term baby, now 13 hours old. I met with mom earlier and did pumping teaching and hand expression. Mom was able to express some transitional milk. I loaned a DEP to mom and WIC was faxed.    Maternal Data    Feeding    LATCH Score/Interventions                      Lactation Tools Discussed/Used WIC Program: Yes (fax sent for DEp) Pump Review: Setup, frequency, and cleaning;Milk Storage;Other (comment) (premie setting, hand expression, NICU booklet)   Consult Status Consult Status: Follow-up Date: 10/01/14 Follow-up type: In-patient    Tonna Corner 09/30/2014, 8:16 PM

## 2014-09-30 NOTE — Progress Notes (Signed)
Post Partum Day 2 Subjective:  Debra Mills is a 29 y.o. G1P1001 [redacted]w[redacted]d s/p pLTCS.  No acute events overnight.  Pt denies problems with ambulating, voiding or po intake.  She denies nausea or vomiting.  Pain is moderately controlled.  She has had flatus. She has not had bowel movement.  Method of Feeding: pumping with supplement, baby currently in NICU  Objective: Blood pressure 99/58, pulse 87, temperature 98.9 F (37.2 C), temperature source Oral, resp. rate 15, height 5\' 1"  (1.549 m), weight 182 lb (82.555 kg), last menstrual period 12/20/2013, SpO2 97 %, unknown if currently breastfeeding.  Physical Exam:  General: alert, cooperative and no distress Lochia:normal flow Chest: CTAB Heart: RRR no m/r/g Abdomen: +BS, soft, nontender,  Uterine Fundus: firm, incision dressed DVT Evaluation: No evidence of DVT seen on physical exam. Extremities: no edema   Recent Labs  09/29/14 0515  HGB 9.8*  HCT 28.1*    Assessment/Plan:  ASSESSMENT: Debra Mills is a 29 y.o. G1P1001 [redacted]w[redacted]d s/p pLTCS  Plan for discharge tomorrow   LOS: 4 days   Yordan Martindale [name] 09/30/2014, 9:52 AM

## 2014-09-30 NOTE — Clinical Social Work Maternal (Signed)
CLINICAL SOCIAL WORK MATERNAL/CHILD NOTE  Patient Details  Name: Debra Mills MRN: [id number] Date of Birth: 07/29/1985  Date:  09/30/2014  Clinical Social Worker Initiating Note:  Alanzo Lamb E. Kearia Yin, LCSW Date/ Time Initiated:  09/30/14/1530     Child's Name:  Zain   Legal Guardian:   (Parents: Amna Dangler and Haydar Shaib)   Need for Interpreter:   (Parents declined need for interpreter, however , their first language is Arabic.  MOB is more fluent in English than FOB.)   Date of Referral:        Reason for Referral:   (No referral-NICU admission)   Referral Source:      Address:  [address] [place], [place] [address]  Phone number:  [phone]   Household Members:  Spouse   Natural Supports (not living in the home):      Professional Supports:     Employment:     Type of Work:     Education:      Financial Resources:  Medicaid   Other Resources:      Cultural/Religious Considerations Which May Impact Care:  First language is Arabic.    Strengths:  Ability to meet basic needs , Compliance with medical plan , Home prepared for child , Understanding of illness (CSW asked parents to obtain pediatrician list from NICU desk and identify a pediatricin who is accepting new Medicaid patients prior to baby's discharge.)   Risk Factors/Current Problems:  None   Cognitive State:  Alert , Linear Thinking    Mood/Affect:  Tearful    CSW Assessment: CSW met with parents in MOB's third floor room to introduce myself, complete assessment due to baby's admission to NICU and to offer support.  Parents were pleasant and welcoming of CSW's visit.  MOB was quiet, but talkative.  FOB speaks very limited English, but both declined need for interpreter.  CSW informed them that they can ask for an interpreter at any time.  They report having everything they need for baby at home.  CSW provided education on SIDS prevention and MOB states that she is aware of SIDS and these  precautions.  She reports that baby will be sleeping in his own bed.  They have not yet chosen a pediatrician, but will obtain a list from the NICU on their next visit with baby.   CSW asked MOB if she felt comfortable sharing her delivery experience and she became tearful.  She stated that she planned on a vaginal delivery, but that baby "got stuck" and she had to go to the OR for a c-section.  She commented that this was her first surgery.  She states that when her baby came out, he was not breathing.  CSW helped MOB process her emotions related to the delivery and provided awareness of the possibility that significant emotions related to her delivery may surface months from now.  She was very receptive to this information.  CSW explained that it can be beneficial to seek counseling if the emotions are troubling or interfering with daily life.  CSW then discussed signs and symptoms of PPD as well as common emotions during the first two weeks after baby's birth.  CSW asked that she commit to talk with CSW and or her doctor if she has concerns about her emotions at any time.  MOB agreed.  She remained tearful and FOB started to speak to her in Arabic and show her pictures of the baby on his cell phone.    He then said to CSW, "he's fine.  He's fine.  Why are you (MOB) crying."  CSW attempted to explain that MOB's emotions are normal and that it is okay for her to be crying.  CSW celebrated with FOB that baby is doing so well and all agreed that baby is in good care.  He said, "This is [place].  There are good doctors.  This is not [place]."  MOB, however, did not interpret what CSW was saying, so CSW is unsure how much FOB understood.  FOB seems very concerned about MOB and excited about the baby.  CSW asked if he has to return to [place], as there was a note in MOB's PNR that stated he was there for work at that time.  FOB said he did not.   CSW encouraged parents to call CSW any time, even if they have emotional  concerns after baby's discharge.  CSW provided contact information and thanked them for talking with CSW today.  Parents appeared very appreciative of the visit and thanked CSW.  CSW Plan/Description:  Patient/Family Education , Psychosocial Support and Ongoing Assessment of Needs    Jadalynn Burr [place], LCSW 09/30/2014, 4:41 PM 

## 2014-10-01 MED ORDER — OXYCODONE-ACETAMINOPHEN 5-325 MG PO TABS: 1.0000 | ORAL_TABLET | ORAL | Status: DC | PRN

## 2014-10-01 MED ORDER — IBUPROFEN 600 MG PO TABS: 600.0000 mg | ORAL_TABLET | Freq: Four times a day (QID) | ORAL | Status: DC

## 2014-10-01 NOTE — Progress Notes (Signed)
Discharge teaching complete. Pt understood all instructions and did not have any questions. Pt ambulated out of the hospital and discharged home to family.

## 2014-10-01 NOTE — Discharge Summary (Signed)
Obstetric Discharge Summary Reason for Admission: induction of labor Prenatal Procedures: ultrasound Intrapartum Procedures: cesarean: low cervical, transverse Postpartum Procedures: none Complications-Operative and Postpartum: none HEMOGLOBIN  Date Value Ref Range Status  09/29/2014 9.8* 12.0 - 15.0 g/dL Final  07/08/2014 12.9 g/dL Final   HCT  Date Value Ref Range Status  09/29/2014 28.1* 36.0 - 46.0 % Final    Physical Exam:  General: alert, cooperative and no distress Lochia: appropriate Uterine Fundus: firm Incision: healing well, no significant drainage, no dehiscence, no significant erythema, clean dry intact DVT Evaluation: No evidence of DVT seen on physical exam.  Discharge Diagnoses: Term Pregnancy-delivered  Pt was admitted for induction due to Class A1 DM, progressed to C/P. Pushed for 4 hours without descent and underwent a primary Caesarean section, without incident Post operative course has been unremarkable Randel Books is doing well in NICU  Discharge Information: Date: 10/01/2014 Activity: pelvic rest Diet: routine Medications: Ibuprofen and Percocet Condition: stable Instructions: refer to practice specific booklet Discharge to: home Follow-up Information    Follow up with [facility] On 10/13/2014.   Why:  post op visit   Contact information:   [place] [phone]      Newborn Data: Live born female  Birth Weight: 8 lb 1.8 oz (3680 g) APGAR: 1, 3  Home with NICU.  Xochil Shanker H 10/01/2014, 12:17 PM

## 2014-10-01 NOTE — Discharge Instructions (Signed)

## 2014-10-01 NOTE — Lactation Note (Signed)
This note was copied from the chart of [name]. Lactation Consultation Note  Patient Name: Debra Mills Date: 10/01/2014 Reason for consult: Follow-up assessment   Follow-up with mom at 27 hours old.  RN stated mom has been in NICU this morning.  Mom is being discharged today.  When [name] arrived at 11:00 am mom had not pumped yet today. Mom stated she has tried latching with the nipple shield.  Encouraged mom to have RN help her or call NICU LC for latching consult when she visits.   When [name] asked mom how many times a day should she pump she stated "3 times."  Reviewed with mom to pump at least 8 times per day every 2 hours during the day and at least once at night.   LC reviewed with mom pumping log in the NICU booklet, and LC marked in booklet frequency of pumping information just taught.  LC encouraged mom to keep pumping log in booklet.  Log started with current pumping; LC encouraged mom to fill out log at end of this session and continue log.   LC got mom her pump parts and mom began pumping.  LC increased flange size to #30 for a better fit so the nipple has free flow.  Colostrum began flowing.   LC showed mom how to use hands-on pumping for obtaining more milk and encouraged her to hand express at end of pumping session.   Mom is pumping about 2-3 ml with each pumping session and using colostrum collection containers to put colostrum in for transport to NICU. Mom verbalized understanding of washing parts, storing milk, and transporting milk back to NICU. Additional information reviewed in NICU booklet with mom.   Mom has DEBP WIC Loaner in room.  LC showed mom she needed to take the circular filters from top of pump home with her when she is discharged.   Instructed mom to call [name] on Monday to get appointment for picking up [name] pump and then returning hospital pump.   After [name] left room, RN stated mom got 5-8 ml colostrum with current pumping session.  Label ordered from  pharmacy for mom to take when she is discharged.      Maternal Data Formula Feeding for Exclusion: Yes Reason for exclusion: Admission to Intensive Care Unit (ICU) post-partum  Feeding Feeding Type: Bottle Fed - Formula Nipple Type: Regular Length of feed: 25 min  LATCH Score/Interventions Latch: Repeated attempts needed to sustain latch, nipple held in mouth throughout feeding, stimulation needed to elicit sucking reflex. Intervention(s): Adjust position;Assist with latch;Breast massage  Audible Swallowing: A few with stimulation  Type of Nipple: Everted at rest and after stimulation  Comfort (Breast/Nipple): Soft / non-tender     Hold (Positioning): Full assist, staff holds infant at breast  [facility] Score: 6  Lactation Tools Discussed/Used     Consult Status Consult Status: Complete    [name] 10/01/2014, 11:21 AM

## 2014-10-02 ENCOUNTER — Encounter (HOSPITAL_COMMUNITY): Payer: Self-pay

## 2014-10-02 ENCOUNTER — Inpatient Hospital Stay (HOSPITAL_COMMUNITY)
Admission: AD | Admit: 2014-10-02 | Discharge: 2014-10-02 | Disposition: A | Discharge status: Home / Self Care | Payer: Medicaid Other | Source: Ambulatory Visit | Attending: Family Medicine | Admitting: Family Medicine

## 2014-10-02 DIAGNOSIS — G8918 Other acute postprocedural pain: Secondary | ICD-10-CM | POA: Insufficient documentation

## 2014-10-02 DIAGNOSIS — Z841 Family history of disorders of kidney and ureter: Secondary | ICD-10-CM | POA: Diagnosis not present

## 2014-10-02 DIAGNOSIS — L7682 Other postprocedural complications of skin and subcutaneous tissue: Secondary | ICD-10-CM

## 2014-10-02 DIAGNOSIS — Z833 Family history of diabetes mellitus: Secondary | ICD-10-CM | POA: Diagnosis not present

## 2014-10-02 DIAGNOSIS — R208 Other disturbances of skin sensation: Secondary | ICD-10-CM | POA: Diagnosis not present

## 2014-10-02 LAB — URINALYSIS, ROUTINE W REFLEX MICROSCOPIC
Bilirubin Urine: NEGATIVE
GLUCOSE, UA: NEGATIVE mg/dL
KETONES UR: NEGATIVE mg/dL
Nitrite: NEGATIVE
PROTEIN: 30 mg/dL — AB
Specific Gravity, Urine: 1.015 (ref 1.005–1.030)
Urobilinogen, UA: 0.2 mg/dL (ref 0.0–1.0)
pH: 6 (ref 5.0–8.0)

## 2014-10-02 LAB — URINE MICROSCOPIC-ADD ON

## 2014-10-02 NOTE — Discharge Instructions (Signed)

## 2014-10-02 NOTE — MAU Provider Note (Signed)
History     CSN: [id number]  Arrival date and time: 10/02/14 2035   First Provider Initiated Contact with Patient 10/02/14 2122      Chief Complaint  Patient presents with  . Incisional Pain   HPI Comments: Debra Mills is a 29 y.o. G1P1001 who had a c-section on 09/28/14. She has been home without any problems, however today the pain was worse at the incision site. She has been taking ibuprofen and percocet. She rates her current pain 6/10.   Wound Check She was originally treated 3 to 5 days ago. Prior ED Treatment: c-section  The maximum temperature noted was less than 100.4 F. The temperature was taken using an oral thermometer. There has been no drainage from the wound. There is no redness present. There is no swelling present. The pain has new pain.      Past Medical History  Diagnosis Date  . Medical history non-contributory   . Gestational diabetes mellitus, antepartum   . Gestational diabetes     Past Surgical History  Procedure Laterality Date  . No past surgeries    . Cesarean section N/A 09/28/2014    Procedure: CESAREAN SECTION;  Surgeon: Guss Bunde, MD;  Location: [facility];  Service: Obstetrics;  Laterality: N/A;    Family History  Problem Relation Age of Onset  . Diabetes Father   . Kidney disease Father     benign tumor removed 1 kidney  . Birth defects Brother     heart defect    History  Substance Use Topics  . Smoking status: Never Smoker   . Smokeless tobacco: Never Used  . Alcohol Use: No    Allergies:  Allergies  Allergen Reactions  . Fish Allergy Hives    Prescriptions prior to admission  Medication Sig Dispense Refill Last Dose  . ibuprofen (ADVIL,MOTRIN) 600 MG tablet Take 1 tablet (600 mg total) by mouth every 6 (six) hours. 30 tablet 0   . oxyCODONE-acetaminophen (PERCOCET/ROXICET) 5-325 MG per tablet Take 1 tablet by mouth every 4 (four) hours as needed (for pain scale 4-7). 30 tablet 0   . Prenatal Vit-Fe Fumarate-FA (PRENATAL  MULTIVITAMIN) TABS tablet Take 1 tablet by mouth daily at 12 noon.   Past Week at Unknown time    ROS Physical Exam   Blood pressure 122/72, pulse 89, temperature 98.1 F (36.7 C), temperature source Oral, resp. rate 18, last menstrual period 12/20/2013, unknown if currently breastfeeding.  Physical Exam  Nursing note and vitals reviewed. Constitutional: She is oriented to person, place, and time. She appears well-developed and well-nourished. No distress.  Cardiovascular: Normal rate.   Respiratory: Effort normal.  GI: Soft. There is no tenderness. There is no rebound.  Incision is C/D/I, no erythema, well approximated, healing well. There is a 0.5cm area at the very end on the right side where there is some scabbing, and a bruise. Patient states that is the area where the pain is located.  Neurological: She is alert and oriented to person, place, and time.  Skin: Skin is warm and dry.    MAU Course  Procedures  MDM   Assessment and Plan   1. Pain at surgical incision    DC home Comfort measures reviewed  Neosporin to area PRN  Return to MAU as needed FU with OB as planned  Follow-up Information    Follow up with [facility].   Specialty:  Obstetrics and Gynecology   Why:  As scheduled   Contact  information:   [place] [phone]        Mathis Bud, CNM 9:32 PM   Mathis Bud 10/02/2014, 9:24 PM

## 2014-10-02 NOTE — MAU Note (Signed)
Pt presents complaining of increased pain at her c/section incision. Delivered 5/25. States it is worse with movement. Still has some vaginal bleeding but it is slowing down.

## 2014-10-03 ENCOUNTER — Ambulatory Visit: Payer: Self-pay

## 2014-10-03 NOTE — Lactation Note (Signed)
This note was copied from the chart of [place]. Lactation Consultation Note  Patient Name: Debra Mills ORVIF'B Date: 10/03/2014 Reason for consult: Follow-up assessment;NICU baby NICU baby 45 days old. Parents roomed-in with baby in NICU. Mom states that baby nursed approximately 4 times last night, and then she supplemented with bottle. Mom attempted to latch baby to right breast with transitional milk dripping all around baby's mouth. Baby not interested in latching as baby fed earlier. Mom has [facility] loaner. Enc mom to nurse baby often as her milk is coming in and discussed engorgement prevention/treatment. Enc mom to supplement with her own EBM first. Mom aware of OP/BFSG and [place] phone line assistance after D/C.   Maternal Data    Feeding Feeding Type: Formula Nipple Type: Regular Length of feed:  (fed by MOB, rooming in 210)  LATCH Score/Interventions Latch: Too sleepy or reluctant, no latch achieved, no sucking elicited.  Audible Swallowing: None  Type of Nipple: Everted at rest and after stimulation  Comfort (Breast/Nipple): Soft / non-tender     Hold (Positioning): No assistance needed to correctly position infant at breast.  LATCH Score: 6  Lactation Tools Discussed/Used     Consult Status Consult Status: PRN    Inocente Salles 10/03/2014, 9:59 AM

## 2014-11-09 ENCOUNTER — Ambulatory Visit: Payer: Medicaid Other | Admitting: Family Medicine

## 2014-12-01 ENCOUNTER — Ambulatory Visit (INDEPENDENT_AMBULATORY_CARE_PROVIDER_SITE_OTHER): Payer: Medicaid Other | Admitting: Medical

## 2014-12-01 ENCOUNTER — Encounter: Payer: Self-pay | Admitting: Medical

## 2014-12-01 DIAGNOSIS — Z8632 Personal history of gestational diabetes: Secondary | ICD-10-CM | POA: Insufficient documentation

## 2014-12-01 NOTE — Progress Notes (Signed)
Patient ID: Debra Mills, female   DOB: 07-21-1985, 29 y.o.   MRN: [id number] Subjective:     Debra Mills is a 29 y.o. female who presents for a postpartum visit. She is 2 months postpartum following a c-section. I have fully reviewed the prenatal and intrapartum course. The delivery was at [redacted]w[redacted]d gestational weeks. Outcome: primary cesarean section, low transverse incision. Anesthesia: epidural. Postpartum course has been mostly uncomplicated.  She reports having gone to the MAU because incision site was opening slightly and bleeding.  She was given a cream to put on site and site has healed.  No bleeding, discharge or fevers.  Baby's course has been complicated by respiratory distress/ failure that required 1 day of intubation and 10 days of NICU care. Baby is feeding by bottle - Similac. Bleeding no bleeding and she reports that she bled for 3 weeks following delivery and now has resumed normal periods.. Bowel function is normal. Bladder function is normal. Patient is sexually active. Contraception method is none. Postpartum depression screening: negative.  The following portions of the patient's history were reviewed and updated as appropriate: allergies, current medications, past family history, past medical history, past social history, past surgical history and problem list.  Review of Systems Pertinent items are noted in HPI.   Objective:    There were no vitals taken for this visit.  General:  alert, cooperative, appears stated age and no distress   Breasts:  negative  Lungs: clear to auscultation bilaterally  Heart:  regular rate and rhythm, S1, S2 normal, no murmur, click, rub or gallop  Abdomen: soft, non-tender; bowel sounds normal; no masses,  no organomegaly and c-section scar well healing.  no dehisence. no exudate. no erythema. no TTP   Vulva:  not evaluated  Vagina: not evaluated  Cervix:  not evaluated  Corpus: normal size, contour, position, consistency, mobility, non-tender   Adnexa:  normal adnexa  Rectal Exam: Not performed.        Assessment:     2 month postpartum exam. Pap smear not done at today's visit.   Plan:    1. Contraception: none 2. Pap needs to be scheduled for next 2-4 weeks. (h/o of LSIL in 04/2014).  Patient on period and is not comfortable performing pap today. 3. Follow up in 2-4wks.    Ashly M. Lajuana Ripple, DO PGY-2, [facility]    I have seen and evaluated the patient with the resident. I agree with the assessment and plan as written above.  Patient will scheduled to return in 2-4 weeks for repeat pap smear  Luvenia Redden, PA-C  12/01/2014 2:52 PM

## 2014-12-01 NOTE — Patient Instructions (Signed)
Schedule your pap smear to be done in the next 2 weeks.  You may otherwise resume all normal activities  Abnormal Pap Test Information During a Pap test, the cells on the surface of your cervix are checked to see if they look normal, abnormal, or if they show signs of having been altered by a certain type of virus called human papillomavirus, or HPV. Cervical cells that have been affected by HPV are called dysplasia. Dysplasia is not cancer, but describes abnormal cells found on the surface of the cervix. Depending on the degree of dysplasia, some of the cells may be considered pre-cancerous and may turn into cancer over time if follow up with a caregiver is delayed.  WHAT DOES AN ABNORMAL PAP TEST MEAN? Having an abnormal pap test does not mean that you have cancer. However, certain types of abnormal pap tests can be a sign that a person is at a higher risk of developing cancer. Your caregiver will want to do other tests to find out more about the abnormal cells. Your abnormal Pap test results could show:   Small and uncertain changes that should be carefully watched.   Cervical dysplasia that has caused mild changes and can be followed over time.  Cervical dysplasia that is more severe and needs to be followed and treated to ensure the problem goes away.  Cancer.  When severe cervical dysplasia is found and treated early, it rarely will grow into cancer.  WHAT WILL BE DONE ABOUT MY ABNORMAL PAP TEST?  A colposcopy may be needed. This is a procedure where your cervix is examined using light and magnification.  A small tissue sample of your cervix (biopsy) may need to be removed and then examined. This is often performed if there are areas that appear infected.  A sample of cells from the cervical canal may be removed with either a small brush or scraping instrument (curette). Based on the results of the procedures above, some caregivers may recommend either cryotherapy of the cervix or a  surgical LEEP where a portion of the cervix is removed. LEEP is short for "loop electrical excisional procedure." Rarely, a caregiver may recommend a cone biopsy.This is a procedure where a small, cone-shaped sample of your cervix is taken out. The part that is taken out is the area where the abnormal cells are.  WHAT IF I HAVE A DYSPLASIA OR A CANCER? You may be referred to a specialist. Radiation may also be a treatment for more advanced cancer. Having a hysterectomy is the last treatment option for dysplasia, but it is a more common treatment for someone with cancer. All treatment options will be discussed with you by your caregiver. WHAT SHOULD YOU DO AFTER BEING TREATED? If you have had an abnormal pap test, you should continue to have regular pap tests and check-ups as directed by your caregiver. Your cervical problem will be carefully watched so it does not get worse. Also, your caregiver can watch for, and treat, any new problems that may come up. Document Released: 08/07/2010 Document Revised: 08/17/2012 Document Reviewed: 04/18/2011 [facility] Patient Information 2015 [place], [place]. This information is not intended to replace advice given to you by your health care provider. Make sure you discuss any questions you have with your health care provider.

## 2014-12-01 NOTE — Progress Notes (Deleted)
Patient ID: Debra Mills, female   DOB: 08-03-1985, 29 y.o.   MRN: [id number] Subjective:     Debra Mills is a 29 y.o. female who presents for a postpartum visit. She is 9 weeks postpartum following a low cervical transverse Cesarean section. I have fully reviewed the prenatal and intrapartum course. The delivery was at 40 gestational weeks. Outcome: {delivery outcome:32078}. Anesthesia: spinal. Postpartum course has been ***. Baby's course has been ***. Baby is feeding by both breast and bottle - Similac Advance. Bleeding red. Bowel function is normal. Bladder function is normal. Patient is sexually active. Contraception method is OCP (estrogen/progesterone). Postpartum depression screening: negative.  {Common ambulatory SmartLinks:19316}  Review of Systems {ros; complete:30496}   Objective:    There were no vitals taken for this visit.  General:  {gen appearance:16600}   Breasts:  {breast exam:1202::"inspection negative, no nipple discharge or bleeding, no masses or nodularity palpable"}  Lungs: {lung exam:16931}  Heart:  {heart exam:5510}  Abdomen: {abdomen exam:16834}   Vulva:  {labia exam:12198}  Vagina: {vagina exam:12200}  Cervix:  {cervix exam:14595}  Corpus: {uterus exam:12215}  Adnexa:  {adnexa exam:12223}  Rectal Exam: {rectal/vaginal exam:12274}        Assessment:    *** postpartum exam. Pap smear {done:10129} at today's visit.   Plan:    1. Contraception: {method:5051} 2. *** 3. Follow up in: {1-10:13787} {time; units:19136} or as needed.

## 2014-12-21 ENCOUNTER — Ambulatory Visit: Payer: Medicaid Other | Admitting: Family Medicine

## 2015-03-07 ENCOUNTER — Emergency Department (HOSPITAL_COMMUNITY)
Admission: EM | Admit: 2015-03-07 | Discharge: 2015-03-07 | Disposition: A | Discharge status: Home / Self Care | Payer: Medicaid Other | Attending: Emergency Medicine | Admitting: Emergency Medicine

## 2015-03-07 ENCOUNTER — Encounter (HOSPITAL_COMMUNITY): Payer: Self-pay | Admitting: Cardiology

## 2015-03-07 DIAGNOSIS — K029 Dental caries, unspecified: Secondary | ICD-10-CM | POA: Diagnosis not present

## 2015-03-07 DIAGNOSIS — K047 Periapical abscess without sinus: Secondary | ICD-10-CM | POA: Insufficient documentation

## 2015-03-07 DIAGNOSIS — Z8632 Personal history of gestational diabetes: Secondary | ICD-10-CM | POA: Diagnosis not present

## 2015-03-07 DIAGNOSIS — K0889 Other specified disorders of teeth and supporting structures: Secondary | ICD-10-CM | POA: Diagnosis present

## 2015-03-07 MED ORDER — AMOXICILLIN-POT CLAVULANATE 875-125 MG PO TABS: 1.0000 | ORAL_TABLET | Freq: Two times a day (BID) | ORAL | Status: DC

## 2015-03-07 NOTE — Discharge Instructions (Signed)
You need to make an appointment with a dentist for you tooth / gum pain. In the meantime take the antibiotics as prescribed to treat your current infection. Return to emergency room if you develop trouble breathing or swallowing.  Dental Abscess A dental abscess is a collection of pus in or around a tooth. CAUSES This condition is caused by a bacterial infection around the root of the tooth that involves the inner part of the tooth (pulp). It may result from:  Severe tooth decay.  Trauma to the tooth that allows bacteria to enter into the pulp, such as a broken or chipped tooth.  Severe gum disease around a tooth. SYMPTOMS Symptoms of this condition include:  Severe pain in and around the infected tooth.  Swelling and redness around the infected tooth, in the mouth, or in the face.  Tenderness.  Pus drainage.  Bad breath.  Bitter taste in the mouth.  Difficulty swallowing.  Difficulty opening the mouth.  Nausea.  Vomiting.  Chills.  Swollen neck glands.  Fever. DIAGNOSIS This condition is diagnosed with examination of the infected tooth. During the exam, your dentist may tap on the infected tooth. Your dentist will also ask about your medical and dental history and may order X-rays. TREATMENT This condition is treated by eliminating the infection. This may be done with:  Antibiotic medicine.  A root canal. This may be performed to save the tooth.  Pulling (extracting) the tooth. This may also involve draining the abscess. This is done if the tooth cannot be saved. HOME CARE INSTRUCTIONS  Take medicines only as directed by your dentist.  If you were prescribed antibiotic medicine, finish all of it even if you start to feel better.  Rinse your mouth (gargle) often with salt water to relieve pain or swelling.  Do not drive or operate heavy machinery while taking pain medicine.  Do not apply heat to the outside of your mouth.  Keep all follow-up visits as  directed by your dentist. This is important. SEEK MEDICAL CARE IF:  Your pain is worse and is not helped by medicine. SEEK IMMEDIATE MEDICAL CARE IF:  You have a fever or chills.  Your symptoms suddenly get worse.  You have a very bad headache.  You have problems breathing or swallowing.  You have trouble opening your mouth.  You have swelling in your neck or around your eye.   This information is not intended to replace advice given to you by your health care provider. Make sure you discuss any questions you have with your health care provider.   Document Released: 04/22/2005 Document Revised: 09/06/2014 Document Reviewed: 04/19/2014 Elsevier Interactive Patient Education [employer].

## 2015-03-07 NOTE — ED Notes (Signed)
Reports left sided dental pain

## 2015-03-07 NOTE — ED Provider Notes (Signed)
CSN: [id number]     Arrival date & time 03/07/15  1419 History   First MD Initiated Contact with Patient 03/07/15 1449     Chief Complaint  Patient presents with  . Dental Pain     (Consider location/radiation/quality/duration/timing/severity/associated sxs/prior Treatment) Patient is a 29 y.o. female presenting with tooth pain. The history is provided by the patient.  Dental Pain Location:  Lower Lower teeth location:  23/LL lateral incisor Quality:  Aching Severity:  Mild Onset quality:  Gradual Duration:  2 days Timing:  Constant Progression:  Worsening Chronicity:  New Context: abscess, dental caries and poor dentition   Context: not dental fracture, filling still in place, not recent dental surgery and not trauma   Relieved by:  Nothing Worsened by:  Cold food/drink, hot food/drink and touching Ineffective treatments:  None tried Associated symptoms: facial pain and facial swelling   Associated symptoms: no congestion, no difficulty swallowing, no drooling, no fever, no neck pain, no neck swelling and no trismus   Risk factors: lack of dental care and periodontal disease   Risk factors: no cancer and no chewing tobacco use     Past Medical History  Diagnosis Date  . Medical history non-contributory   . Gestational diabetes mellitus, antepartum   . Gestational diabetes    Past Surgical History  Procedure Laterality Date  . No past surgeries    . Cesarean section N/A 09/28/2014    Procedure: CESAREAN SECTION;  Surgeon: Guss Bunde, MD;  Location: [facility];  Service: Obstetrics;  Laterality: N/A;   Family History  Problem Relation Age of Onset  . Diabetes Father   . Kidney disease Father     benign tumor removed 1 kidney  . Birth defects Brother     heart defect   Social History  Substance Use Topics  . Smoking status: Never Smoker   . Smokeless tobacco: Never Used  . Alcohol Use: No   OB History    Gravida Para Term Preterm AB TAB SAB Ectopic Multiple  Living   1 1 1       0 1     Review of Systems  Constitutional: Negative for fever, chills, activity change and appetite change.  HENT: Positive for dental problem and facial swelling. Negative for congestion, drooling, ear pain, postnasal drip, rhinorrhea and trouble swallowing.   Respiratory: Negative for cough, chest tightness and shortness of breath.   Cardiovascular: Negative for chest pain.  Gastrointestinal: Negative for abdominal pain.  Musculoskeletal: Negative for neck pain.      Allergies  Fish allergy  Home Medications   Prior to Admission medications   Medication Sig Start Date End Date Taking? Authorizing Provider  amoxicillin-clavulanate (AUGMENTIN) 875-125 MG tablet Take 1 tablet by mouth 2 (two) times daily. 03/07/15   Olam Idler, MD  ibuprofen (ADVIL,MOTRIN) 600 MG tablet Take 1 tablet (600 mg total) by mouth every 6 (six) hours. 10/01/14   Florian Buff, MD   BP 109/77 mmHg  Pulse 115  Temp(Src) 98.5 F (36.9 C) (Oral)  Resp 18  Wt 163 lb (73.936 kg)  SpO2 98% Physical Exam  Constitutional: She is oriented to person, place, and time. She appears well-developed and well-nourished. No distress.  HENT:  Mouth/Throat: No trismus in the jaw. Abnormal dentition. Dental abscesses and dental caries present.    Eyes: EOM are normal. Pupils are equal, round, and reactive to light.  Neck: Normal range of motion. Neck supple.  Cardiovascular: Normal rate, regular rhythm  and normal heart sounds.   No murmur heard. Pulmonary/Chest: Effort normal and breath sounds normal. No stridor. No respiratory distress.  Abdominal: Soft. Bowel sounds are normal. She exhibits no distension.  Lymphadenopathy:    She has no cervical adenopathy.  Neurological: She is alert and oriented to person, place, and time.  Skin: Skin is warm. No rash noted.    ED Course  Procedures (including critical care time) Labs Review Labs Reviewed - No data to display  Imaging Review No  results found. I have personally reviewed and evaluated these images and lab results as part of my medical decision-making.   EKG Interpretation None      MDM   Final diagnoses:  Dental abscess   She presents with two days of left lower dental pain and gum/facial swelling. Has been having dental pain and intolerance to hot/cold food for several months. No dental care in over 3 years. She is postpartum ~ 6 months and no longer breast feeding. No difficulties with breathing / swallowing. Given prescription for augmentin BID x 7 days and advised following up with dentist for cavities.     Olam Idler, MD 03/07/15 1511  Davonna Belling, MD 03/08/15 [phone]

## 2015-03-07 NOTE — ED Notes (Signed)
C/o left lower dental pain x 2 days. Left facial swelling today.

## 2015-12-24 ENCOUNTER — Inpatient Hospital Stay (HOSPITAL_COMMUNITY)
Admission: AD | Admit: 2015-12-24 | Discharge: 2015-12-24 | Disposition: A | Discharge status: Home / Self Care | Payer: Medicaid Other | Source: Ambulatory Visit | Attending: Obstetrics and Gynecology | Admitting: Obstetrics and Gynecology

## 2015-12-24 ENCOUNTER — Encounter (HOSPITAL_COMMUNITY): Payer: Self-pay

## 2015-12-24 DIAGNOSIS — R1032 Left lower quadrant pain: Secondary | ICD-10-CM | POA: Insufficient documentation

## 2015-12-24 DIAGNOSIS — R309 Painful micturition, unspecified: Secondary | ICD-10-CM | POA: Diagnosis present

## 2015-12-24 DIAGNOSIS — N390 Urinary tract infection, site not specified: Secondary | ICD-10-CM

## 2015-12-24 LAB — CBC
HCT: 38.4 % (ref 36.0–46.0)
HEMOGLOBIN: 13.9 g/dL (ref 12.0–15.0)
MCH: 27.5 pg (ref 26.0–34.0)
MCHC: 36.2 g/dL — ABNORMAL HIGH (ref 30.0–36.0)
MCV: 75.9 fL — ABNORMAL LOW (ref 78.0–100.0)
PLATELETS: 204 10*3/uL (ref 150–400)
RBC: 5.06 MIL/uL (ref 3.87–5.11)
RDW: 12.8 % (ref 11.5–15.5)
WBC: 19.1 10*3/uL — ABNORMAL HIGH (ref 4.0–10.5)

## 2015-12-24 LAB — URINE MICROSCOPIC-ADD ON

## 2015-12-24 LAB — DIFFERENTIAL
Basophils Absolute: 0 10*3/uL (ref 0.0–0.1)
Basophils Relative: 0 %
EOS PCT: 0 %
Eosinophils Absolute: 0.1 10*3/uL (ref 0.0–0.7)
Lymphocytes Relative: 15 %
Lymphs Abs: 2.8 10*3/uL (ref 0.7–4.0)
MONO ABS: 0.6 10*3/uL (ref 0.1–1.0)
MONOS PCT: 3 %
NEUTROS ABS: 14.7 10*3/uL — AB (ref 1.7–7.7)
Neutrophils Relative %: 82 %

## 2015-12-24 LAB — URINALYSIS, ROUTINE W REFLEX MICROSCOPIC
Bilirubin Urine: NEGATIVE
GLUCOSE, UA: NEGATIVE mg/dL
Ketones, ur: NEGATIVE mg/dL
Nitrite: POSITIVE — AB
PROTEIN: 100 mg/dL — AB
pH: 5.5 (ref 5.0–8.0)

## 2015-12-24 LAB — WET PREP, GENITAL
Clue Cells Wet Prep HPF POC: NONE SEEN
SPERM: NONE SEEN
TRICH WET PREP: NONE SEEN
YEAST WET PREP: NONE SEEN

## 2015-12-24 LAB — POCT PREGNANCY, URINE: Preg Test, Ur: NEGATIVE

## 2015-12-24 MED ORDER — PHENAZOPYRIDINE HCL 100 MG PO TABS: 100.0000 mg | ORAL_TABLET | Freq: Three times a day (TID) | ORAL | 0 refills | Status: DC | PRN

## 2015-12-24 MED ORDER — PHENAZOPYRIDINE HCL 200 MG PO TABS: 200.0000 mg | ORAL_TABLET | Freq: Three times a day (TID) | ORAL | 0 refills | Status: DC

## 2015-12-24 MED ORDER — NITROFURANTOIN MONOHYD MACRO 100 MG PO CAPS: 100.0000 mg | ORAL_CAPSULE | Freq: Two times a day (BID) | ORAL | 0 refills | Status: DC

## 2015-12-24 MED ORDER — CEPHALEXIN 500 MG PO CAPS: 500.0000 mg | ORAL_CAPSULE | Freq: Four times a day (QID) | ORAL | 0 refills | Status: DC

## 2015-12-24 MED ORDER — NITROFURANTOIN MONOHYD MACRO 100 MG PO CAPS
100.0000 mg | ORAL_CAPSULE | Freq: Once | ORAL | Status: AC
  Administered 2015-12-24: 100 mg via ORAL
  Filled 2015-12-24: qty 1

## 2015-12-24 MED ORDER — PHENAZOPYRIDINE HCL 100 MG PO TABS
200.0000 mg | ORAL_TABLET | Freq: Once | ORAL | Status: AC
  Administered 2015-12-24: 200 mg via ORAL
  Filled 2015-12-24: qty 2

## 2015-12-24 NOTE — MAU Provider Note (Signed)
S: Debra Mills is a 30 y.o. G1P1001 who presents today with abdominal pain. She was seen here earlier today, and given RX for antibiotic for UIT. She has not been able to get the rx filled, and the pain has returned.   O: VASS, Afebrile Sherolyn Buba D/W Dr. Glo Herring. She does not need rocephin injection today. Will change ABX to Keflex and give a printed RX she can take to 24 hour pharmacy.   A/P: 1. Urinary tract infection, site not specified    DC home Comfort measures reviewed  RX: keflex QID x 7 days, Pyridium 100mg  TID #10  Return to MAU as needed FU with OB as planned

## 2015-12-24 NOTE — Discharge Instructions (Signed)
Antibiotic Medicine °Antibiotic medicines are used to treat infections caused by bacteria. They work by injuring or killing the bacteria that is making you sick. °HOW IS AN ANTIBIOTIC CHOSEN? °An antibiotic is chosen based on many factors. To help your health care provider choose one for you, tell your health care provider if: °· You have any allergies. °· You are pregnant or plan to get pregnant. °· You are breastfeeding. °· You are taking any medicines. These include over-the-counter medicines, prescription medicines, and herbal remedies. °· You have a medical condition or problem you have not already discussed. °Your health care provider will also consider: °· How often the medicine has to be taken. °· Common side effects of the medicine. °· The cost of the medicine. °· The taste of the medicine. °If you have questions about why an antibiotic was chosen, make sure to ask. °FOR HOW LONG SHOULD I TAKE MY ANTIBIOTIC? °Continue to take your antibiotic for as long as told by your health care provider. Do not stop taking it when you feel better. If you stop taking it too soon: °· You may start to feel sick again. °· Your infection may become harder to treat. °· Complications may develop. °WHAT IF I MISS A DOSE? °Try not to miss any doses of medicine. If you miss a dose, take it as soon as possible. However, if it is almost time for the next dose: °· If you are taking 2 doses per day, take the missed dose and the next dose 5 to 6 hours apart. °· If you are taking 3 or more doses per day, take the missed dose and the next dose 2 to 4 hours apart, then go back to the normal schedule. °If you cannot make up a missed dose, take the next scheduled dose on time. Then take the missed dose after you have taken all the doses as recommended by your health care provider, as if you had one more dose left. °DO ANTIBIOTICS AFFECT BIRTH CONTROL? °Birth control pills may not work while you are on antibiotics. If you are taking birth  control pills, continue taking them as usual and use a second form of birth control, such as a condom, to avoid unwanted pregnancy. Continue using the second form of birth control until you are finished with your current 1 month cycle of birth control pills. °OTHER INFORMATION °· If there is any medicine left over, throw it away. °· Never take someone else's antibiotics. °· Never take leftover antibiotics. °SEEK MEDICAL CARE IF: °· You get worse. °· You do not feel better within a few days of starting the antibiotic medicine. °· You vomit. °· White patches appear in your mouth. °· You have new joint pain that begins after starting the antibiotic. °· You have new muscle aches that begin after starting the antibiotic. °· You had a fever before starting the antibiotic and it returns. °· You have any symptoms of an allergic reaction, such as an itchy rash. If this happens, stop taking the antibiotic. °SEEK IMMEDIATE MEDICAL CARE IF: °· Your urine turns dark or becomes blood-colored. °· Your skin turns yellow. °· You bruise or bleed easily. °· You have severe diarrhea and abdominal cramps. °· You have a severe headache. °· You have signs of a severe allergic reaction, such as: °¨ Trouble breathing. °¨ Wheezing. °¨ Swelling of the lips, tongue, or face. °¨ Fainting. °¨ Blisters on the skin or in the mouth. °If you have signs of a severe allergic   reaction, stop taking the antibiotic right away. °  °This information is not intended to replace advice given to you by your health care provider. Make sure you discuss any questions you have with your health care provider. °  °Document Released: 01/03/2004 Document Revised: 01/11/2015 Document Reviewed: 09/07/2014 °Elsevier Interactive Patient Education ©2016 Elsevier Inc[employer] ° °

## 2015-12-24 NOTE — MAU Note (Signed)
Pt presents stating she is still having pain. Went to pharmacy and it was closed so she did not pick up prescriptions from earlier.

## 2015-12-24 NOTE — MAU Provider Note (Signed)
History     CSN: [id number]  Arrival date and time: 12/24/15 1620   First Provider Initiated Contact with Patient 12/24/15 1720      Chief Complaint  Patient presents with  . Abdominal Pain  . Dysuria  . Back Pain   HPI Pt is 30 y.o.G1P1001 not pregnant who presents with pain with urination, lower left abdominal pain radiating to left side on and off for about 1 week.   Pt states she has also been trying to get pregnant for about 2 months RN note: Patient presents with onset of dysuria, back pain and abdominal pain x 1 week, denies fever or chills.  Past Medical History:  Diagnosis Date  . Gestational diabetes   . Gestational diabetes mellitus, antepartum   . Medical history non-contributory     Past Surgical History:  Procedure Laterality Date  . CESAREAN SECTION N/A 09/28/2014   Procedure: CESAREAN SECTION;  Surgeon: Guss Bunde, MD;  Location: [facility];  Service: Obstetrics;  Laterality: N/A;  . NO PAST SURGERIES      Family History  Problem Relation Age of Onset  . Diabetes Father   . Kidney disease Father     benign tumor removed 1 kidney  . Birth defects Brother     heart defect    Social History  Substance Use Topics  . Smoking status: Never Smoker  . Smokeless tobacco: Never Used  . Alcohol use No    Allergies:  Allergies  Allergen Reactions  . Fish Allergy Hives    Prescriptions Prior to Admission  Medication Sig Dispense Refill Last Dose  . acetaminophen (TYLENOL) 500 MG tablet Take 500 mg by mouth every 6 (six) hours as needed for moderate pain.   Past Month at Unknown time    Review of Systems  Constitutional: Negative for chills and fever.  Gastrointestinal: Positive for abdominal pain. Negative for diarrhea, nausea and vomiting.  Genitourinary: Positive for dysuria.  Musculoskeletal: Positive for back pain.  Neurological: Negative for headaches.   Physical Exam   Blood pressure 120/72, pulse 106, temperature 98.6 F (37 C), resp.  rate 18, height 5' 0.5" (1.537 m), weight 167 lb 1.3 oz (75.8 kg), last menstrual period 11/30/2015, unknown if currently breastfeeding.  Physical Exam  Nursing note and vitals reviewed. Constitutional: She is oriented to person, place, and time. She appears well-developed and well-nourished. No distress.  HENT:  Head: Normocephalic.  Eyes: Pupils are equal, round, and reactive to light.  Neck: Normal range of motion. Neck supple.  Cardiovascular: Normal rate.   Respiratory: Effort normal.  GI: Soft. She exhibits no distension. There is tenderness. There is no rebound and no guarding.  Mild LLQ tenderness and mild lower left back pain-  Genitourinary:  Genitourinary Comments: Vagina clean, cervix clean, NT; uterus NSSC , NT ; adnexa without palpable enlargement or tenderness  Musculoskeletal: Normal range of motion.  Neurological: She is alert and oriented to person, place, and time.  Skin: Skin is warm and dry.  Psychiatric: She has a normal mood and affect.    MAU Course  Procedures Results for orders placed or performed during the hospital encounter of 12/24/15 (from the past 24 hour(s))  Urinalysis, Routine w reflex microscopic (not at [facility])     Status: Abnormal   Collection Time: 12/24/15  4:30 PM  Result Value Ref Range   Color, Urine YELLOW YELLOW   APPearance CLOUDY (A) CLEAR   Specific Gravity, Urine >1.030 (H) 1.005 - 1.030  pH 5.5 5.0 - 8.0   Glucose, UA NEGATIVE NEGATIVE mg/dL   Hgb urine dipstick LARGE (A) NEGATIVE   Bilirubin Urine NEGATIVE NEGATIVE   Ketones, ur NEGATIVE NEGATIVE mg/dL   Protein, ur 100 (A) NEGATIVE mg/dL   Nitrite POSITIVE (A) NEGATIVE   Leukocytes, UA MODERATE (A) NEGATIVE  Urine microscopic-add on     Status: Abnormal   Collection Time: 12/24/15  4:30 PM  Result Value Ref Range   Squamous Epithelial / LPF 0-5 (A) NONE SEEN   WBC, UA 0-5 0 - 5 WBC/hpf   RBC / HPF 6-30 0 - 5 RBC/hpf   Bacteria, UA FEW (A) NONE SEEN  Pregnancy, urine POC      Status: None   Collection Time: 12/24/15  4:35 PM  Result Value Ref Range   Preg Test, Ur NEGATIVE NEGATIVE  Wet prep, genital     Status: Abnormal   Collection Time: 12/24/15  5:26 PM  Result Value Ref Range   Yeast Wet Prep HPF POC NONE SEEN NONE SEEN   Trich, Wet Prep NONE SEEN NONE SEEN   Clue Cells Wet Prep HPF POC NONE SEEN NONE SEEN   WBC, Wet Prep HPF POC FEW (A) NONE SEEN   Sperm NONE SEEN   CBC     Status: Abnormal   Collection Time: 12/24/15  5:33 PM  Result Value Ref Range   WBC 19.1 (H) 4.0 - 10.5 K/uL   RBC 5.06 3.87 - 5.11 MIL/uL   Hemoglobin 13.9 12.0 - 15.0 g/dL   HCT 38.4 36.0 - 46.0 %   MCV 75.9 (L) 78.0 - 100.0 fL   MCH 27.5 26.0 - 34.0 pg   MCHC 36.2 (H) 30.0 - 36.0 g/dL   RDW 12.8 11.5 - 15.5 %   Platelets 204 150 - 400 K/uL  Differential     Status: Abnormal   Collection Time: 12/24/15  5:33 PM  Result Value Ref Range   Neutrophils Relative % 82 %   Neutro Abs 14.7 (H) 1.7 - 7.7 K/uL   Lymphocytes Relative 15 %   Lymphs Abs 2.8 0.7 - 4.0 K/uL   Monocytes Relative 3 %   Monocytes Absolute 0.6 0.1 - 1.0 K/uL   Eosinophils Relative 0 %   Eosinophils Absolute 0.1 0.0 - 0.7 K/uL   Basophils Relative 0 %   Basophils Absolute 0.0 0.0 - 0.1 K/uL  urine culture pending GC/Chlamydia pending Macrobid 1 dose and pyridium 1 dose in MAU Pt afebrile and not having any pain at discharge  Assessment and Plan  UTI- Rx Macrobid and Pyridium Urine culture pending If pt has chills or fever to go to [facility] ED F/u in [facility] 12/24/2015, 5:37 PM

## 2015-12-24 NOTE — MAU Note (Signed)
Patient presents with onset of dysuria, back pain and abdominal pain x 1 week, denies fever or chills.

## 2015-12-25 LAB — GC/CHLAMYDIA PROBE AMP (~~LOC~~) NOT AT ARMC
Chlamydia: NEGATIVE
NEISSERIA GONORRHEA: NEGATIVE

## 2015-12-26 LAB — URINE CULTURE: Culture: >=100000 — AB

## 2016-01-26 ENCOUNTER — Encounter: Payer: Self-pay | Admitting: Family Medicine

## 2016-01-26 ENCOUNTER — Ambulatory Visit (INDEPENDENT_AMBULATORY_CARE_PROVIDER_SITE_OTHER): Payer: Medicaid Other | Admitting: Obstetrics & Gynecology

## 2016-01-26 ENCOUNTER — Other Ambulatory Visit (HOSPITAL_COMMUNITY)
Admission: RE | Admit: 2016-01-26 | Discharge: 2016-01-26 | Disposition: A | Discharge status: Home / Self Care | Payer: Medicaid Other | Source: Ambulatory Visit | Attending: Obstetrics & Gynecology | Admitting: Obstetrics & Gynecology

## 2016-01-26 VITALS — BP 110/75 | HR 79 | Ht 62.0 in | Wt 169.9 lb

## 2016-01-26 DIAGNOSIS — Z01419 Encounter for gynecological examination (general) (routine) without abnormal findings: Secondary | ICD-10-CM | POA: Insufficient documentation

## 2016-01-26 DIAGNOSIS — Z124 Encounter for screening for malignant neoplasm of cervix: Secondary | ICD-10-CM

## 2016-01-26 NOTE — Progress Notes (Signed)
Subjective:    Debra Mills is a 30 y.o. M [place] P1(1 yo son)female who presents for an annual exam. The patient has no complaints today. The patient is sexually active. GYN screening history: last pap: was normal. The patient wears seatbelts: yes. The patient participates in regular exercise: no. Has the patient ever been transfused or tattooed?: no. The patient reports that there is not domestic violence in her life. She would like another pregnancy and is not using contraception.  Menstrual History: OB History    Gravida Para Term Preterm AB Living   1 1 1     1    SAB TAB Ectopic Multiple Live Births         0 1      Menarche age: 89 No LMP recorded.    The following portions of the patient's history were reviewed and updated as appropriate: allergies, current medications, past family history, past medical history, past social history, past surgical history and problem list.  Review of Systems Pertinent items are noted in HPI.   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, married for 2 years Objective:    BP 110/75   Pulse 79   Ht 5\' 2"  (1.575 m)   Wt 169 lb 14.4 oz (77.1 kg)   BMI 31.08 kg/m   General Appearance:    Alert, cooperative, no distress, appears stated age  Head:    Normocephalic, without obvious abnormality, atraumatic  Eyes:    PERRL, conjunctiva/corneas clear, EOM's intact, fundi    benign, both eyes  Ears:    Normal TM's and external ear canals, both ears  Nose:   Nares normal, septum midline, mucosa normal, no drainage    or sinus tenderness  Throat:   Lips, mucosa, and tongue normal; teeth and gums normal  Neck:   Supple, symmetrical, trachea midline, no adenopathy;    thyroid:  no enlargement/tenderness/nodules; no carotid   bruit or JVD  Back:     Symmetric, no curvature, ROM normal, no CVA tenderness  Lungs:     Clear to auscultation bilaterally, respirations unlabored  Chest Wall:    No tenderness or deformity   Heart:    Regular rate and rhythm, S1 and S2  normal, no murmur, rub   or gallop  Breast Exam:    No tenderness, masses, or nipple abnormality  Abdomen:     Soft, non-tender, bowel sounds active all four quadrants,    no masses, no organomegaly, obese  Genitalia:    Normal female without lesion, discharge or tenderness, NSSA, NT, mobile, no palpable adnexal masses     Extremities:   Extremities normal, atraumatic, no cyanosis or edema  Pulses:   2+ and symmetric all extremities  Skin:   Skin color, texture, turgor normal, no rashes or lesions  Lymph nodes:   Cervical, supraclavicular, and axillary nodes normal  Neurologic:   CNII-XII intact, normal strength, sensation and reflexes    throughout   .    Assessment:    Healthy female exam.   H/o GDM- schedule 2 hour GTT Desire for pregnancy- Rec MVI daily   Plan:     Breast self exam technique reviewed and patient encouraged to perform self-exam monthly. Thin prep Pap smear. with cotesting

## 2016-01-29 LAB — CYTOLOGY - PAP

## 2016-02-09 ENCOUNTER — Inpatient Hospital Stay (HOSPITAL_COMMUNITY)
Admission: AD | Admit: 2016-02-09 | Discharge: 2016-02-10 | Disposition: A | Discharge status: Home / Self Care | Payer: Medicaid Other | Source: Ambulatory Visit | Attending: Obstetrics & Gynecology | Admitting: Obstetrics & Gynecology

## 2016-02-09 ENCOUNTER — Inpatient Hospital Stay (HOSPITAL_COMMUNITY): Payer: Medicaid Other

## 2016-02-09 ENCOUNTER — Encounter (HOSPITAL_COMMUNITY): Payer: Self-pay | Admitting: *Deleted

## 2016-02-09 DIAGNOSIS — R109 Unspecified abdominal pain: Secondary | ICD-10-CM | POA: Diagnosis not present

## 2016-02-09 DIAGNOSIS — O26892 Other specified pregnancy related conditions, second trimester: Secondary | ICD-10-CM | POA: Insufficient documentation

## 2016-02-09 DIAGNOSIS — Z3A01 Less than 8 weeks gestation of pregnancy: Secondary | ICD-10-CM | POA: Insufficient documentation

## 2016-02-09 DIAGNOSIS — O26891 Other specified pregnancy related conditions, first trimester: Secondary | ICD-10-CM | POA: Diagnosis not present

## 2016-02-09 DIAGNOSIS — O3680X0 Pregnancy with inconclusive fetal viability, not applicable or unspecified: Secondary | ICD-10-CM

## 2016-02-09 DIAGNOSIS — O26899 Other specified pregnancy related conditions, unspecified trimester: Secondary | ICD-10-CM

## 2016-02-09 DIAGNOSIS — O219 Vomiting of pregnancy, unspecified: Secondary | ICD-10-CM

## 2016-02-09 DIAGNOSIS — O210 Mild hyperemesis gravidarum: Secondary | ICD-10-CM | POA: Diagnosis present

## 2016-02-09 LAB — URINALYSIS, ROUTINE W REFLEX MICROSCOPIC
BILIRUBIN URINE: NEGATIVE
GLUCOSE, UA: NEGATIVE mg/dL
KETONES UR: NEGATIVE mg/dL
Nitrite: NEGATIVE
Protein, ur: NEGATIVE mg/dL
Specific Gravity, Urine: 1.025 (ref 1.005–1.030)
pH: 6 (ref 5.0–8.0)

## 2016-02-09 LAB — POCT PREGNANCY, URINE: Preg Test, Ur: POSITIVE — AB

## 2016-02-09 LAB — WET PREP, GENITAL
Clue Cells Wet Prep HPF POC: NONE SEEN
SPERM: NONE SEEN
Trich, Wet Prep: NONE SEEN
Yeast Wet Prep HPF POC: NONE SEEN

## 2016-02-09 LAB — CBC
HEMATOCRIT: 37.3 % (ref 36.0–46.0)
Hemoglobin: 13.1 g/dL (ref 12.0–15.0)
MCH: 27.2 pg (ref 26.0–34.0)
MCHC: 35.1 g/dL (ref 30.0–36.0)
MCV: 77.5 fL — AB (ref 78.0–100.0)
PLATELETS: 227 10*3/uL (ref 150–400)
RBC: 4.81 MIL/uL (ref 3.87–5.11)
RDW: 13 % (ref 11.5–15.5)
WBC: 13.8 10*3/uL — ABNORMAL HIGH (ref 4.0–10.5)

## 2016-02-09 LAB — URINE MICROSCOPIC-ADD ON

## 2016-02-09 LAB — HCG, QUANTITATIVE, PREGNANCY: hCG, Beta Chain, Quant, S: 137 m[IU]/mL — ABNORMAL HIGH (ref <5)

## 2016-02-09 NOTE — MAU Provider Note (Signed)
History     CSN: [id number]  Arrival date and time: 02/09/16 2208   First Provider Initiated Contact with Patient 02/09/16 2251      Chief Complaint  Patient presents with  . Morning Sickness  . Abdominal Cramping   HPI  Debra Mills is a 30 y.o. G2P1001 at [redacted]w[redacted]d by LMP who presents with abdominal cramping and n/v. LMP 12/27/15. Had faintly positive pregnancy test a few days ago. Reports daily nausea and vomiting for the last 2 weeks. Has vomited once today, this morning, and continues to be nauseated. Intermittent lower abdominal cramping x 1 week that is worse on the right side. Rates 6/10. Has not treated. Nothing makes better or worse. Denies vaginal bleeding, diarrhea, constipation, dysuria, or fever. Last BM this morning without issue.   OB History    Gravida Para Term Preterm AB Living   2 1 1     1    SAB TAB Ectopic Multiple Live Births         0 1      Past Medical History:  Diagnosis Date  . Gestational diabetes     Past Surgical History:  Procedure Laterality Date  . CESAREAN SECTION N/A 09/28/2014   Procedure: CESAREAN SECTION;  Surgeon: Guss Bunde, MD;  Location: [facility];  Service: Obstetrics;  Laterality: N/A;    Family History  Problem Relation Age of Onset  . Diabetes Father   . Kidney disease Father     benign tumor removed 1 kidney  . Birth defects Brother     heart defect    Social History  Substance Use Topics  . Smoking status: Never Smoker  . Smokeless tobacco: Never Used  . Alcohol use No    Allergies:  Allergies  Allergen Reactions  . Fish Allergy Hives    Prescriptions Prior to Admission  Medication Sig Dispense Refill Last Dose  . acetaminophen (TYLENOL) 500 MG tablet Take 500 mg by mouth every 6 (six) hours as needed for moderate pain.   Past Week at Unknown time    Review of Systems  Constitutional: Negative for fever.  Gastrointestinal: Positive for abdominal pain, nausea and vomiting. Negative for constipation and diarrhea.   Genitourinary: Negative.    Physical Exam   Blood pressure 117/69, pulse 83, temperature 98.1 F (36.7 C), resp. rate 18, height 5\' 2"  (1.575 m), weight 171 lb 3.2 oz (77.7 kg), last menstrual period 12/27/2015, unknown if currently breastfeeding.  Physical Exam  Nursing note and vitals reviewed. Constitutional: She is oriented to person, place, and time. She appears well-developed and well-nourished. No distress.  HENT:  Head: Normocephalic and atraumatic.  Eyes: Conjunctivae are normal. Right eye exhibits no discharge. Left eye exhibits no discharge. No scleral icterus.  Neck: Normal range of motion.  Cardiovascular: Normal rate, regular rhythm and normal heart sounds.   No murmur heard. Respiratory: Effort normal and breath sounds normal. No respiratory distress. She has no wheezes.  GI: Soft. Bowel sounds are normal. She exhibits no distension. There is no tenderness. There is no rebound and no guarding.  Genitourinary: Uterus normal. Cervix exhibits no motion tenderness. Right adnexum displays no mass and no tenderness. Left adnexum displays no mass and no tenderness.  Genitourinary Comments: Cervix closed  Neurological: She is alert and oriented to person, place, and time.  Skin: Skin is warm and dry. She is not diaphoretic.  Psychiatric: She has a normal mood and affect. Her behavior is normal. Judgment and thought content normal.  MAU Course  Procedures Results for orders placed or performed during the hospital encounter of 02/09/16 (from the past 24 hour(s))  Urinalysis, Routine w reflex microscopic (not at [facility])     Status: Abnormal   Collection Time: 02/09/16 10:30 PM  Result Value Ref Range   Color, Urine YELLOW YELLOW   APPearance CLEAR CLEAR   Specific Gravity, Urine 1.025 1.005 - 1.030   pH 6.0 5.0 - 8.0   Glucose, UA NEGATIVE NEGATIVE mg/dL   Hgb urine dipstick TRACE (A) NEGATIVE   Bilirubin Urine NEGATIVE NEGATIVE   Ketones, ur NEGATIVE NEGATIVE mg/dL    Protein, ur NEGATIVE NEGATIVE mg/dL   Nitrite NEGATIVE NEGATIVE   Leukocytes, UA SMALL (A) NEGATIVE  Urine microscopic-add on     Status: Abnormal   Collection Time: 02/09/16 10:30 PM  Result Value Ref Range   Squamous Epithelial / LPF 6-30 (A) NONE SEEN   WBC, UA 6-30 0 - 5 WBC/hpf   RBC / HPF 0-5 0 - 5 RBC/hpf   Bacteria, UA FEW (A) NONE SEEN  Pregnancy, urine POC     Status: Abnormal   Collection Time: 02/09/16 10:51 PM  Result Value Ref Range   Preg Test, Ur POSITIVE (A) NEGATIVE  CBC     Status: Abnormal   Collection Time: 02/09/16 11:02 PM  Result Value Ref Range   WBC 13.8 (H) 4.0 - 10.5 K/uL   RBC 4.81 3.87 - 5.11 MIL/uL   Hemoglobin 13.1 12.0 - 15.0 g/dL   HCT 37.3 36.0 - 46.0 %   MCV 77.5 (L) 78.0 - 100.0 fL   MCH 27.2 26.0 - 34.0 pg   MCHC 35.1 30.0 - 36.0 g/dL   RDW 13.0 11.5 - 15.5 %   Platelets 227 150 - 400 K/uL  hCG, quantitative, pregnancy     Status: Abnormal   Collection Time: 02/09/16 11:02 PM  Result Value Ref Range   hCG, Beta Chain, Quant, S 137 (H) <5 mIU/mL  Wet prep, genital     Status: Abnormal   Collection Time: 02/09/16 11:25 PM  Result Value Ref Range   Yeast Wet Prep HPF POC NONE SEEN NONE SEEN   Trich, Wet Prep NONE SEEN NONE SEEN   Clue Cells Wet Prep HPF POC NONE SEEN NONE SEEN   WBC, Wet Prep HPF POC FEW (A) NONE SEEN   Sperm NONE SEEN    US Ob Comp Less 14 Wks  Result Date: 02/10/2016 CLINICAL DATA:  Acute onset of pelvic cramping.  Initial encounter. EXAM: OBSTETRIC <14 WK [place] AND TRANSVAGINAL OB US TECHNIQUE: Both transabdominal and transvaginal ultrasound examinations were performed for complete evaluation of the gestation as well as the maternal uterus, adnexal regions, and pelvic cul-de-sac. Transvaginal technique was performed to assess early pregnancy. COMPARISON:  Pelvic ultrasound performed 09/12/2014 FINDINGS: Intrauterine gestational sac: None seen. Yolk sac:  N/A Embryo:  N/A Subchorionic hemorrhage:  None visualized. Maternal  uterus/adnexae: The uterus is unremarkable in appearance. The ovaries are within normal limits the right ovary measures 2.8 x 2.2 x 2.2 cm, while the left ovary measures 3.3 x 2.8 x 2.7 cm. No suspicious adnexal masses are seen. There is no evidence for ovarian torsion. Trace free fluid is seen within the pelvic cul-de-sac. IMPRESSION: No intrauterine gestational sac seen. No evidence for ectopic pregnancy at this time. This remains within normal limits, given the quantitative beta HCG level of 137. If the quantitative beta HCG level continues to rise, would perform follow-up pelvic ultrasound in  2 weeks for further evaluation. Electronically Signed   By: Garald Balding M.D.   On: 02/10/2016 00:55   US Ob Transvaginal  Result Date: 02/10/2016 CLINICAL DATA:  Acute onset of pelvic cramping.  Initial encounter. EXAM: OBSTETRIC <14 WK [place] AND TRANSVAGINAL OB US TECHNIQUE: Both transabdominal and transvaginal ultrasound examinations were performed for complete evaluation of the gestation as well as the maternal uterus, adnexal regions, and pelvic cul-de-sac. Transvaginal technique was performed to assess early pregnancy. COMPARISON:  Pelvic ultrasound performed 09/12/2014 FINDINGS: Intrauterine gestational sac: None seen. Yolk sac:  N/A Embryo:  N/A Subchorionic hemorrhage:  None visualized. Maternal uterus/adnexae: The uterus is unremarkable in appearance. The ovaries are within normal limits the right ovary measures 2.8 x 2.2 x 2.2 cm, while the left ovary measures 3.3 x 2.8 x 2.7 cm. No suspicious adnexal masses are seen. There is no evidence for ovarian torsion. Trace free fluid is seen within the pelvic cul-de-sac. IMPRESSION: No intrauterine gestational sac seen. No evidence for ectopic pregnancy at this time. This remains within normal limits, given the quantitative beta HCG level of 137. If the quantitative beta HCG level continues to rise, would perform follow-up pelvic ultrasound in 2 weeks for further  evaluation. Electronically Signed   By: Garald Balding M.D.   On: 02/10/2016 00:55    MDM +UPT UA, wet prep, GC/chlamydia, CBC, ABO/Rh, quant hCG, HIV, and [place] today to rule out ectopic pregnancy BHCG 137 Ultrasound -- no IUP or adnexal mass Phenergan 25 mg PO  Assessment and Plan  A: 1. Pregnancy of unknown anatomic location   2. Abdominal pain affecting pregnancy   3. Nausea and vomiting during pregnancy prior to [redacted] weeks gestation    P: Discharge home Ectopic vs SAB precautions Go to [facility] Monday morning for repeat BHCG Rx [place] 02/09/2016, 10:51 PM

## 2016-02-09 NOTE — MAU Note (Signed)
LMP 8/23. Have had n/v in morning for a wk and some mid to lower abd pain for wk. Pain is crampy. Some white, mucousy d/c

## 2016-02-10 DIAGNOSIS — O26891 Other specified pregnancy related conditions, first trimester: Secondary | ICD-10-CM | POA: Diagnosis not present

## 2016-02-10 DIAGNOSIS — R109 Unspecified abdominal pain: Secondary | ICD-10-CM | POA: Diagnosis not present

## 2016-02-10 DIAGNOSIS — O219 Vomiting of pregnancy, unspecified: Secondary | ICD-10-CM

## 2016-02-10 LAB — HIV ANTIBODY (ROUTINE TESTING W REFLEX): HIV Screen 4th Generation wRfx: NONREACTIVE

## 2016-02-10 MED ORDER — PROMETHAZINE HCL 25 MG PO TABS: 25.0000 mg | ORAL_TABLET | Freq: Four times a day (QID) | ORAL | 0 refills | Status: DC | PRN

## 2016-02-10 MED ORDER — PROMETHAZINE HCL 25 MG PO TABS
25.0000 mg | ORAL_TABLET | Freq: Once | ORAL | Status: AC
  Administered 2016-02-10: 25 mg via ORAL
  Filled 2016-02-10: qty 1

## 2016-02-10 NOTE — Progress Notes (Signed)
Written and verbal d/c instructions given and understanding voiced. To return Monday 1100 at clinic for repeat labs and understanding voiced

## 2016-02-10 NOTE — Discharge Instructions (Signed)
Return to care   If you have heavier bleeding that soaks through more that 2 pads per hour for an hour or more  If you bleed so much that you feel like you might pass out or you do pass out  If you have significant abdominal pain that is not improved with Tylenol   If you develop a fever > 100.5     Abdominal Pain During Pregnancy Belly (abdominal) pain is common during pregnancy. Most of the time, it is not a serious problem. Other times, it can be a sign that something is wrong with the pregnancy. Always tell your doctor if you have belly pain. HOME CARE Monitor your belly pain for any changes. The following actions may help you feel better:  Do not have sex (intercourse) or put anything in your vagina until you feel better.  Rest until your pain stops.  Drink clear fluids if you feel sick to your stomach (nauseous). Do not eat solid food until you feel better.  Only take medicine as told by your doctor.  Keep all doctor visits as told. GET HELP RIGHT AWAY IF:   You are bleeding, leaking fluid, or pieces of tissue come out of your vagina.  You have more pain or cramping.  You keep throwing up (vomiting).  You have pain when you pee (urinate) or have blood in your pee.  You have a fever.  You do not feel your baby moving as much.  You feel very weak or feel like passing out.  You have trouble breathing, with or without belly pain.  You have a very bad headache and belly pain.  You have fluid leaking from your vagina and belly pain.  You keep having watery poop (diarrhea).  Your belly pain does not go away after resting, or the pain gets worse. MAKE SURE YOU:   Understand these instructions.  Will watch your condition.  Will get help right away if you are not doing well or get worse.   This information is not intended to replace advice given to you by your health care provider. Make sure you discuss any questions you have with your health care provider.     Document Released: 04/10/2009 Document Revised: 12/23/2012 Document Reviewed: 11/19/2012 Elsevier Interactive Patient Education 2016 [employer]  Morning Sickness Morning sickness is when you feel sick to your stomach (nauseous) during pregnancy. You may feel sick to your stomach and throw up (vomit). You may feel sick in the morning, but you can feel this way any time of day. Some women feel very sick to their stomach and cannot stop throwing up (hyperemesis gravidarum). HOME CARE  Only take medicines as told by your doctor.  Take multivitamins as told by your doctor. Taking multivitamins before getting pregnant can stop or lessen the harshness of morning sickness.  Eat dry toast or unsalted crackers before getting out of bed.  Eat 5 to 6 small meals a day.  Eat dry and bland foods like rice and baked potatoes.  Do not drink liquids with meals. Drink between meals.  Do not eat greasy, fatty, or spicy foods.  Have someone cook for you if the smell of food causes you to feel sick or throw up.  If you feel sick to your stomach after taking prenatal vitamins, take them at night or with a snack.  Eat protein when you need a snack (nuts, yogurt, cheese).  Eat unsweetened gelatins for dessert.  Wear a bracelet used for sea  sickness (acupressure wristband).  Go to a doctor that puts thin needles into certain body points (acupuncture) to improve how you feel.  Do not smoke.  Use a humidifier to keep the air in your house free of odors.  Get lots of fresh air. GET HELP IF:  You need medicine to feel better.  You feel dizzy or lightheaded.  You are losing weight. GET HELP RIGHT AWAY IF:   You feel very sick to your stomach and cannot stop throwing up.  You pass out (faint). MAKE SURE YOU:  Understand these instructions.  Will watch your condition.  Will get help right away if you are not doing well or get worse.   This information is not intended to replace advice  given to you by your health care provider. Make sure you discuss any questions you have with your health care provider.   Document Released: 05/30/2004 Document Revised: 05/13/2014 Document Reviewed: 10/07/2012 Elsevier Interactive Patient Education [employer].

## 2016-02-12 ENCOUNTER — Other Ambulatory Visit: Payer: Medicaid Other | Admitting: *Deleted

## 2016-02-12 DIAGNOSIS — O3680X0 Pregnancy with inconclusive fetal viability, not applicable or unspecified: Secondary | ICD-10-CM

## 2016-02-12 LAB — GC/CHLAMYDIA PROBE AMP (~~LOC~~) NOT AT ARMC
CHLAMYDIA, DNA PROBE: NEGATIVE
NEISSERIA GONORRHEA: NEGATIVE

## 2016-02-12 LAB — HCG, QUANTITATIVE, PREGNANCY: hCG, Beta Chain, Quant, S: 525 m[IU]/mL — ABNORMAL HIGH (ref <5)

## 2016-02-12 NOTE — Progress Notes (Signed)
Pt in for repeat hcg level. She has a pregnancy of unknown location. Last hcg level was on 02/09/16 137. Her ultrasound showed no baby. HCG today is 525. Pt scheduled for ultrasound on 10/23 at 10:45 am.

## 2016-02-23 ENCOUNTER — Other Ambulatory Visit: Payer: Medicaid Other

## 2016-02-26 ENCOUNTER — Ambulatory Visit (HOSPITAL_COMMUNITY)
Admission: RE | Admit: 2016-02-26 | Discharge: 2016-02-26 | Disposition: A | Discharge status: Home / Self Care | Payer: Medicaid Other | Source: Ambulatory Visit | Attending: Family Medicine | Admitting: Family Medicine

## 2016-02-26 ENCOUNTER — Encounter (HOSPITAL_COMMUNITY): Payer: Self-pay | Admitting: Emergency Medicine

## 2016-02-26 ENCOUNTER — Ambulatory Visit (HOSPITAL_COMMUNITY)
Admission: EM | Admit: 2016-02-26 | Discharge: 2016-02-26 | Disposition: A | Discharge status: Home / Self Care | Payer: Medicaid Other | Attending: Family Medicine | Admitting: Family Medicine

## 2016-02-26 ENCOUNTER — Ambulatory Visit: Payer: Medicaid Other

## 2016-02-26 DIAGNOSIS — O208 Other hemorrhage in early pregnancy: Secondary | ICD-10-CM | POA: Insufficient documentation

## 2016-02-26 DIAGNOSIS — Z3A01 Less than 8 weeks gestation of pregnancy: Secondary | ICD-10-CM | POA: Diagnosis not present

## 2016-02-26 DIAGNOSIS — Z362 Encounter for other antenatal screening follow-up: Secondary | ICD-10-CM | POA: Insufficient documentation

## 2016-02-26 DIAGNOSIS — E049 Nontoxic goiter, unspecified: Secondary | ICD-10-CM

## 2016-02-26 DIAGNOSIS — H6020 Malignant otitis externa, unspecified ear: Secondary | ICD-10-CM | POA: Diagnosis not present

## 2016-02-26 DIAGNOSIS — O3680X0 Pregnancy with inconclusive fetal viability, not applicable or unspecified: Secondary | ICD-10-CM

## 2016-02-26 MED ORDER — CIPROFLOXACIN-HYDROCORTISONE 0.2-1 % OT SUSP: 3.0000 [drp] | Freq: Two times a day (BID) | OTIC | 0 refills | Status: DC

## 2016-02-26 NOTE — ED Triage Notes (Signed)
Bilateral ear pain for 2 days.  Described as stuffy ears, painful ears.  Patient is pregnant.  lmp 8/23.

## 2016-02-26 NOTE — ED Provider Notes (Signed)
[place]    CSN: [id number] Arrival date & time: 02/26/16  1152     History   Chief Complaint Chief Complaint  Patient presents with  . Otalgia    HPI Debra Mills is a 30 y.o. female.   This is a 30 year old pregnant woman who comes in with bilateral ear discomfort for 2 days. She is originally from [place] and states that she has been pregnant for 2 weeks. She's had no fever, her balance is okay, and she has a slight decrease in her hearing.      Past Medical History:  Diagnosis Date  . Gestational diabetes     Patient Active Problem List   Diagnosis Date Noted  . H/O gestational diabetes mellitus, not currently pregnant 12/01/2014  . LGSIL (low grade squamous intraepithelial dysplasia) 08/11/2014    Past Surgical History:  Procedure Laterality Date  . CESAREAN SECTION N/A 09/28/2014   Procedure: CESAREAN SECTION;  Surgeon: Guss Bunde, MD;  Location: [facility];  Service: Obstetrics;  Laterality: N/A;    OB History    Gravida Para Term Preterm AB Living   2 1 1     1    SAB TAB Ectopic Multiple Live Births         0 1       Home Medications    Prior to Admission medications   Medication Sig Start Date End Date Taking? Authorizing Provider  acetaminophen (TYLENOL) 500 MG tablet Take 500 mg by mouth every 6 (six) hours as needed for moderate pain.    Historical Provider, MD  ciprofloxacin-hydrocortisone (CIPRO HC) otic suspension Place 3 drops into both ears 2 (two) times daily. 02/26/16   Robyn Haber, MD    Family History Family History  Problem Relation Age of Onset  . Diabetes Father   . Kidney disease Father     benign tumor removed 1 kidney  . Birth defects Brother     heart defect    Social History Social History  Substance Use Topics  . Smoking status: Never Smoker  . Smokeless tobacco: Never Used  . Alcohol use No     Allergies   Fish allergy   Review of Systems Review of Systems  Constitutional: Negative.     HENT: Positive for ear pain.   Eyes: Negative.   Respiratory: Negative.   Cardiovascular: Negative.      Physical Exam Triage Vital Signs ED Triage Vitals [02/26/16 1203]  Enc Vitals Group     BP 110/64     Pulse Rate 89     Resp 18     Temp 98.7 F (37.1 C)     Temp Source Oral     SpO2 99 %     Weight      Height      Head Circumference      Peak Flow      Pain Score      Pain Loc      Pain Edu?      Excl. in [place]?    No data found.   Updated Vital Signs BP 110/64 (BP Location: Right Arm)   Pulse 89   Temp 98.7 F (37.1 C) (Oral)   Resp 18   LMP 12/27/2015   SpO2 99%   Physical Exam  Constitutional: She is oriented to person, place, and time. She appears well-developed and well-nourished.  HENT:  Head: Normocephalic.  Both ear canals are very sensitive, the right being mildly swollen and reddened.  The TMs appear normal.  Eyes: Conjunctivae and EOM are normal. Pupils are equal, round, and reactive to light.  Neck: Normal range of motion. Neck supple. Thyromegaly present.  Cardiovascular: Normal rate.   Pulmonary/Chest: Effort normal and breath sounds normal.  Musculoskeletal: Normal range of motion.  Neurological: She is alert and oriented to person, place, and time.  Skin: Skin is warm and dry.  Psychiatric: She has a normal mood and affect. Her behavior is normal.  Nursing note and vitals reviewed.    UC Treatments / Results  Labs (all labs ordered are listed, but only abnormal results are displayed) Labs Reviewed - No data to display  EKG  EKG Interpretation None       Radiology No results found.  Procedures Procedures (including critical care time)  Medications Ordered in UC Medications - No data to display   Initial Impression / Assessment and Plan / UC Course  I have reviewed the triage vital signs and the nursing notes.  Pertinent labs & imaging results that were available during my care of the patient were reviewed by me and  considered in my medical decision making (see chart for details).  Clinical Course    Final Clinical Impressions(s) / UC Diagnoses   Final diagnoses:  Acute malignant otitis externa, unspecified laterality  Goiter    New Prescriptions New Prescriptions   CIPROFLOXACIN-HYDROCORTISONE (CIPRO HC) OTIC SUSPENSION    Place 3 drops into both ears 2 (two) times daily.     Robyn Haber, MD 02/26/16 1318

## 2016-03-26 ENCOUNTER — Inpatient Hospital Stay (HOSPITAL_COMMUNITY)
Admission: AD | Admit: 2016-03-26 | Discharge: 2016-03-26 | Disposition: A | Discharge status: Home / Self Care | Payer: Medicaid Other | Source: Ambulatory Visit | Attending: Obstetrics & Gynecology | Admitting: Obstetrics & Gynecology

## 2016-03-26 ENCOUNTER — Encounter (HOSPITAL_COMMUNITY): Payer: Self-pay | Admitting: *Deleted

## 2016-03-26 DIAGNOSIS — R1030 Lower abdominal pain, unspecified: Secondary | ICD-10-CM | POA: Diagnosis present

## 2016-03-26 DIAGNOSIS — O26891 Other specified pregnancy related conditions, first trimester: Secondary | ICD-10-CM | POA: Diagnosis not present

## 2016-03-26 DIAGNOSIS — N3001 Acute cystitis with hematuria: Secondary | ICD-10-CM | POA: Diagnosis not present

## 2016-03-26 DIAGNOSIS — Z3A10 10 weeks gestation of pregnancy: Secondary | ICD-10-CM | POA: Diagnosis not present

## 2016-03-26 DIAGNOSIS — O2341 Unspecified infection of urinary tract in pregnancy, first trimester: Secondary | ICD-10-CM | POA: Insufficient documentation

## 2016-03-26 HISTORY — DX: Nontoxic goiter, unspecified: E04.9

## 2016-03-26 LAB — WET PREP, GENITAL
Clue Cells Wet Prep HPF POC: NONE SEEN
SPERM: NONE SEEN
Trich, Wet Prep: NONE SEEN
Yeast Wet Prep HPF POC: NONE SEEN

## 2016-03-26 LAB — URINALYSIS, ROUTINE W REFLEX MICROSCOPIC
Bilirubin Urine: NEGATIVE
GLUCOSE, UA: NEGATIVE mg/dL
KETONES UR: NEGATIVE mg/dL
NITRITE: NEGATIVE
PH: 5.5 (ref 5.0–8.0)
Protein, ur: NEGATIVE mg/dL
SPECIFIC GRAVITY, URINE: 1.025 (ref 1.005–1.030)

## 2016-03-26 LAB — URINE MICROSCOPIC-ADD ON

## 2016-03-26 MED ORDER — CEPHALEXIN 500 MG PO CAPS: 500.0000 mg | ORAL_CAPSULE | Freq: Four times a day (QID) | ORAL | 0 refills | Status: DC

## 2016-03-26 NOTE — Discharge Instructions (Signed)
Pregnancy and Urinary Tract Infection °WHAT IS A URINARY TRACT INFECTION? °A urinary tract infection (UTI) is an infection of any part of the urinary tract. This includes the kidneys, the tubes that connect your kidneys to your bladder (ureters), the bladder, and the tube that carries urine out of your body (urethra). These organs make, store, and get rid of urine in the body. A UTI can be a bladder infection (cystitis) or a kidney infection (pyelonephritis). This infection may be caused by fungi, viruses, and bacteria. Bacteria are the most common cause of UTIs. °You are more likely to develop a UTI during pregnancy because: °· The physical and hormonal changes your body goes through can make it easier for bacteria to get into your urinary tract. °· Your growing baby puts pressure on your uterus and can affect urine flow. °DOES A UTI PLACE MY BABY AT RISK? °An untreated UTI during pregnancy could lead to a kidney infection, which can cause health problems that could affect your baby. Possible complications of an untreated UTI include: °· Having your baby before 37 weeks of pregnancy (premature). °· Having a baby with a low birth weight. °· Developing high blood pressure during pregnancy (preeclampsia). °WHAT ARE THE SYMPTOMS OF A UTI? °Symptoms of a UTI include: °· Fever. °· Frequent urination or passing small amounts of urine frequently. °· Needing to urinate urgently. °· Pain or a burning sensation with urination. °· Urine that smells bad or unusual. °· Cloudy urine. °· Pain in the lower abdomen or back. °· Trouble urinating. °· Blood in the urine. °· Vomiting or being less hungry than normal. °· Diarrhea or abdominal pain. °· Vaginal discharge. °WHAT ARE THE TREATMENT OPTIONS FOR A UTI DURING PREGNANCY? °Treatment for this condition may include: °· Antibiotic medicines that are safe to take during pregnancy. °· Other medicines to treat less common causes of UTI. °HOW CAN I PREVENT A UTI? °To prevent a UTI: °· Go  to the bathroom as soon as you feel the need. °· Always wipe from front to back. °· Wash your genital area with soap and warm water daily. °· Empty your bladder before and after sex. °· Wear cotton underwear. °· Limit your intake of high sugar foods or drinks, such as regular soda, juice, and sweets.. °· Drink 6-8 glasses of water daily. °· Do not wear tight-fitting pants. °· Do not douche or use deodorant sprays. °· Do not drink alcohol, caffeine, or carbonated drinks. These can irritate the bladder. °WHEN SHOULD I SEEK MEDICAL CARE? °Seek medical care if: °· Your symptoms do not improve or get worse. °· You have a fever after two days of treatment. °· You have a rash. °· You have abnormal vaginal discharge. °· You have back or side pain. °· You have chills. °· You have nausea and vomiting. °WHEN SHOULD I SEEK IMMEDIATE MEDICAL CARE? °Seek immediate medical care if you are pregnant and: °· You feel contractions in your uterus. °· You have lower belly pain. °· You have a gush of fluid from your vagina. °· You have blood in your urine. °· You are vomiting and cannot keep down any medicines or water. °This information is not intended to replace advice given to you by your health care provider. Make sure you discuss any questions you have with your health care provider. °Document Released: 08/17/2010 Document Revised: 09/25/2015 Document Reviewed: 03/13/2015 °Elsevier Interactive Patient Education © 2017 [employer] ° °

## 2016-03-26 NOTE — MAU Note (Signed)
Abdominal pain X 1 week, coming from inside "in the belly," like a knife. Comes and goes but more frequent today. No bleeding. Small white D/C, not concerning to pt.

## 2016-03-26 NOTE — MAU Provider Note (Signed)
History     CSN: [id number]  Arrival date and time: 03/26/16 1455   First Provider Initiated Contact with Patient 03/26/16 1541      Chief Complaint  Patient presents with  . Abdominal Pain   HPI [name] is a 30 y.o. G2P1001 at [redacted]w[redacted]d who presents to MAU today with complaint of lower abdominal pain. The patient states pain has been intermittent x 1 week, but constant today. She took Tylenol at 0600 with some relief, but none since. She denies abdominal pain, but has had a small amount of white discharge. She also endorses mild dysuria. She denies fever, N/V/D or constipation. She plans to start prenatal care with a practice on [place].   OB History    Gravida Para Term Preterm AB Living   2 1 1     1    SAB TAB Ectopic Multiple Live Births         0 1      Past Medical History:  Diagnosis Date  . Gestational diabetes   . Goiter     Past Surgical History:  Procedure Laterality Date  . CESAREAN SECTION N/A 09/28/2014   Procedure: CESAREAN SECTION;  Surgeon: Guss Bunde, MD;  Location: [facility];  Service: Obstetrics;  Laterality: N/A;    Family History  Problem Relation Age of Onset  . Diabetes Father   . Kidney disease Father     benign tumor removed 1 kidney  . Birth defects Brother     heart defect    Social History  Substance Use Topics  . Smoking status: Never Smoker  . Smokeless tobacco: Never Used  . Alcohol use No    Allergies:  Allergies  Allergen Reactions  . Fish Allergy Hives    No prescriptions prior to admission.    Review of Systems  Constitutional: Negative for fever and malaise/fatigue.  Gastrointestinal: Positive for abdominal pain. Negative for constipation, diarrhea, heartburn, nausea and vomiting.  Genitourinary: Positive for dysuria. Negative for frequency and urgency.       + vaginal discharge Neg - vaginal bleeding   Physical Exam   Blood pressure (!) 100/50, pulse 80, temperature 98.5 F (36.9 C), resp. rate 16,  height 5\' 2"  (1.575 m), weight 170 lb (77.1 kg), last menstrual period 12/27/2015, unknown if currently breastfeeding.  Physical Exam  Nursing note and vitals reviewed. Constitutional: She is oriented to person, place, and time. She appears well-developed and well-nourished. No distress.  HENT:  Head: Normocephalic and atraumatic.  Cardiovascular: Normal rate.   Respiratory: Effort normal.  GI: Soft. She exhibits no distension and no mass. There is no tenderness. There is no rebound and no guarding.  Genitourinary: Uterus is enlarged (slightly). Uterus is not tender. Cervix exhibits no motion tenderness. No bleeding in the vagina. Vaginal discharge (scant thin, white) found.  Neurological: She is alert and oriented to person, place, and time.  Skin: Skin is warm and dry. No erythema.  Psychiatric: She has a normal mood and affect.    Results for orders placed or performed during the hospital encounter of 03/26/16 (from the past 24 hour(s))  Urinalysis, Routine w reflex microscopic (not at [facility])     Status: Abnormal   Collection Time: 03/26/16  3:19 PM  Result Value Ref Range   Color, Urine YELLOW YELLOW   APPearance CLOUDY (A) CLEAR   Specific Gravity, Urine 1.025 1.005 - 1.030   pH 5.5 5.0 - 8.0   Glucose, UA NEGATIVE NEGATIVE mg/dL  Hgb urine dipstick SMALL (A) NEGATIVE   Bilirubin Urine NEGATIVE NEGATIVE   Ketones, ur NEGATIVE NEGATIVE mg/dL   Protein, ur NEGATIVE NEGATIVE mg/dL   Nitrite NEGATIVE NEGATIVE   Leukocytes, UA LARGE (A) NEGATIVE  Urine microscopic-add on     Status: Abnormal   Collection Time: 03/26/16  3:19 PM  Result Value Ref Range   Squamous Epithelial / LPF 6-30 (A) NONE SEEN   WBC, UA TOO NUMEROUS TO COUNT 0 - 5 WBC/hpf   RBC / HPF 0-5 0 - 5 RBC/hpf   Bacteria, UA FEW (A) NONE SEEN  Wet prep, genital     Status: Abnormal   Collection Time: 03/26/16  3:46 PM  Result Value Ref Range   Yeast Wet Prep HPF POC NONE SEEN NONE SEEN   Trich, Wet Prep NONE SEEN  NONE SEEN   Clue Cells Wet Prep HPF POC NONE SEEN NONE SEEN   WBC, Wet Prep HPF POC MODERATE (A) NONE SEEN   Sperm NONE SEEN     MAU Course  Procedures None  MDM UA and wet prep today  Urine culture ordered  Assessment and Plan  A: SIUP at [redacted]w[redacted]d UTI  P: Discharge home Rx for Keflex sent to patient's pharmacy  First trimeser precautions discussed Patient advised to follow-up with [facility] provider of choice as scheduled to start prenatal care Patient may return to MAU as needed or if her condition were to change or worsen  Luvenia Redden, PA-C  03/26/2016, 4:20 PM

## 2016-03-28 LAB — CULTURE, OB URINE

## 2016-06-04 ENCOUNTER — Encounter (HOSPITAL_COMMUNITY): Payer: Self-pay | Admitting: Emergency Medicine

## 2016-06-04 ENCOUNTER — Emergency Department (HOSPITAL_COMMUNITY): Payer: Medicaid Other

## 2016-06-04 ENCOUNTER — Emergency Department (HOSPITAL_COMMUNITY)
Admission: EM | Admit: 2016-06-04 | Discharge: 2016-06-05 | Disposition: A | Discharge status: Home / Self Care | Payer: Medicaid Other | Attending: Emergency Medicine | Admitting: Emergency Medicine

## 2016-06-04 DIAGNOSIS — E049 Nontoxic goiter, unspecified: Secondary | ICD-10-CM | POA: Insufficient documentation

## 2016-06-04 DIAGNOSIS — R221 Localized swelling, mass and lump, neck: Secondary | ICD-10-CM | POA: Diagnosis present

## 2016-06-04 LAB — BASIC METABOLIC PANEL
ANION GAP: 7 (ref 5–15)
BUN: 8 mg/dL (ref 6–20)
CALCIUM: 9 mg/dL (ref 8.9–10.3)
CO2: 23 mmol/L (ref 22–32)
Chloride: 104 mmol/L (ref 101–111)
Creatinine, Ser: 0.46 mg/dL (ref 0.44–1.00)
GFR calc Af Amer: >60 mL/min (ref >60)
GFR calc non Af Amer: >60 mL/min (ref >60)
GLUCOSE: 111 mg/dL — AB (ref 65–99)
Potassium: 3.4 mmol/L — ABNORMAL LOW (ref 3.5–5.1)
Sodium: 134 mmol/L — ABNORMAL LOW (ref 135–145)

## 2016-06-04 LAB — CBC WITH DIFFERENTIAL/PLATELET
BASOS PCT: 0 %
Basophils Absolute: 0 10*3/uL (ref 0.0–0.1)
Eosinophils Absolute: 0.1 10*3/uL (ref 0.0–0.7)
Eosinophils Relative: 1 %
HEMATOCRIT: 32.9 % — AB (ref 36.0–46.0)
Hemoglobin: 11.4 g/dL — ABNORMAL LOW (ref 12.0–15.0)
Lymphocytes Relative: 18 %
Lymphs Abs: 3.1 10*3/uL (ref 0.7–4.0)
MCH: 27.6 pg (ref 26.0–34.0)
MCHC: 34.7 g/dL (ref 30.0–36.0)
MCV: 79.7 fL (ref 78.0–100.0)
MONO ABS: 1.2 10*3/uL — AB (ref 0.1–1.0)
MONOS PCT: 7 %
NEUTROS ABS: 13 10*3/uL — AB (ref 1.7–7.7)
Neutrophils Relative %: 74 %
Platelets: 187 10*3/uL (ref 150–400)
RBC: 4.13 MIL/uL (ref 3.87–5.11)
RDW: 12.9 % (ref 11.5–15.5)
WBC: 17.4 10*3/uL — ABNORMAL HIGH (ref 4.0–10.5)

## 2016-06-04 LAB — TSH: TSH: 1.199 u[IU]/mL (ref 0.350–4.500)

## 2016-06-04 NOTE — ED Notes (Signed)
US at bedside

## 2016-06-04 NOTE — ED Provider Notes (Signed)
[facility] DEPT Provider Note   CSN: [id number] Arrival date & time: 06/04/16  1943     History   Chief Complaint Chief Complaint  Patient presents with  . Sore Throat    HPI Debra Mills is a 31 y.o. female who presents with worsening neck swelling. PMH significant for goiter. It was first noted in Oct when she went to [facility]. She has not followed up with any provider about this. She is originally from [place]. She states her father has had similar symptoms as well and had his goiter removed. She states that she has had worsening swelling over the past month and has some difficulty breathing. She reports intermittent subjective fevers. Denies chills, headache, ear pain, sore throat, inability to swallow, drooling, cough, chest pain, abdominal pain, nausea, vomiting, vaginal bleeding. She is [redacted] weeks pregnant and has a appointment coming up in February for her first visit.  HPI  Past Medical History:  Diagnosis Date  . Gestational diabetes   . Goiter     Patient Active Problem List   Diagnosis Date Noted  . H/O gestational diabetes mellitus, not currently pregnant 12/01/2014  . LGSIL (low grade squamous intraepithelial dysplasia) 08/11/2014    Past Surgical History:  Procedure Laterality Date  . CESAREAN SECTION N/A 09/28/2014   Procedure: CESAREAN SECTION;  Surgeon: Guss Bunde, MD;  Location: [facility];  Service: Obstetrics;  Laterality: N/A;    OB History    Gravida Para Term Preterm AB Living   2 1 1     1    SAB TAB Ectopic Multiple Live Births         0 1       Home Medications    Prior to Admission medications   Medication Sig Start Date End Date Taking? Authorizing Provider  acetaminophen (TYLENOL) 500 MG tablet Take 500 mg by mouth every 6 (six) hours as needed for moderate pain.    Historical Provider, MD  cephALEXin (KEFLEX) 500 MG capsule Take 1 capsule (500 mg total) by mouth 4 (four) times daily. 03/26/16   Luvenia Redden, PA-C  Prenatal Vit-Fe Fumarate-FA  (PRENATAL MULTIVITAMIN) TABS tablet Take 1 tablet by mouth daily at 12 noon.    Historical Provider, MD  promethazine (PHENERGAN) 25 MG tablet Take 25 mg by mouth every 6 (six) hours as needed for nausea or vomiting.  02/12/16   Historical Provider, MD    Family History Family History  Problem Relation Age of Onset  . Diabetes Father   . Kidney disease Father     benign tumor removed 1 kidney  . Cancer Father   . Thyroid disease Father   . Birth defects Brother     heart defect    Social History Social History  Substance Use Topics  . Smoking status: Never Smoker  . Smokeless tobacco: Never Used  . Alcohol use No     Allergies   Fish allergy   Review of Systems Review of Systems  Constitutional: Positive for fever. Negative for chills.  HENT: Negative for congestion, ear pain, rhinorrhea and sore throat.   Respiratory: Positive for shortness of breath. Negative for cough.   Cardiovascular: Negative for chest pain.  Gastrointestinal: Negative for abdominal pain, nausea and vomiting.  Genitourinary: Negative for pelvic pain and vaginal bleeding.  Musculoskeletal: Positive for neck pain (and swelling).  All other systems reviewed and are negative.    Physical Exam Updated Vital Signs BP 116/75 (BP Location: Left Arm)   Pulse 89  Temp 97.7 F (36.5 C) (Oral)   Resp 18   Ht 5\' 1"  (1.549 m)   Wt 75.8 kg   LMP 12/27/2015   SpO2 99%   BMI 31.58 kg/m   Physical Exam  Constitutional: She is oriented to person, place, and time. She appears well-developed and well-nourished. No distress.  Calm, NAD, Pregnant  HENT:  Head: Normocephalic and atraumatic.  Right Ear: Hearing, tympanic membrane, external ear and ear canal normal.  Left Ear: Hearing, tympanic membrane, external ear and ear canal normal.  Nose: Nose normal.  Mouth/Throat: Uvula is midline, oropharynx is clear and moist and mucous membranes are normal.  Eyes: Conjunctivae are normal. Pupils are equal,  round, and reactive to light. Right eye exhibits no discharge. Left eye exhibits no discharge. No scleral icterus.  Neck: Normal range of motion. Thyromegaly (Goiter is tender to palpation) present.  Cardiovascular: Normal rate and regular rhythm.  Exam reveals no gallop and no friction rub.   No murmur heard. Pulmonary/Chest: Effort normal and breath sounds normal. No respiratory distress. She has no wheezes. She has no rales. She exhibits no tenderness.  Abdominal: She exhibits no distension.  Neurological: She is alert and oriented to person, place, and time.  Skin: Skin is warm and dry.  Psychiatric: She has a normal mood and affect. Her behavior is normal.  Nursing note and vitals reviewed.    ED Treatments / Results  Labs (all labs ordered are listed, but only abnormal results are displayed) Labs Reviewed  BASIC METABOLIC PANEL - Abnormal; Notable for the following:       Result Value   Sodium 134 (*)    Potassium 3.4 (*)    Glucose, Bld 111 (*)    All other components within normal limits  CBC WITH DIFFERENTIAL/PLATELET - Abnormal; Notable for the following:    WBC 17.4 (*)    Hemoglobin 11.4 (*)    HCT 32.9 (*)    Neutro Abs 13.0 (*)    Monocytes Absolute 1.2 (*)    All other components within normal limits  TSH  T4, FREE  T3, FREE    EKG  EKG Interpretation None       Radiology US Thyroid  Result Date: 06/05/2016 CLINICAL DATA:  Shortness of breath.  Goiter on physical exam. EXAM: THYROID ULTRASOUND TECHNIQUE: Ultrasound examination of the thyroid gland and adjacent soft tissues was performed. COMPARISON:  None. FINDINGS: Parenchymal Echotexture: Mildly heterogenous Isthmus: 0.9 cm thickness Right lobe: 3.5 x 1.4 x 1.4 cm Left lobe: 9 x 4 x 5.9 cm _________________________________________________________ Estimated total number of nodules >/= 1 cm: 1 Number of spongiform nodules >/=  2 cm not described below (TR1): 0 Number of mixed cystic and solid nodules >/=  1.5 cm not described below (TR2): 0 Nodule # 1: Location: Left; Mid Maximum size: 7.4 cm Cm; Other 2 dimensions: 7.2 x 4.6 cm Composition: mixed cystic and solid (1) Echogenicity: isoechoic (1) Shape: not taller-than-wide (0) Margins: ill-defined (0) Echogenic foci: macrocalcifications (1) ACR TI-RADS total points: 3. ACR TI-RADS risk category: TR3 (3 points). ACR TI-RADS recommendations: **Given size (>/= 2.5 cm) and appearance, fine needle aspiration of this mildly suspicious nodule should be considered based on TI-RADS criteria. IMPRESSION: 1. 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 Recommend FNA biopsy. The above is in keeping with the ACR TI-RADS recommendations - J Am Coll Radiol 2017;14:587-595. Electronically Signed   By: Lucrezia Europe M.D.   On: 06/05/2016 07:49    Procedures  Procedures (including critical care time)  Medications Ordered in ED Medications - No data to display   Initial Impression / Assessment and Plan / ED Course  I have reviewed the triage vital signs and the nursing notes.  Pertinent labs & imaging results that were available during my care of the patient were reviewed by me and considered in my medical decision making (see chart for details).  31 year old female presents with a goiter. Patient is afebrile, not tachycardic or tachypneic, normotensive, and not hypoxic. She had one reading of tachycardia (was 129). Do not feel this is accurate since all her other readings were 80-90s. CBC remarkable for leukocytosis of 17.4 and mild anemia. Likely due to pregnancy. She is afebrile. BMP remarkable for mild hyponatremia, mild hypokalemia. TSH is normal T4 is normal. Thyroid US is pending. Will be read in the morning. ENT follow up given.   Update: Called pt back regarding [place] results. Encouraged her to call ENT to make appt. She verbalized understanding.  Final Clinical Impressions(s) / ED Diagnoses   Final diagnoses:  Goiter    New Prescriptions Discharge  Medication List as of 06/05/2016  1:21 AM       Recardo Evangelist, PA-C 06/05/16 2114    Nat Christen, MD 06/07/16 1346

## 2016-06-04 NOTE — ED Triage Notes (Signed)
Pt is c/o sore throat and states she feels like it is hard to breathe  No resp distress  Speaking in complete sentences  Pt states sxs started a week ago  Pt states she is [redacted] weeks pregnant  States has not had any prenatal care but has a dr appt on Feb 12th   Pt denies any spotting, bleeding, cramping or back pain  Pt states she has a headache also

## 2016-06-05 LAB — T4, FREE: FREE T4: 0.68 ng/dL (ref 0.61–1.12)

## 2016-06-05 NOTE — Discharge Instructions (Signed)
Please call endocrinology to follow up I will call you tomorrow with results of your ultrasound

## 2016-06-06 LAB — T3, FREE: T3, Free: 3.2 pg/mL (ref 2.0–4.4)

## 2016-06-21 ENCOUNTER — Encounter (HOSPITAL_COMMUNITY): Payer: Self-pay

## 2016-06-21 ENCOUNTER — Inpatient Hospital Stay (HOSPITAL_COMMUNITY)
Admission: AD | Admit: 2016-06-21 | Discharge: 2016-06-21 | Disposition: A | Discharge status: Home / Self Care | Payer: Medicaid Other | Source: Ambulatory Visit | Attending: Family Medicine | Admitting: Family Medicine

## 2016-06-21 DIAGNOSIS — O26892 Other specified pregnancy related conditions, second trimester: Secondary | ICD-10-CM | POA: Diagnosis not present

## 2016-06-21 DIAGNOSIS — Z833 Family history of diabetes mellitus: Secondary | ICD-10-CM | POA: Insufficient documentation

## 2016-06-21 DIAGNOSIS — Z841 Family history of disorders of kidney and ureter: Secondary | ICD-10-CM | POA: Diagnosis not present

## 2016-06-21 DIAGNOSIS — B009 Herpesviral infection, unspecified: Secondary | ICD-10-CM | POA: Diagnosis not present

## 2016-06-21 DIAGNOSIS — Z91013 Allergy to seafood: Secondary | ICD-10-CM | POA: Diagnosis not present

## 2016-06-21 DIAGNOSIS — Z809 Family history of malignant neoplasm, unspecified: Secondary | ICD-10-CM | POA: Insufficient documentation

## 2016-06-21 DIAGNOSIS — R102 Pelvic and perineal pain: Secondary | ICD-10-CM | POA: Insufficient documentation

## 2016-06-21 DIAGNOSIS — R109 Unspecified abdominal pain: Secondary | ICD-10-CM | POA: Diagnosis not present

## 2016-06-21 DIAGNOSIS — O24419 Gestational diabetes mellitus in pregnancy, unspecified control: Secondary | ICD-10-CM | POA: Diagnosis not present

## 2016-06-21 DIAGNOSIS — E049 Nontoxic goiter, unspecified: Secondary | ICD-10-CM | POA: Diagnosis not present

## 2016-06-21 LAB — URINALYSIS, ROUTINE W REFLEX MICROSCOPIC
Bilirubin Urine: NEGATIVE
Glucose, UA: NEGATIVE mg/dL
HGB URINE DIPSTICK: NEGATIVE
Ketones, ur: NEGATIVE mg/dL
Nitrite: NEGATIVE
Protein, ur: NEGATIVE mg/dL
SPECIFIC GRAVITY, URINE: 1.013 (ref 1.005–1.030)
pH: 8 (ref 5.0–8.0)

## 2016-06-21 MED ORDER — VALACYCLOVIR HCL 1 G PO TABS
1000.0000 mg | ORAL_TABLET | Freq: Two times a day (BID) | ORAL | 0 refills | Status: DC
Start: 2016-06-21 — End: 2016-07-17

## 2016-06-21 NOTE — MAU Note (Addendum)
Pt c/o pain at her umibilicus that comes and goes. Pt states sometimes it pouches out. Pt also c/o a sore on her bottom that came up yesterday that is itchy and burns. Pt states baby is moving normally. Pt denies bleeding and leaking of fluid.

## 2016-06-21 NOTE — MAU Provider Note (Signed)
History     CSN: [id number]  Arrival date and time: 06/21/16 1736   First Provider Initiated Contact with Patient 06/21/16 1844      No chief complaint on file.  HPI [name] is a 31 y.o. G2P1001 at [redacted]w[redacted]d who presents to MAU today with complaint of abdominal pain and an inguinal sore. The patient states that she has noted that her mid abdomen "pouches" out when she sits up or changes position. She states that she had a similar issue in her previous pregnancy with an abdominal wall defect and was told there was nothing to be done. She has tried Tylenol for pain and denies pain currently. She denies vaginal bleeding, LOF or complications with the pregnancy. She also states that she has a "sore" in the inguinal region on her right side that has been present since yesterday. She denies new sexual partners. She reports normal fetal movement.   OB History    Gravida Para Term Preterm AB Living   2 1 1     1    SAB TAB Ectopic Multiple Live Births         0 1      Past Medical History:  Diagnosis Date  . Gestational diabetes   . Goiter     Past Surgical History:  Procedure Laterality Date  . CESAREAN SECTION N/A 09/28/2014   Procedure: CESAREAN SECTION;  Surgeon: Guss Bunde, MD;  Location: [facility];  Service: Obstetrics;  Laterality: N/A;    Family History  Problem Relation Age of Onset  . Diabetes Father   . Kidney disease Father     benign tumor removed 1 kidney  . Cancer Father   . Thyroid disease Father   . Birth defects Brother     heart defect    Social History  Substance Use Topics  . Smoking status: Never Smoker  . Smokeless tobacco: Never Used  . Alcohol use No    Allergies:  Allergies  Allergen Reactions  . Fish Allergy Hives    Prescriptions Prior to Admission  Medication Sig Dispense Refill Last Dose  . acetaminophen (TYLENOL) 500 MG tablet Take 500 mg by mouth every 6 (six) hours as needed for moderate pain.   06/20/2016 at Unknown time  .  folic acid (FOLVITE) 1 MG tablet Take 1 mg by mouth daily.   06/21/2016 at Unknown time    Review of Systems  Constitutional: Negative for fever.  Gastrointestinal: Positive for abdominal pain. Negative for constipation, diarrhea, nausea and vomiting.  Genitourinary: Positive for vaginal pain. Negative for dysuria, frequency, urgency, vaginal bleeding and vaginal discharge.   Physical Exam   Blood pressure 114/63, pulse 102, temperature 98.3 F (36.8 C), temperature source Oral, resp. rate 16, height 5' (1.524 m), weight 170 lb (77.1 kg), last menstrual period 12/27/2015, SpO2 99 %, unknown if currently breastfeeding.  Physical Exam  Nursing note and vitals reviewed. Constitutional: She is oriented to person, place, and time. She appears well-developed and well-nourished. No distress.  HENT:  Head: Normocephalic and atraumatic.  Cardiovascular: Normal rate.   Respiratory: Effort normal.  GI: Soft. She exhibits no distension and no mass. There is no tenderness. There is no rebound and no guarding.  With change of positions there is a small abdominal wall outpouching noted. No evidence of hernia. No erythema, edema or tenderness to palpation  Genitourinary:     Neurological: She is alert and oriented to person, place, and time.  Skin: Skin is warm  and dry. No erythema.  Psychiatric: She has a normal mood and affect.    Results for orders placed or performed during the hospital encounter of 06/21/16 (from the past 24 hour(s))  Urinalysis, Routine w reflex microscopic     Status: Abnormal   Collection Time: 06/21/16  5:51 PM  Result Value Ref Range   Color, Urine YELLOW YELLOW   APPearance CLOUDY (A) CLEAR   Specific Gravity, Urine 1.013 1.005 - 1.030   pH 8.0 5.0 - 8.0   Glucose, UA NEGATIVE NEGATIVE mg/dL   Hgb urine dipstick NEGATIVE NEGATIVE   Bilirubin Urine NEGATIVE NEGATIVE   Ketones, ur NEGATIVE NEGATIVE mg/dL   Protein, ur NEGATIVE NEGATIVE mg/dL   Nitrite NEGATIVE  NEGATIVE   Leukocytes, UA LARGE (A) NEGATIVE   RBC / HPF 0-5 0 - 5 RBC/hpf   WBC, UA TOO NUMEROUS TO COUNT 0 - 5 WBC/hpf   Bacteria, UA MANY (A) NONE SEEN   Squamous Epithelial / LPF 6-30 (A) NONE SEEN   Mucous PRESENT    Non Squamous Epithelial 0-5 (A) NONE SEEN    MAU Course  Procedures None  MDM FHR - 135 bpm with doppler UA today shows evidence of UTI Urine culture ordered HSV culture obtained.  Area of concern appears more like Herpes Zoster than HSV but culture obtained and medication prescribed.  Assessment and Plan  A: SIUP at [redacted]w[redacted]d HSV vs Zoster Abdominal wall defect  P: Discharge home Rx for Valtrex given to patient  Second trimester precautions discussed Patient advised to follow-up with [facility] for routine prenatal care as planned Patient may return to MAU as needed or if her condition were to change or worsen  Luvenia Redden, PA-C  06/21/2016, 6:44 PM

## 2016-06-21 NOTE — Discharge Instructions (Signed)
Abdominal Pain During Pregnancy °Belly (abdominal) pain is common during pregnancy. Most of the time, it is not a serious problem. Other times, it can be a sign that something is wrong with the pregnancy. Always tell your doctor if you have belly pain. °Follow these instructions at home: °Monitor your belly pain for any changes. The following actions may help you feel better: °· Do not have sex (intercourse) or put anything in your vagina until you feel better. °· Rest until your pain stops. °· Drink clear fluids if you feel sick to your stomach (nauseous). Do not eat solid food until you feel better. °· Only take medicine as told by your doctor. °· Keep all doctor visits as told. °Get help right away if: °· You are bleeding, leaking fluid, or pieces of tissue come out of your vagina. °· You have more pain or cramping. °· You keep throwing up (vomiting). °· You have pain when you pee (urinate) or have blood in your pee. °· You have a fever. °· You do not feel your baby moving as much. °· You feel very weak or feel like passing out. °· You have trouble breathing, with or without belly pain. °· You have a very bad headache and belly pain. °· You have fluid leaking from your vagina and belly pain. °· You keep having watery poop (diarrhea). °· Your belly pain does not go away after resting, or the pain gets worse. °This information is not intended to replace advice given to you by your health care provider. Make sure you discuss any questions you have with your health care provider. °Document Released: 04/10/2009 Document Revised: 11/29/2015 Document Reviewed: 11/19/2012 °Elsevier Interactive Patient Education © 2017 [employer] ° °

## 2016-06-24 LAB — HERPES SIMPLEX VIRUS(HSV) DNA BY PCR
HSV 1 DNA: NEGATIVE
HSV 2 DNA: POSITIVE — AB

## 2016-06-24 LAB — CULTURE, OB URINE: Culture: 50000 — AB

## 2016-06-24 NOTE — Progress Notes (Signed)
CRITICAL VALUE ALERT  Critical value received: HSV 2+    Date of notification:  06/24/16  Time of notification:  1500  Critical value read back:Yes.    Nurse who received alert:  Cwhite,rnc  Notified virginia smith,cnm@[id number] 

## 2016-06-25 ENCOUNTER — Telehealth: Payer: Self-pay | Admitting: Medical

## 2016-06-25 DIAGNOSIS — N390 Urinary tract infection, site not specified: Principal | ICD-10-CM

## 2016-06-25 DIAGNOSIS — B952 Enterococcus as the cause of diseases classified elsewhere: Secondary | ICD-10-CM

## 2016-06-25 MED ORDER — CEPHALEXIN 500 MG PO CAPS: 500.0000 mg | ORAL_CAPSULE | Freq: Four times a day (QID) | ORAL | 0 refills | Status: DC

## 2016-06-25 NOTE — Telephone Encounter (Signed)
LM for patient to return call to MAU. Has +HSV 2 (already treated) and UTI (Rx for Keflex sent today). Patient needs to be informed of both dx and new Rx for UTI.   Luvenia Redden, PA-C 06/25/2016 11:21 AM

## 2016-06-25 NOTE — Telephone Encounter (Signed)
Patient returned call to MAU. Advised of recent dx and Rx. Patient voiced understanding.   Luvenia Redden, PA-C 06/25/2016 3:30 PM

## 2016-06-26 ENCOUNTER — Other Ambulatory Visit (HOSPITAL_COMMUNITY)
Admission: RE | Admit: 2016-06-26 | Discharge: 2016-06-26 | Disposition: A | Discharge status: Home / Self Care | Payer: Medicaid Other | Source: Ambulatory Visit | Attending: Obstetrics and Gynecology | Admitting: Obstetrics and Gynecology

## 2016-06-26 ENCOUNTER — Ambulatory Visit (INDEPENDENT_AMBULATORY_CARE_PROVIDER_SITE_OTHER): Payer: Medicaid Other | Admitting: Obstetrics and Gynecology

## 2016-06-26 VITALS — BP 112/62 | HR 107 | Wt 180.0 lb

## 2016-06-26 DIAGNOSIS — O34219 Maternal care for unspecified type scar from previous cesarean delivery: Secondary | ICD-10-CM | POA: Insufficient documentation

## 2016-06-26 DIAGNOSIS — Z3480 Encounter for supervision of other normal pregnancy, unspecified trimester: Secondary | ICD-10-CM

## 2016-06-26 DIAGNOSIS — Z8632 Personal history of gestational diabetes: Secondary | ICD-10-CM | POA: Diagnosis not present

## 2016-06-26 DIAGNOSIS — O0993 Supervision of high risk pregnancy, unspecified, third trimester: Secondary | ICD-10-CM | POA: Insufficient documentation

## 2016-06-26 DIAGNOSIS — O0930 Supervision of pregnancy with insufficient antenatal care, unspecified trimester: Secondary | ICD-10-CM | POA: Insufficient documentation

## 2016-06-26 DIAGNOSIS — Z113 Encounter for screening for infections with a predominantly sexual mode of transmission: Secondary | ICD-10-CM | POA: Insufficient documentation

## 2016-06-26 DIAGNOSIS — O0932 Supervision of pregnancy with insufficient antenatal care, second trimester: Secondary | ICD-10-CM

## 2016-06-26 NOTE — Progress Notes (Signed)
Pt did not complete medicaid home.

## 2016-06-26 NOTE — Progress Notes (Signed)
  Subjective:    Debra Mills is a G2P1001 [redacted]w[redacted]d by LMP being seen today for her first obstetrical visit.  Her obstetrical history is significant for h/o GDM, previous cesarean section and late onset to care at 23 weeks. Patient does intend to breast feed. Pregnancy history fully reviewed.  Patient reports no complaints.  Vitals:   06/26/16 1257  BP: 112/62  Pulse: (!) 107  Weight: 180 lb (81.6 kg)    HISTORY: OB History  Gravida Para Term Preterm AB Living  2 1 1     1   SAB TAB Ectopic Multiple Live Births        0 1    # Outcome Date GA Lbr Len/2nd Weight Sex Delivery Anes PTL Lv  2 Current           1 Term 09/28/14 [redacted]w[redacted]d 19:14 / 06:22 8 lb 1.8 oz (3.68 kg) M CS-Classical EPI  LIV     Past Medical History:  Diagnosis Date  . Gestational diabetes   . Goiter    Past Surgical History:  Procedure Laterality Date  . CESAREAN SECTION N/A 09/28/2014   Procedure: CESAREAN SECTION;  Surgeon: Guss Bunde, MD;  Location: [facility];  Service: Obstetrics;  Laterality: N/A;   Family History  Problem Relation Age of Onset  . Diabetes Father   . Kidney disease Father     benign tumor removed 1 kidney  . Cancer Father   . Thyroid disease Father   . Birth defects Brother     heart defect     Exam    Uterus:  Fundal Height: 26 cm  Pelvic Exam:    Perineum: No Hemorrhoids, Normal Perineum   Vulva: normal   Vagina:  normal mucosa, normal discharge   pH:    Cervix: multiparous appearance   Adnexa: normal adnexa and no mass, fullness, tenderness   Bony Pelvis: gynecoid  System: Breast:  normal appearance, no masses or tenderness   Skin: normal coloration and turgor, no rashes    Neurologic: oriented, no focal deficits   Extremities: normal strength, tone, and muscle mass   HEENT extra ocular movement intact   Mouth/Teeth mucous membranes moist, pharynx normal without lesions and dental hygiene good   Neck supple and no masses   Cardiovascular: regular rate and rhythm   Respiratory:  chest clear, no wheezing, crepitations, rhonchi, normal symmetric air entry   Abdomen: soft, gravid, NT   Urinary:       Assessment:    Pregnancy: G2P1001 Patient Active Problem List   Diagnosis Date Noted  . Supervision of normal pregnancy, antepartum 06/26/2016  . H/O gestational diabetes mellitus, not currently pregnant 12/01/2014        Plan:     Initial labs drawn. Prenatal vitamins. Problem list reviewed and updated. Genetic Screening discussed : too late.  Ultrasound discussed; fetal survey: ordered. Patient declined flu vaccine  Third trimester labs and glucola next visit  Follow up in 3 weeks. 50% of 30 min visit spent on counseling and coordination of care.     Reshard Guillet 06/26/2016

## 2016-06-26 NOTE — Patient Instructions (Signed)
Second Trimester of Pregnancy The second trimester is from week 13 through week 28 (months 4 through 6). The second trimester is often a time when you feel your best. Your body has also adjusted to being pregnant, and you begin to feel better physically. Usually, morning sickness has lessened or quit completely, you may have more energy, and you may have an increase in appetite. The second trimester is also a time when the fetus is growing rapidly. At the end of the sixth month, the fetus is about 9 inches long and weighs about 1 pounds. You will likely begin to feel the baby move (quickening) between 18 and 20 weeks of the pregnancy. Body changes during your second trimester Your body continues to go through many changes during your second trimester. The changes vary from woman to woman.  Your weight will continue to increase. You will notice your lower abdomen bulging out.  You may begin to get stretch marks on your hips, abdomen, and breasts.  You may develop headaches that can be relieved by medicines. The medicines should be approved by your health care provider.  You may urinate more often because the fetus is pressing on your bladder.  You may develop or continue to have heartburn as a result of your pregnancy.  You may develop constipation because certain hormones are causing the muscles that push waste through your intestines to slow down.  You may develop hemorrhoids or swollen, bulging veins (varicose veins).  You may have back pain. This is caused by:  Weight gain.  Pregnancy hormones that are relaxing the joints in your pelvis.  A shift in weight and the muscles that support your balance.  Your breasts will continue to grow and they will continue to become tender.  Your gums may bleed and may be sensitive to brushing and flossing.  Dark spots or blotches (chloasma, mask of pregnancy) may develop on your face. This will likely fade after the baby is born.  A dark line  from your belly button to the pubic area (linea nigra) may appear. This will likely fade after the baby is born.  You may have changes in your hair. These can include thickening of your hair, rapid growth, and changes in texture. Some women also have hair loss during or after pregnancy, or hair that feels dry or thin. Your hair will most likely return to normal after your baby is born. What to expect at prenatal visits During a routine prenatal visit:  You will be weighed to make sure you and the fetus are growing normally.  Your blood pressure will be taken.  Your abdomen will be measured to track your baby's growth.  The fetal heartbeat will be listened to.  Any test results from the previous visit will be discussed. Your health care provider may ask you:  How you are feeling.  If you are feeling the baby move.  If you have had any abnormal symptoms, such as leaking fluid, bleeding, severe headaches, or abdominal cramping.  If you are using any tobacco products, including cigarettes, chewing tobacco, and electronic cigarettes.  If you have any questions. Other tests that may be performed during your second trimester include:  Blood tests that check for:  Low iron levels (anemia).  Gestational diabetes (between 24 and 28 weeks).  Rh antibodies. This is to check for a protein on red blood cells (Rh factor).  Urine tests to check for infections, diabetes, or protein in the urine.  An ultrasound to  confirm the proper growth and development of the baby.  An amniocentesis to check for possible genetic problems.  Fetal screens for spina bifida and Down syndrome.  HIV (human immunodeficiency virus) testing. Routine prenatal testing includes screening for HIV, unless you choose not to have this test. Follow these instructions at home: Eating and drinking  Continue to eat regular, healthy meals.  Avoid raw meat, uncooked cheese, cat litter boxes, and soil used by cats. These  carry germs that can cause birth defects in the baby.  Take your prenatal vitamins.  Take 1500-2000 mg of calcium daily starting at the 20th week of pregnancy until you deliver your baby.  If you develop constipation:  Take over-the-counter or prescription medicines.  Drink enough fluid to keep your urine clear or pale yellow.  Eat foods that are high in fiber, such as fresh fruits and vegetables, whole grains, and beans.  Limit foods that are high in fat and processed sugars, such as fried and sweet foods. Activity  Exercise only as directed by your health care provider. Experiencing uterine cramps is a good sign to stop exercising.  Avoid heavy lifting, wear low heel shoes, and practice good posture.  Wear your seat belt at all times when driving.  Rest with your legs elevated if you have leg cramps or low back pain.  Wear a good support bra for breast tenderness.  Do not use hot tubs, steam rooms, or saunas. Lifestyle  Avoid all smoking, herbs, alcohol, and unprescribed drugs. These chemicals affect the formation and growth of the baby.  Do not use any products that contain nicotine or tobacco, such as cigarettes and e-cigarettes. If you need help quitting, ask your health care provider.  A sexual relationship may be continued unless your health care provider directs you otherwise. General instructions  Follow your health care provider's instructions regarding medicine use. There are medicines that are either safe or unsafe to take during pregnancy.  Take warm sitz baths to soothe any pain or discomfort caused by hemorrhoids. Use hemorrhoid cream if your health care provider approves.  If you develop varicose veins, wear support hose. Elevate your feet for 15 minutes, 3-4 times a day. Limit salt in your diet.  Visit your dentist if you have not gone yet during your pregnancy. Use a soft toothbrush to brush your teeth and be gentle when you floss.  Keep all follow-up  prenatal visits as told by your health care provider. This is important. Contact a health care provider if:  You have dizziness.  You have mild pelvic cramps, pelvic pressure, or nagging pain in the abdominal area.  You have persistent nausea, vomiting, or diarrhea.  You have a bad smelling vaginal discharge.  You have pain with urination. Get help right away if:  You have a fever.  You are leaking fluid from your vagina.  You have spotting or bleeding from your vagina.  You have severe abdominal cramping or pain.  You have rapid weight gain or weight loss.  You have shortness of breath with chest pain.  You notice sudden or extreme swelling of your face, hands, ankles, feet, or legs.  You have not felt your baby move in over an hour.  You have severe headaches that do not go away with medicine.  You have vision changes. Summary  The second trimester is from week 13 through week 28 (months 4 through 6). It is also a time when the fetus is growing rapidly.  Your body goes  through many changes during pregnancy. The changes vary from woman to woman.  Avoid all smoking, herbs, alcohol, and unprescribed drugs. These chemicals affect the formation and growth your baby.  Do not use any tobacco products, such as cigarettes, chewing tobacco, and e-cigarettes. If you need help quitting, ask your health care provider.  Contact your health care provider if you have any questions. Keep all prenatal visits as told by your health care provider. This is important. This information is not intended to replace advice given to you by your health care provider. Make sure you discuss any questions you have with your health care provider. Document Released: 04/16/2001 Document Revised: 09/28/2015 Document Reviewed: 06/23/2012 Elsevier Interactive Patient Education  2017 [employer].  Contraception Choices Contraception (birth control) is the use of any methods or devices to prevent  pregnancy. Below are some methods to help avoid pregnancy. Hormonal methods  Contraceptive implant. This is a thin, plastic tube containing progesterone hormone. It does not contain estrogen hormone. Your health care provider inserts the tube in the inner part of the upper arm. The tube can remain in place for up to 3 years. After 3 years, the implant must be removed. The implant prevents the ovaries from releasing an egg (ovulation), thickens the cervical mucus to prevent sperm from entering the uterus, and thins the lining of the inside of the uterus.  Progesterone-only injections. These injections are given every 3 months by your health care provider to prevent pregnancy. This synthetic progesterone hormone stops the ovaries from releasing eggs. It also thickens cervical mucus and changes the uterine lining. This makes it harder for sperm to survive in the uterus.  Birth control pills. These pills contain estrogen and progesterone hormone. They work by preventing the ovaries from releasing eggs (ovulation). They also cause the cervical mucus to thicken, preventing the sperm from entering the uterus. Birth control pills are prescribed by a health care provider.Birth control pills can also be used to treat heavy periods.  Minipill. This type of birth control pill contains only the progesterone hormone. They are taken every day of each month and must be prescribed by your health care provider.  Birth control patch. The patch contains hormones similar to those in birth control pills. It must be changed once a week and is prescribed by a health care provider.  Vaginal ring. The ring contains hormones similar to those in birth control pills. It is left in the vagina for 3 weeks, removed for 1 week, and then a new one is put back in place. The patient must be comfortable inserting and removing the ring from the vagina.A health care provider's prescription is necessary.  Emergency contraception.  Emergency contraceptives prevent pregnancy after unprotected sexual intercourse. This pill can be taken right after sex or up to 5 days after unprotected sex. It is most effective the sooner you take the pills after having sexual intercourse. Most emergency contraceptive pills are available without a prescription. Check with your pharmacist. Do not use emergency contraception as your only form of birth control. Barrier methods  Female condom. This is a thin sheath (latex or rubber) that is worn over the penis during sexual intercourse. It can be used with spermicide to increase effectiveness.  Female condom. This is a soft, loose-fitting sheath that is put into the vagina before sexual intercourse.  Diaphragm. This is a soft, latex, dome-shaped barrier that must be fitted by a health care provider. It is inserted into the vagina, along with a  spermicidal jelly. It is inserted before intercourse. The diaphragm should be left in the vagina for 6 to 8 hours after intercourse.  Cervical cap. This is a round, soft, latex or plastic cup that fits over the cervix and must be fitted by a health care provider. The cap can be left in place for up to 48 hours after intercourse.  Sponge. This is a soft, circular piece of polyurethane foam. The sponge has spermicide in it. It is inserted into the vagina after wetting it and before sexual intercourse.  Spermicides. These are chemicals that kill or block sperm from entering the cervix and uterus. They come in the form of creams, jellies, suppositories, foam, or tablets. They do not require a prescription. They are inserted into the vagina with an applicator before having sexual intercourse. The process must be repeated every time you have sexual intercourse. Intrauterine contraception  Intrauterine device (IUD). This is a T-shaped device that is put in a woman's uterus during a menstrual period to prevent pregnancy. There are 2 types:  Copper IUD. This type of IUD  is wrapped in copper wire and is placed inside the uterus. Copper makes the uterus and fallopian tubes produce a fluid that kills sperm. It can stay in place for 10 years.  Hormone IUD. This type of IUD contains the hormone progestin (synthetic progesterone). The hormone thickens the cervical mucus and prevents sperm from entering the uterus, and it also thins the uterine lining to prevent implantation of a fertilized egg. The hormone can weaken or kill the sperm that get into the uterus. It can stay in place for 3-5 years, depending on which type of IUD is used. Permanent methods of contraception  Female tubal ligation. This is when the woman's fallopian tubes are surgically sealed, tied, or blocked to prevent the egg from traveling to the uterus.  Hysteroscopic sterilization. This involves placing a small coil or insert into each fallopian tube. Your doctor uses a technique called hysteroscopy to do the procedure. The device causes scar tissue to form. This results in permanent blockage of the fallopian tubes, so the sperm cannot fertilize the egg. It takes about 3 months after the procedure for the tubes to become blocked. You must use another form of birth control for these 3 months.  Female sterilization. This is when the female has the tubes that carry sperm tied off (vasectomy).This blocks sperm from entering the vagina during sexual intercourse. After the procedure, the man can still ejaculate fluid (semen). Natural planning methods  Natural family planning. This is not having sexual intercourse or using a barrier method (condom, diaphragm, cervical cap) on days the woman could become pregnant.  Calendar method. This is keeping track of the length of each menstrual cycle and identifying when you are fertile.  Ovulation method. This is avoiding sexual intercourse during ovulation.  Symptothermal method. This is avoiding sexual intercourse during ovulation, using a thermometer and ovulation  symptoms.  Post-ovulation method. This is timing sexual intercourse after you have ovulated. Regardless of which type or method of contraception you choose, it is important that you use condoms to protect against the transmission of sexually transmitted infections (STIs). Talk with your health care provider about which form of contraception is most appropriate for you. This information is not intended to replace advice given to you by your health care provider. Make sure you discuss any questions you have with your health care provider. Document Released: 04/22/2005 Document Revised: 09/28/2015 Document Reviewed: 10/15/2012 Elsevier Interactive  Patient Education  2017 [employer].   Breastfeeding Deciding to breastfeed is one of the best choices you can make for you and your baby. A change in hormones during pregnancy causes your breast tissue to grow and increases the number and size of your milk ducts. These hormones also allow proteins, sugars, and fats from your blood supply to make breast milk in your milk-producing glands. Hormones prevent breast milk from being released before your baby is born as well as prompt milk flow after birth. Once breastfeeding has begun, thoughts of your baby, as well as his or her sucking or crying, can stimulate the release of milk from your milk-producing glands. Benefits of breastfeeding For Your Baby  Your first milk (colostrum) helps your baby's digestive system function better.  There are antibodies in your milk that help your baby fight off infections.  Your baby has a lower incidence of asthma, allergies, and sudden infant death syndrome.  The nutrients in breast milk are better for your baby than infant formulas and are designed uniquely for your baby's needs.  Breast milk improves your baby's brain development.  Your baby is less likely to develop other conditions, such as childhood obesity, asthma, or type 2 diabetes mellitus. For  You  Breastfeeding helps to create a very special bond between you and your baby.  Breastfeeding is convenient. Breast milk is always available at the correct temperature and costs nothing.  Breastfeeding helps to burn calories and helps you lose the weight gained during pregnancy.  Breastfeeding makes your uterus contract to its prepregnancy size faster and slows bleeding (lochia) after you give birth.  Breastfeeding helps to lower your risk of developing type 2 diabetes mellitus, osteoporosis, and breast or ovarian cancer later in life. Signs that your baby is hungry Early Signs of Hunger  Increased alertness or activity.  Stretching.  Movement of the head from side to side.  Movement of the head and opening of the mouth when the corner of the mouth or cheek is stroked (rooting).  Increased sucking sounds, smacking lips, cooing, sighing, or squeaking.  Hand-to-mouth movements.  Increased sucking of fingers or hands. Late Signs of Hunger  Fussing.  Intermittent crying. Extreme Signs of Hunger  Signs of extreme hunger will require calming and consoling before your baby will be able to breastfeed successfully. Do not wait for the following signs of extreme hunger to occur before you initiate breastfeeding:  Restlessness.  A loud, strong cry.  Screaming. Breastfeeding basics  Breastfeeding Initiation  Find a comfortable place to sit or lie down, with your neck and back well supported.  Place a pillow or rolled up blanket under your baby to bring him or her to the level of your breast (if you are seated). Nursing pillows are specially designed to help support your arms and your baby while you breastfeed.  Make sure that your baby's abdomen is facing your abdomen.  Gently massage your breast. With your fingertips, massage from your chest wall toward your nipple in a circular motion. This encourages milk flow. You may need to continue this action during the feeding if your  milk flows slowly.  Support your breast with 4 fingers underneath and your thumb above your nipple. Make sure your fingers are well away from your nipple and your baby's mouth.  Stroke your baby's lips gently with your finger or nipple.  When your baby's mouth is open wide enough, quickly bring your baby to your breast, placing your entire nipple and  as much of the colored area around your nipple (areola) as possible into your baby's mouth.  More areola should be visible above your baby's upper lip than below the lower lip.  Your baby's tongue should be between his or her lower gum and your breast.  Ensure that your baby's mouth is correctly positioned around your nipple (latched). Your baby's lips should create a seal on your breast and be turned out (everted).  It is common for your baby to suck about 2-3 minutes in order to start the flow of breast milk. Latching  Teaching your baby how to latch on to your breast properly is very important. An improper latch can cause nipple pain and decreased milk supply for you and poor weight gain in your baby. Also, if your baby is not latched onto your nipple properly, he or she may swallow some air during feeding. This can make your baby fussy. Burping your baby when you switch breasts during the feeding can help to get rid of the air. However, teaching your baby to latch on properly is still the best way to prevent fussiness from swallowing air while breastfeeding. Signs that your baby has successfully latched on to your nipple:  Silent tugging or silent sucking, without causing you pain.  Swallowing heard between every 3-4 sucks.  Muscle movement above and in front of his or her ears while sucking. Signs that your baby has not successfully latched on to nipple:  Sucking sounds or smacking sounds from your baby while breastfeeding.  Nipple pain. If you think your baby has not latched on correctly, slip your finger into the corner of your baby's  mouth to break the suction and place it between your baby's gums. Attempt breastfeeding initiation again. Signs of Successful Breastfeeding  Signs from your baby:  A gradual decrease in the number of sucks or complete cessation of sucking.  Falling asleep.  Relaxation of his or her body.  Retention of a small amount of milk in his or her mouth.  Letting go of your breast by himself or herself. Signs from you:  Breasts that have increased in firmness, weight, and size 1-3 hours after feeding.  Breasts that are softer immediately after breastfeeding.  Increased milk volume, as well as a change in milk consistency and color by the fifth day of breastfeeding.  Nipples that are not sore, cracked, or bleeding. Signs That Your [employer] is Getting Enough Milk  Wetting at least 1-2 diapers during the first 24 hours after birth.  Wetting at least 5-6 diapers every 24 hours for the first week after birth. The urine should be clear or pale yellow by 5 days after birth.  Wetting 6-8 diapers every 24 hours as your baby continues to grow and develop.  At least 3 stools in a 24-hour period by age 51 days. The stool should be soft and yellow.  At least 3 stools in a 24-hour period by age 24 days. The stool should be seedy and yellow.  No loss of weight greater than 10% of birth weight during the first 19 days of age.  Average weight gain of 4-7 ounces (113-198 g) per week after age 49 days.  Consistent daily weight gain by age 244 days, without weight loss after the age of 2 weeks. After a feeding, your baby may spit up a small amount. This is common. Breastfeeding frequency and duration Frequent feeding will help you make more milk and can prevent sore nipples and breast engorgement. Breastfeed  when you feel the need to reduce the fullness of your breasts or when your baby shows signs of hunger. This is called "breastfeeding on demand." Avoid introducing a pacifier to your baby while you are working  to establish breastfeeding (the first 4-6 weeks after your baby is born). After this time you may choose to use a pacifier. Research has shown that pacifier use during the first year of a baby's life decreases the risk of sudden infant death syndrome (SIDS). Allow your baby to feed on each breast as long as he or she wants. Breastfeed until your baby is finished feeding. When your baby unlatches or falls asleep while feeding from the first breast, offer the second breast. Because newborns are often sleepy in the first few weeks of life, you may need to awaken your baby to get him or her to feed. Breastfeeding times will vary from baby to baby. However, the following rules can serve as a guide to help you ensure that your baby is properly fed:  Newborns (babies 60 weeks of age or younger) may breastfeed every 1-3 hours.  Newborns should not go longer than 3 hours during the day or 5 hours during the night without breastfeeding.  You should breastfeed your baby a minimum of 8 times in a 24-hour period until you begin to introduce solid foods to your baby at around 47 months of age. Breast milk pumping Pumping and storing breast milk allows you to ensure that your baby is exclusively fed your breast milk, even at times when you are unable to breastfeed. This is especially important if you are going back to work while you are still breastfeeding or when you are not able to be present during feedings. Your lactation consultant can give you guidelines on how long it is safe to store breast milk. A breast pump is a machine that allows you to pump milk from your breast into a sterile bottle. The pumped breast milk can then be stored in a refrigerator or freezer. Some breast pumps are operated by hand, while others use electricity. Ask your lactation consultant which type will work best for you. Breast pumps can be purchased, but some hospitals and breastfeeding support groups lease breast pumps on a monthly basis.  A lactation consultant can teach you how to hand express breast milk, if you prefer not to use a pump. Caring for your breasts while you breastfeed Nipples can become dry, cracked, and sore while breastfeeding. The following recommendations can help keep your breasts moisturized and healthy:  Avoid using soap on your nipples.  Wear a supportive bra. Although not required, special nursing bras and tank tops are designed to allow access to your breasts for breastfeeding without taking off your entire bra or top. Avoid wearing underwire-style bras or extremely tight bras.  Air dry your nipples for 3-71minutes after each feeding.  Use only cotton bra pads to absorb leaked breast milk. Leaking of breast milk between feedings is normal.  Use lanolin on your nipples after breastfeeding. Lanolin helps to maintain your skin's normal moisture barrier. If you use pure lanolin, you do not need to wash it off before feeding your baby again. Pure lanolin is not toxic to your baby. You may also hand express a few drops of breast milk and gently massage that milk into your nipples and allow the milk to air dry. In the first few weeks after giving birth, some women experience extremely full breasts (engorgement). Engorgement can make your breasts  feel heavy, warm, and tender to the touch. Engorgement peaks within 3-5 days after you give birth. The following recommendations can help ease engorgement:  Completely empty your breasts while breastfeeding or pumping. You may want to start by applying warm, moist heat (in the shower or with warm water-soaked hand towels) just before feeding or pumping. This increases circulation and helps the milk flow. If your baby does not completely empty your breasts while breastfeeding, pump any extra milk after he or she is finished.  Wear a snug bra (nursing or regular) or tank top for 1-2 days to signal your body to slightly decrease milk production.  Apply ice packs to your  breasts, unless this is too uncomfortable for you.  Make sure that your baby is latched on and positioned properly while breastfeeding. If engorgement persists after 48 hours of following these recommendations, contact your health care provider or a Science writer. Overall health care recommendations while breastfeeding  Eat healthy foods. Alternate between meals and snacks, eating 3 of each per day. Because what you eat affects your breast milk, some of the foods may make your baby more irritable than usual. Avoid eating these foods if you are sure that they are negatively affecting your baby.  Drink milk, fruit juice, and water to satisfy your thirst (about 10 glasses a day).  Rest often, relax, and continue to take your prenatal vitamins to prevent fatigue, stress, and anemia.  Continue breast self-awareness checks.  Avoid chewing and smoking tobacco. Chemicals from cigarettes that pass into breast milk and exposure to secondhand smoke may harm your baby.  Avoid alcohol and drug use, including marijuana. Some medicines that may be harmful to your baby can pass through breast milk. It is important to ask your health care provider before taking any medicine, including all over-the-counter and prescription medicine as well as vitamin and herbal supplements. It is possible to become pregnant while breastfeeding. If birth control is desired, ask your health care provider about options that will be safe for your baby. Contact a health care provider if:  You feel like you want to stop breastfeeding or have become frustrated with breastfeeding.  You have painful breasts or nipples.  Your nipples are cracked or bleeding.  Your breasts are red, tender, or warm.  You have a swollen area on either breast.  You have a fever or chills.  You have nausea or vomiting.  You have drainage other than breast milk from your nipples.  Your breasts do not become full before feedings by the  fifth day after you give birth.  You feel sad and depressed.  Your baby is too sleepy to eat well.  Your baby is having trouble sleeping.  Your baby is wetting less than 3 diapers in a 24-hour period.  Your baby has less than 3 stools in a 24-hour period.  Your baby's skin or the white part of his or her eyes becomes yellow.  Your baby is not gaining weight by 43 days of age. Get help right away if:  Your baby is overly tired (lethargic) and does not want to wake up and feed.  Your baby develops an unexplained fever. This information is not intended to replace advice given to you by your health care provider. Make sure you discuss any questions you have with your health care provider. Document Released: 04/22/2005 Document Revised: 10/04/2015 Document Reviewed: 10/14/2012 Elsevier Interactive Patient Education  2017 [employer].  Trial of Labor After Cesarean Delivery Information A  trial of labor after cesarean delivery (TOLAC) is when a woman tries to give birth vaginally after a previous cesarean delivery. TOLAC may be a safe and appropriate option for you depending on your medical history and other risk factors. When TOLAC is successful and you are able to have a vaginal delivery, this is called a vaginal birth after cesarean delivery (VBAC).  CANDIDATES FOR TOLAC TOLAC is possible for some women who:  Have undergone one or two prior cesarean deliveries in which the incision of the uterus was horizontal (low transverse).  Are carrying twins and have had one prior low transverse incision during a cesarean delivery.  Do not have a vertical (classical) uterine scar.  Have not had a tear in the wall of their uterus (uterine rupture). TOLAC is also supported for women who meet appropriate criteria and:  Are under the age of 62 years.  Are tall and have a body mass index (BMI) of less than 30.  Have an unknown uterine scar.  Give birth in a facility equipped to handle an  emergency cesarean delivery. This team should be able to handle possible complications such as a uterine rupture.  Have thorough counseling about the benefits and risks of TOLAC.  Have discussed future pregnancy plans with their health care provider.  Plan to have several more pregnancies. MOST SUCCESSFUL CANDIDATES FOR TOLAC:  Have had a successful vaginal delivery before or after their cesarean delivery.  Experience labor that begins naturally on or before the due date (40 weeks of gestation).  Do not have a very large (macrosomic) baby.   Had a prior cesarean delivery but are not currently experiencing factors that would prompt a cesarean delivery (such as a breech position).  Had only one prior cesarean delivery.  Had a prior cesarean delivery that was performed early in labor and not after full cervical dilation. TOLAC may be most appropriate for women who meet the above guidelines and who plan to have more pregnancies. TOLAC is not recommended for home births. LEAST SUCCESSFUL CANDIDATES FOR TOLAC:  Have an induced labor with an unfavorable cervix. An unfavorable cervix is when the cervix is not dilating enough (among other factors).  Have never had a vaginal delivery.  Have had more than two cesarean deliveries.  Have a pregnancy at more than 40 weeks of gestation.  Are pregnant with a baby with a suspected weight greater than 4,000 grams (8 pounds) and who have no prior history of a vaginal delivery.  Have closely spaced pregnancies. SUGGESTED BENEFITS OF TOLAC  You may have a faster recovery time.  You may have a shorter stay in the hospital.  You may have less pain and fewer problems than with a cesarean delivery. Women who have a cesarean delivery have a higher chance of needing blood or getting a fever, an infection, or a blood clot in the legs. SUGGESTED RISKS OF TOLAC The highest risk of complications happens to women who attempt a TOLAC and fail. A failed  TOLAC results in an unplanned cesarean delivery. Risks related to [facility] or repeat cesarean deliveries include:   Blood loss.  Infection.  Blood clot.  Injury to surrounding tissues or organs.  Having to remove the uterus (hysterectomy).  Potential problems with the placenta (such as placenta previa or placenta accreta) in future pregnancies. Although very rare, the main concerns with TOLAC are:  Rupture of the uterine scar from a past cesarean delivery.  Needing an emergency cesarean delivery.  Having a bad  outcome for the baby (perinatal morbidity). FOR MORE INFORMATION American [employer] of [employer]st[employer]: www.acog.[place]: www.midwife.org This information is not intended to replace advice given to you by your health care provider. Make sure you discuss any questions you have with your health care provider. Document Released: 01/08/2011 Document Revised: 02/10/2013 Document Reviewed: 10/12/2012 Elsevier Interactive Patient Education  2017 [employer].

## 2016-06-27 ENCOUNTER — Ambulatory Visit (HOSPITAL_COMMUNITY)
Admission: RE | Admit: 2016-06-27 | Discharge: 2016-06-27 | Disposition: A | Discharge status: Home / Self Care | Payer: Medicaid Other | Source: Ambulatory Visit | Attending: Obstetrics and Gynecology | Admitting: Obstetrics and Gynecology

## 2016-06-27 ENCOUNTER — Other Ambulatory Visit: Payer: Self-pay | Admitting: Obstetrics and Gynecology

## 2016-06-27 DIAGNOSIS — Z363 Encounter for antenatal screening for malformations: Secondary | ICD-10-CM | POA: Diagnosis not present

## 2016-06-27 DIAGNOSIS — O34211 Maternal care for low transverse scar from previous cesarean delivery: Secondary | ICD-10-CM | POA: Diagnosis not present

## 2016-06-27 DIAGNOSIS — O99212 Obesity complicating pregnancy, second trimester: Secondary | ICD-10-CM

## 2016-06-27 DIAGNOSIS — O0932 Supervision of pregnancy with insufficient antenatal care, second trimester: Secondary | ICD-10-CM

## 2016-06-27 DIAGNOSIS — Z3A23 23 weeks gestation of pregnancy: Secondary | ICD-10-CM

## 2016-06-27 DIAGNOSIS — Z3480 Encounter for supervision of other normal pregnancy, unspecified trimester: Secondary | ICD-10-CM

## 2016-06-27 LAB — PRENATAL PROFILE I(LABCORP)
Antibody Screen: NEGATIVE
Basophils Absolute: 0 10*3/uL (ref 0.0–0.2)
Basos: 0 %
EOS (ABSOLUTE): 0.1 10*3/uL (ref 0.0–0.4)
Eos: 1 %
HEMOGLOBIN: 11.2 g/dL (ref 11.1–15.9)
Hematocrit: 33.9 % — ABNORMAL LOW (ref 34.0–46.6)
Hepatitis B Surface Ag: NEGATIVE
IMMATURE GRANS (ABS): 0.1 10*3/uL (ref 0.0–0.1)
IMMATURE GRANULOCYTES: 1 %
LYMPHS ABS: 2.1 10*3/uL (ref 0.7–3.1)
LYMPHS: 15 %
MCH: 26.8 pg (ref 26.6–33.0)
MCHC: 33 g/dL (ref 31.5–35.7)
MCV: 81 fL (ref 79–97)
MONOS ABS: 0.7 10*3/uL (ref 0.1–0.9)
Monocytes: 5 %
NEUTROS PCT: 78 %
Neutrophils Absolute: 10.5 10*3/uL — ABNORMAL HIGH (ref 1.4–7.0)
PLATELETS: 197 10*3/uL (ref 150–379)
RBC: 4.18 x10E6/uL (ref 3.77–5.28)
RDW: 13.7 % (ref 12.3–15.4)
RH TYPE: POSITIVE
RPR Ser Ql: NONREACTIVE
Rubella Antibodies, IGG: 2.79 index (ref >0.99)
WBC: 13.4 10*3/uL — ABNORMAL HIGH (ref 3.4–10.8)

## 2016-06-27 LAB — GC/CHLAMYDIA PROBE AMP (~~LOC~~) NOT AT ARMC
Chlamydia: NEGATIVE
Neisseria Gonorrhea: NEGATIVE

## 2016-06-27 LAB — HEMOGLOBIN A1C
Est. average glucose Bld gHb Est-mCnc: 108
HEMOGLOBIN A1C: 5.4 % (ref 4.8–5.6)

## 2016-07-08 ENCOUNTER — Encounter: Payer: Self-pay | Admitting: Obstetrics and Gynecology

## 2016-07-17 ENCOUNTER — Other Ambulatory Visit: Payer: Self-pay | Admitting: Medical

## 2016-07-17 ENCOUNTER — Ambulatory Visit (INDEPENDENT_AMBULATORY_CARE_PROVIDER_SITE_OTHER): Payer: Medicaid Other | Admitting: Obstetrics and Gynecology

## 2016-07-17 DIAGNOSIS — Z3492 Encounter for supervision of normal pregnancy, unspecified, second trimester: Secondary | ICD-10-CM

## 2016-07-17 DIAGNOSIS — B009 Herpesviral infection, unspecified: Secondary | ICD-10-CM | POA: Insufficient documentation

## 2016-07-17 DIAGNOSIS — O0992 Supervision of high risk pregnancy, unspecified, second trimester: Secondary | ICD-10-CM

## 2016-07-17 DIAGNOSIS — O09292 Supervision of pregnancy with other poor reproductive or obstetric history, second trimester: Secondary | ICD-10-CM | POA: Diagnosis not present

## 2016-07-17 DIAGNOSIS — Z3400 Encounter for supervision of normal first pregnancy, unspecified trimester: Secondary | ICD-10-CM

## 2016-07-17 DIAGNOSIS — O34219 Maternal care for unspecified type scar from previous cesarean delivery: Secondary | ICD-10-CM

## 2016-07-17 DIAGNOSIS — O98512 Other viral diseases complicating pregnancy, second trimester: Secondary | ICD-10-CM

## 2016-07-17 DIAGNOSIS — Z8632 Personal history of gestational diabetes: Secondary | ICD-10-CM

## 2016-07-17 DIAGNOSIS — O09299 Supervision of pregnancy with other poor reproductive or obstetric history, unspecified trimester: Secondary | ICD-10-CM

## 2016-07-17 NOTE — Progress Notes (Signed)
   PRENATAL VISIT NOTE  Subjective:  Debra Mills is a 31 y.o. G2P1001 at [redacted]w[redacted]d being seen today for ongoing prenatal care.  She is currently monitored for the following issues for this low-risk pregnancy and has Supervision of normal pregnancy, antepartum; Previous cesarean section complicating pregnancy, antepartum condition or complication; Insufficient prenatal care; HSV-2 infection complicating pregnancy, second trimester; and History of gestational diabetes in prior pregnancy, currently pregnant on her problem list.  Patient reports no complaints.   .  .   . Denies leaking of fluid.   The following portions of the patient's history were reviewed and updated as appropriate: allergies, current medications, past family history, past medical history, past social history, past surgical history and problem list. Problem list updated.  Objective:  There were no vitals filed for this visit.  Fetal Status:   Fundal Height: 28 cm       General:  Alert, oriented and cooperative. Patient is in no acute distress.  Skin: Skin is warm and dry. No rash noted.   Cardiovascular: Normal heart rate noted  Respiratory: Normal respiratory effort, no problems with respiration noted  Abdomen: Soft, gravid, appropriate for gestational age.       Pelvic:  Cervical exam deferred        Extremities: Normal range of motion.     Mental Status: Normal mood and affect. Normal behavior. Normal judgment and thought content.   Assessment and Plan:  Pregnancy: G2P1001 at 108w4d   1. Supervision of low-risk pregnancy, second trimester  - Glucose Tolerance, 2 Hours w/1 Hour - RPR - HIV antibody - CBC - [place] MFM OB FOLLOW UP; Future - Hgb A1C  2. Previous cesarean section complicating pregnancy, antepartum condition or complication - Patient undecided about TOLAC vs. Repeat.   3. HSV-2 infection complicating pregnancy, second trimester  - No outbreak currently  - Will start prophylaxis treatment @ 35 weeks  .  4.  History of gestational diabetes in prior pregnancy, currently pregnant  - Glucose tolerance test ordered    Preterm labor symptoms and general obstetric precautions including but not limited to vaginal bleeding, contractions, leaking of fluid and fetal movement were reviewed in detail with the patient. Please refer to After Visit Summary for other counseling recommendations.  Return in about 4 weeks (around 08/14/2016).   Lezlie Lye, NP

## 2016-07-17 NOTE — Patient Instructions (Addendum)
DTaP Vaccine (Diphtheria, Tetanus, and Pertussis): What You Need to Know 1. Why get vaccinated? Diphtheria, tetanus, and pertussis are serious diseases caused by bacteria. Diphtheria and pertussis are spread from person to person. Tetanus enters the body through cuts or wounds. DIPHTHERIA causes a thick covering in the back of the throat.  It can lead to breathing problems, paralysis, heart failure, and even death. TETANUS (Lockjaw) causes painful tightening of the muscles, usually all over the body.  It can lead to "locking" of the jaw so the victim cannot open his mouth or swallow. Tetanus leads to death in up to 2 out of 10 cases. PERTUSSIS (Whooping Cough) causes coughing spells so bad that it is hard for infants to eat, drink, or breathe. These spells can last for weeks.  It can lead to pneumonia, seizures (jerking and staring spells), brain damage, and death. Diphtheria, tetanus, and pertussis vaccine (DTaP) can help prevent these diseases. Most children who are vaccinated with DTaP will be protected throughout childhood. Many more children would get these diseases if we stopped vaccinating. DTaP is a safer version of an older vaccine called DTP. DTP is no longer used in the [place]. 2. Who should get DTaP vaccine and when? Children should get 5 doses of DTaP vaccine, one dose at each of the following ages:  2 months  4 months  6 months  15-18 months  4-6 years DTaP may be given at the same time as other vaccines. 3. Some children should not get DTaP vaccine or should wait  Children with minor illnesses, such as a cold, may be vaccinated. But children who are moderately or severely ill should usually wait until they recover before getting DTaP vaccine.  Any child who had a life-threatening allergic reaction after a dose of DTaP should not get another dose.  Any child who suffered a brain or nervous system disease within 7 days after a dose of DTaP should not get another  dose.  Talk with your doctor if your child:  had a seizure or collapsed after a dose of DTaP,  cried non-stop for 3 hours or more after a dose of DTaP,  had a fever over 105F after a dose of DTaP. Ask your doctor for more information. Some of these children should not get another dose of pertussis vaccine, but may get a vaccine without pertussis, called DT. 4. Older children and adults DTaP is not licensed for adolescents, adults, or children 11 years of age and older. But older people still need protection. A vaccine called Tdap is similar to DTaP. A single dose of Tdap is recommended for people 11 through 29 years of age. Another vaccine, called Td, protects against tetanus and diphtheria, but not pertussis. It is recommended every 10 years. There are separate Vaccine Information Statements for these vaccines. 5. What are the risks from DTaP vaccine? Getting diphtheria, tetanus, or pertussis disease is much riskier than getting DTaP vaccine. However, a vaccine, like any medicine, is capable of causing serious problems, such as severe allergic reactions. The risk of DTaP vaccine causing serious harm, or death, is extremely small. Mild problems (common)   Fever (up to about 1 child in 4)  Redness or swelling where the shot was given (up to about 1 child in 4)  Soreness or tenderness where the shot was given (up to about 1 child in 4) These problems occur more often after the 4th and 5th doses of the DTaP series than after earlier doses. Sometimes  the 4th or 5th dose of DTaP vaccine is followed by swelling of the entire arm or leg in which the shot was given, lasting 1-7 days (up to about 1 child in 33). Other mild problems include:   Fussiness (up to about 1 child in 3)  Tiredness or poor appetite (up to about 1 child in 10)  Vomiting (up to about 1 child in 75) These problems generally occur 1-3 days after the shot. Moderate problems (uncommon)   Seizure (jerking or staring) (about  1 child out of 14,000)  Non-stop crying, for 3 hours or more (up to about 1 child out of 1,000)  High fever, over 105F (about 1 child out of 16,000) Severe problems (very rare)   Serious allergic reaction (less than 1 out of a million doses)  Several other severe problems have been reported after DTaP vaccine. These include:  Long-term seizures, coma, or lowered consciousness  Permanent brain damage. These are so rare it is hard to tell if they are caused by the vaccine. Controlling fever is especially important for children who have had seizures, for any reason. It is also important if another family member has had seizures. You can reduce fever and pain by giving your child an aspirin-free pain reliever when the shot is given, and for the next 24 hours, following the package instructions. 6. What if there is a serious reaction? What should I look for?  Look for anything that concerns you, such as signs of a severe allergic reaction, very high fever, or behavior changes. Signs of a severe allergic reaction can include hives, swelling of the face and throat, difficulty breathing, a fast heartbeat, dizziness, and weakness. These would start a few minutes to a few hours after the vaccination. What should I do?   If you think it is a severe allergic reaction or other emergency that can't wait, call 9-1-1 or get the person to the nearest hospital. Otherwise, call your doctor.  Afterward, the reaction should be reported to the Vaccine Adverse Event Reporting System (VAERS). Your doctor might file this report, or you can do it yourself through the VAERS web site at www.vaers.[employer].es, or by calling 707-414-2302.  VAERS is only for reporting reactions. They do not give medical advice. 7. The National Vaccine Injury Compensation Program The [employer] Vaccine Injury Compensation Program (VICP) is a federal program that was created to compensate people who may have been injured by certain  vaccines. Persons who believe they may have been injured by a vaccine can learn about the program and about filing a claim by calling 9406934088 or visiting the [place] website at GoldCloset.com.ee. 8. How can I learn more?  Ask your doctor.  Call your local or state health department.  Contact the Centers for Disease Control and Prevention (CDC):  Call 614-435-1085 (1-800-CDC-INFO) or  Visit CDC's website at http://hunter.com/ CDC DTaP Vaccine (Diphtheria, Tetanus, and Pertussis) VIS (09/19/05) This information is not intended to replace advice given to you by your health care provider. Make sure you discuss any questions you have with your health care provider. Document Released: 02/17/2006 Document Revised: 01/11/2016 Document Reviewed: 01/11/2016 Elsevier Interactive Patient Education  2017 [employer]   Genital Herpes Genital herpes is a common sexually transmitted infection (STI) that is caused by a virus. The virus spreads from person to person through sexual contact. Infection can cause itching, blisters, and sores around the genitals or rectum. Symptoms may last several days and then go away This is called an  outbreak. However, the virus remains in your body, so you may have more outbreaks in the future. The time between outbreaks varies and can be months or years. Genital herpes affects men and women. It is particularly concerning for pregnant women because the virus can be passed to the baby during delivery and can cause serious problems. Genital herpes is also a concern for people who have a weak disease-fighting (immune) system. What are the causes? This condition is caused by the herpes simplex virus (HSV) type 1 or type 2. The virus may spread through:  Sexual contact with an infected person, including vaginal, anal, and oral sex.  Contact with fluid from a herpes sore.  The skin. This means that you can get herpes from an infected partner even if he  or she does not have a visible sore or does not know that he or she is infected. What increases the risk? You are more likely to develop this condition if:  You have sex with many partners.  You do not use latex condoms during sex. What are the signs or symptoms? Most people do not have symptoms (asymptomatic) or have mild symptoms that may be mistaken for other skin problems. Symptoms may include:  Small red bumps near the genitals, rectum, or mouth. These bumps turn into blisters and then turn into sores.  Flu-like symptoms, including:  Fever.  Body aches.  Swollen lymph nodes.  Headache.  Painful urination.  Pain and itching in the genital area or rectal area.  Vaginal discharge.  Tingling or shooting pain in the legs and buttocks. Generally, symptoms are more severe and last longer during the first (primary) outbreak. Flu-like symptoms are also more common during the primary outbreak. How is this diagnosed? Genital herpes may be diagnosed based on:  A physical exam.  Your medical history.  Blood tests.  A test of a fluid sample (culture) from an open sore. How is this treated? There is no cure for this condition, but treatment with antiviral medicines that are taken by mouth (orally) can do the following:  Speed up healing and relieve symptoms.  Help to reduce the spread of the virus to sexual partners.  Limit the chance of future outbreaks, or make future outbreaks shorter.  Lessen symptoms of future outbreaks. Your health care provider may also recommend pain relief medicines, such as aspirin or ibuprofen. Follow these instructions at home: Sexual activity   Do not have sexual contact during active outbreaks.  Practice safe sex. Latex condoms and female condoms may help prevent the spread of the herpes virus. General instructions   Keep the affected areas dry and clean.  Take over-the-counter and prescription medicines only as told by your health  care provider.  Avoid rubbing or touching blisters and sores. If you do touch blisters or sores:  Wash your hands thoroughly with soap and water.  Do not touch your eyes afterward.  To help relieve pain or itching, you may take the following actions as directed by your health care provider:  Apply a cold, wet cloth (cold compress) to affected areas 4-6 times a day.  Apply a substance that protects your skin and reduces bleeding (astringent).  Apply a gel that helps relieve pain around sores (lidocaine gel).  Take a warm, shallow bath that cleans the genital area (sitz bath).  Keep all follow-up visits as told by your health care provider. This is important. How is this prevented?  Use condoms. Although anyone can get genital herpes during  sexual contact, even with the use of a condom, a condom can provide some protection.  Avoid having multiple sexual partners.  Talk with your sexual partner about any symptoms either of you may have. Also, talk with your partner about any history of STIs.  Get tested for STIs before you have sex. Ask your partner to do the same.  Do not have sexual contact if you have symptoms of genital herpes. Contact a health care provider if:  Your symptoms are not improving with medicine.  Your symptoms return.  You have new symptoms.  You have a fever.  You have abdominal pain.  You have redness, swelling, or pain in your eye.  You notice new sores on other parts of your body.  You are a woman and experience bleeding between menstrual periods.  You have had herpes and you become pregnant or plan to become pregnant. Summary  Genital herpes is a common sexually transmitted infection (STI) that is caused by the herpes simplex virus (HSV) type 1 or type 2.  These viruses are most often spread through sexual contact with an infected person.  You are more likely to develop this condition if you have sex with many partners or you have unprotected  sex.  Most people do not have symptoms (asymptomatic) or have mild symptoms that may be mistaken for other skin problems. Symptoms occur as outbreaks that may happen months or years apart.  There is no cure for this condition, but treatment with oral antiviral medicines can reduce symptoms, reduce the chance of spreading the virus to a partner, prevent future outbreaks, or shorten future outbreaks. This information is not intended to replace advice given to you by your health care provider. Make sure you discuss any questions you have with your health care provider. Document Released: 04/19/2000 Document Revised: 03/22/2016 Document Reviewed: 03/22/2016 Elsevier Interactive Patient Education  2017 [employer].

## 2016-07-18 LAB — GLUCOSE TOLERANCE, 2 HOURS W/ 1HR
GLUCOSE, FASTING: 98 mg/dL — AB (ref 65–91)
Glucose, 1 hour: 169 mg/dL (ref 65–179)
Glucose, 2 hour: 107 mg/dL (ref 65–152)

## 2016-07-18 LAB — RPR: RPR Ser Ql: NONREACTIVE

## 2016-07-18 LAB — CBC
HEMATOCRIT: 35.3 % (ref 34.0–46.6)
HEMOGLOBIN: 11.5 g/dL (ref 11.1–15.9)
MCH: 26.6 pg (ref 26.6–33.0)
MCHC: 32.6 g/dL (ref 31.5–35.7)
MCV: 82 fL (ref 79–97)
Platelets: 176 10*3/uL (ref 150–379)
RBC: 4.33 x10E6/uL (ref 3.77–5.28)
RDW: 13.6 % (ref 12.3–15.4)
WBC: 13.8 10*3/uL — ABNORMAL HIGH (ref 3.4–10.8)

## 2016-07-18 LAB — HIV ANTIBODY (ROUTINE TESTING W REFLEX): HIV Screen 4th Generation wRfx: NONREACTIVE

## 2016-07-20 DIAGNOSIS — O24410 Gestational diabetes mellitus in pregnancy, diet controlled: Secondary | ICD-10-CM | POA: Insufficient documentation

## 2016-07-23 ENCOUNTER — Other Ambulatory Visit: Payer: Self-pay | Admitting: Obstetrics and Gynecology

## 2016-07-23 ENCOUNTER — Telehealth: Payer: Self-pay

## 2016-07-23 ENCOUNTER — Ambulatory Visit (HOSPITAL_COMMUNITY)
Admission: RE | Admit: 2016-07-23 | Discharge: 2016-07-23 | Disposition: A | Discharge status: Home / Self Care | Payer: Medicaid Other | Source: Ambulatory Visit | Attending: Obstetrics and Gynecology | Admitting: Obstetrics and Gynecology

## 2016-07-23 DIAGNOSIS — O34219 Maternal care for unspecified type scar from previous cesarean delivery: Secondary | ICD-10-CM

## 2016-07-23 DIAGNOSIS — O34211 Maternal care for low transverse scar from previous cesarean delivery: Secondary | ICD-10-CM | POA: Diagnosis not present

## 2016-07-23 DIAGNOSIS — O0932 Supervision of pregnancy with insufficient antenatal care, second trimester: Secondary | ICD-10-CM | POA: Insufficient documentation

## 2016-07-23 DIAGNOSIS — Z3A27 27 weeks gestation of pregnancy: Secondary | ICD-10-CM | POA: Diagnosis not present

## 2016-07-23 DIAGNOSIS — O99212 Obesity complicating pregnancy, second trimester: Secondary | ICD-10-CM | POA: Diagnosis not present

## 2016-07-23 DIAGNOSIS — Z362 Encounter for other antenatal screening follow-up: Secondary | ICD-10-CM

## 2016-07-23 DIAGNOSIS — Z3492 Encounter for supervision of normal pregnancy, unspecified, second trimester: Secondary | ICD-10-CM

## 2016-07-23 DIAGNOSIS — O283 Abnormal ultrasonic finding on antenatal screening of mother: Secondary | ICD-10-CM | POA: Insufficient documentation

## 2016-07-23 NOTE — Telephone Encounter (Signed)
-----   Message from Lezlie Lye, NP sent at 07/20/2016  8:39 PM EDT ----- Failed 2 hour. Patient will need visit with provider and diabetes coordinator please.  Thank you!

## 2016-07-23 NOTE — Telephone Encounter (Signed)
Called patient to discuss her results of diabetes. I have scheduled her appointment but patient stated she does not feel like she needs to be educate about diabetes. Patient stated she has a meter and she is aware of what foods to eat. I advised patient to call the office to scheduled another as this is very important part of her health. Patient voice understanding at this time.

## 2016-07-29 ENCOUNTER — Other Ambulatory Visit: Payer: Medicaid Other

## 2016-07-30 ENCOUNTER — Encounter: Payer: Self-pay | Admitting: General Practice

## 2016-08-12 ENCOUNTER — Other Ambulatory Visit: Payer: Medicaid Other

## 2016-08-14 ENCOUNTER — Ambulatory Visit (INDEPENDENT_AMBULATORY_CARE_PROVIDER_SITE_OTHER): Payer: Medicaid Other | Admitting: Obstetrics and Gynecology

## 2016-08-14 ENCOUNTER — Encounter: Payer: Medicaid Other | Admitting: Obstetrics and Gynecology

## 2016-08-14 ENCOUNTER — Other Ambulatory Visit: Payer: Self-pay | Admitting: Physician Assistant

## 2016-08-14 VITALS — BP 110/66 | HR 88 | Wt 183.2 lb

## 2016-08-14 DIAGNOSIS — O24410 Gestational diabetes mellitus in pregnancy, diet controlled: Secondary | ICD-10-CM | POA: Diagnosis present

## 2016-08-14 DIAGNOSIS — O0993 Supervision of high risk pregnancy, unspecified, third trimester: Secondary | ICD-10-CM | POA: Diagnosis not present

## 2016-08-14 LAB — POCT URINALYSIS DIP (DEVICE)
Bilirubin Urine: NEGATIVE
GLUCOSE, UA: NEGATIVE mg/dL
HGB URINE DIPSTICK: NEGATIVE
Ketones, ur: NEGATIVE mg/dL
Leukocytes, UA: NEGATIVE
Nitrite: NEGATIVE
PROTEIN: NEGATIVE mg/dL
SPECIFIC GRAVITY, URINE: 1.01 (ref 1.005–1.030)
UROBILINOGEN UA: 0.2 mg/dL (ref 0.0–1.0)
pH: 5.5 (ref 5.0–8.0)

## 2016-08-14 MED ORDER — ACCU-CHEK SOFT TOUCH LANCETS MISC: 1.0000 | Freq: Four times a day (QID) | 12 refills | Status: DC

## 2016-08-14 MED ORDER — GLUCOSE BLOOD VI STRP: 1.0000 | ORAL_STRIP | Freq: Four times a day (QID) | 12 refills | Status: DC

## 2016-08-14 MED ORDER — ASPIRIN 81 MG PO CHEW: 81.0000 mg | CHEWABLE_TABLET | Freq: Every day | ORAL | 3 refills | Status: DC

## 2016-08-14 NOTE — Addendum Note (Signed)
Addended by: Riccardo Dubin on: 08/14/2016 11:10 AM   Modules accepted: Orders

## 2016-08-14 NOTE — Progress Notes (Signed)
   PRENATAL VISIT NOTE  Subjective:  Debra Mills is a 31 y.o. G2P1001 at 75w4dbeing seen today for ongoing prenatal care.  She is currently monitored for the following issues for this high-risk pregnancy and has Supervision of high risk pregnancy, antepartum, third trimester; Previous cesarean section complicating pregnancy, antepartum condition or complication; Insufficient prenatal care; HSV-2 infection complicating pregnancy, second trimester; and Gestational diabetes, diet controlled on her problem list.  Patient reports no complaints.  Contractions: Not present. Vag. Bleeding: None.  Movement: Present. Denies leaking of fluid.   The following portions of the patient's history were reviewed and updated as appropriate: allergies, current medications, past family history, past medical history, past social history, past surgical history and problem list. Problem list updated.  Objective:   Vitals:   08/14/16 0941  BP: 110/66  Pulse: 88  Weight: 183 lb 3.2 oz (83.1 kg)    Fetal Status: Fetal Heart Rate (bpm): 129   Movement: Present     General:  Alert, oriented and cooperative. Patient is in no acute distress.  Skin: Skin is warm and dry. No rash noted.   Cardiovascular: Normal heart rate noted  Respiratory: Normal respiratory effort, no problems with respiration noted  Abdomen: Soft, gravid, appropriate for gestational age. Pain/Pressure: Present     Pelvic:  Cervical exam deferred        Extremities: Normal range of motion.  Edema: None  Mental Status: Normal mood and affect. Normal behavior. Normal judgment and thought content.   Assessment and Plan:  Pregnancy: G2P1001 at 369w4d1. Diet controlled gestational diabetes mellitus (GDM) in third trimester - Comp Met (CMET) - Protein / Creatinine Ratio, Urine - aspirin 81 MG chewable tablet; Chew 1 tablet (81 mg total) by mouth daily.  Dispense: 30 tablet; Refill: 3 -Patient is not checking her sugars on a regular basis. She is  intermittently checking. She has not checked any fasting values. She reports her post prandials in the 120's. She was given the sheet to record her sugars and should return in 2 wks. -baby is measuring 32 wks. Will likely need growth [place] will wait to see what sugars and measurement is at next visit.  -test strips and lancets ordered today.   2. Supervision of high risk pregnancy, antepartum, third trimester Refuses TDAP  Preterm labor symptoms and general obstetric precautions including but not limited to vaginal bleeding, contractions, leaking of fluid and fetal movement were reviewed in detail with the patient. Please refer to After Visit Summary for other counseling recommendations.  No Follow-up on file.   NiWaldemar DickensMD

## 2016-08-14 NOTE — Patient Instructions (Signed)
Diabetes Mellitus and Exercise Exercising regularly is important for your overall health, especially when you have diabetes (diabetes mellitus). Exercising is not only about losing weight. It has many health benefits, such as increasing muscle strength and bone density and reducing body fat and stress. This leads to improved fitness, flexibility, and endurance, all of which result in better overall health. Exercise has additional benefits for people with diabetes, including:  Reducing appetite.  Helping to lower and control blood glucose.  Lowering blood pressure.  Helping to control amounts of fatty substances (lipids) in the blood, such as cholesterol and triglycerides.  Helping the body to respond better to insulin (improving insulin sensitivity).  Reducing how much insulin the body needs.  Decreasing the risk for heart disease by:  Lowering cholesterol and triglyceride levels.  Increasing the levels of good cholesterol.  Lowering blood glucose levels. What is my activity plan? Your health care provider or certified diabetes educator can help you make a plan for the type and frequency of exercise (activity plan) that works for you. Make sure that you:  Do at least 150 minutes of moderate-intensity or vigorous-intensity exercise each week. This could be brisk walking, biking, or water aerobics.  Do stretching and strength exercises, such as yoga or weightlifting, at least 2 times a week.  Spread out your activity over at least 3 days of the week.  Get some form of physical activity every day.  Do not go more than 2 days in a row without some kind of physical activity.  Avoid being inactive for more than 90 minutes at a time. Take frequent breaks to walk or stretch.  Choose a type of exercise or activity that you enjoy, and set realistic goals.  Start slowly, and gradually increase the intensity of your exercise over time. What do I need to know about managing my  diabetes?  Check your blood glucose before and after exercising.  If your blood glucose is higher than 240 mg/dL (13.3 mmol/L) before you exercise, check your urine for ketones. If you have ketones in your urine, do not exercise until your blood glucose returns to normal.  Know the symptoms of low blood glucose (hypoglycemia) and how to treat it. Your risk for hypoglycemia increases during and after exercise. Common symptoms of hypoglycemia can include:  Hunger.  Anxiety.  Sweating and feeling clammy.  Confusion.  Dizziness or feeling light-headed.  Increased heart rate or palpitations.  Blurry vision.  Tingling or numbness around the mouth, lips, or tongue.  Tremors or shakes.  Irritability.  Keep a rapid-acting carbohydrate snack available before, during, and after exercise to help prevent or treat hypoglycemia.  Avoid injecting insulin into areas of the body that are going to be exercised. For example, avoid injecting insulin into:  The arms, when playing tennis.  The legs, when jogging.  Keep records of your exercise habits. Doing this can help you and your health care provider adjust your diabetes management plan as needed. Write down:  Food that you eat before and after you exercise.  Blood glucose levels before and after you exercise.  The type and amount of exercise you have done.  When your insulin is expected to peak, if you use insulin. Avoid exercising at times when your insulin is peaking.  When you start a new exercise or activity, work with your health care provider to make sure the activity is safe for you, and to adjust your insulin, medicines, or food intake as needed.  Drink plenty   of water while you exercise to prevent dehydration or heat stroke. Drink enough fluid to keep your urine clear or pale yellow. This information is not intended to replace advice given to you by your health care provider. Make sure you discuss any questions you have with  your health care provider. Document Released: 07/13/2003 Document Revised: 11/10/2015 Document Reviewed: 10/02/2015 Elsevier Interactive Patient Education  2017 [employer]  

## 2016-08-15 LAB — COMPREHENSIVE METABOLIC PANEL
A/G RATIO: 1.1 — AB (ref 1.2–2.2)
ALT: 9 IU/L (ref 0–32)
AST: 14 IU/L (ref 0–40)
Albumin: 3.5 g/dL (ref 3.5–5.5)
Alkaline Phosphatase: 163 IU/L — ABNORMAL HIGH (ref 39–117)
BUN/Creatinine Ratio: 11 (ref 9–23)
BUN: 5 mg/dL — ABNORMAL LOW (ref 6–20)
Bilirubin Total: <0.2 mg/dL (ref 0.0–1.2)
CHLORIDE: 101 mmol/L (ref 96–106)
CO2: 22 mmol/L (ref 18–29)
Calcium: 9.6 mg/dL (ref 8.7–10.2)
Creatinine, Ser: 0.44 mg/dL — ABNORMAL LOW (ref 0.57–1.00)
GFR calc Af Amer: 159 mL/min/{1.73_m2} (ref >59)
GFR, EST NON AFRICAN AMERICAN: 138 mL/min/{1.73_m2} (ref >59)
GLOBULIN, TOTAL: 3.3 g/dL (ref 1.5–4.5)
Glucose: 116 mg/dL — ABNORMAL HIGH (ref 65–99)
POTASSIUM: 3.9 mmol/L (ref 3.5–5.2)
SODIUM: 137 mmol/L (ref 134–144)
Total Protein: 6.8 g/dL (ref 6.0–8.5)

## 2016-08-15 LAB — PROTEIN / CREATININE RATIO, URINE
Creatinine, Urine: 39.4 mg/dL
PROTEIN UR: 9.3 mg/dL
Protein/Creat Ratio: 236 mg/g creat — ABNORMAL HIGH (ref 0–200)

## 2016-08-29 ENCOUNTER — Ambulatory Visit (INDEPENDENT_AMBULATORY_CARE_PROVIDER_SITE_OTHER): Payer: Medicaid Other | Admitting: Obstetrics and Gynecology

## 2016-08-29 ENCOUNTER — Encounter (HOSPITAL_COMMUNITY): Payer: Self-pay

## 2016-08-29 VITALS — BP 119/62 | HR 108 | Wt 184.8 lb

## 2016-08-29 DIAGNOSIS — O24410 Gestational diabetes mellitus in pregnancy, diet controlled: Secondary | ICD-10-CM | POA: Diagnosis not present

## 2016-08-29 DIAGNOSIS — O0933 Supervision of pregnancy with insufficient antenatal care, third trimester: Secondary | ICD-10-CM | POA: Diagnosis not present

## 2016-08-29 DIAGNOSIS — O34219 Maternal care for unspecified type scar from previous cesarean delivery: Secondary | ICD-10-CM

## 2016-08-29 DIAGNOSIS — O0993 Supervision of high risk pregnancy, unspecified, third trimester: Secondary | ICD-10-CM

## 2016-08-29 MED ORDER — ACCU-CHEK NANO SMARTVIEW W/DEVICE KIT: 1.0000 | PACK | 0 refills | Status: DC

## 2016-08-29 NOTE — Progress Notes (Signed)
   PRENATAL VISIT NOTE  Subjective:  Debra Mills is a 31 y.o. G2P1001 at [redacted]w[redacted]d being seen today for ongoing prenatal care.  She is currently monitored for the following issues for this high-risk pregnancy and has Supervision of high risk pregnancy, antepartum, third trimester; Previous cesarean section complicating pregnancy, antepartum condition or complication; Insufficient prenatal care; HSV-2 infection complicating pregnancy, second trimester; and Gestational diabetes, diet controlled on her problem list.  Patient reports no complaints.  Contractions: Irritability. Vag. Bleeding: None.  Movement: Present. Denies leaking of fluid.   The following portions of the patient's history were reviewed and updated as appropriate: allergies, current medications, past family history, past medical history, past social history, past surgical history and problem list. Problem list updated.  Objective:   Vitals:   08/29/16 1446  BP: 119/62  Pulse: (!) 108  Weight: 184 lb 12.8 oz (83.8 kg)    Fetal Status: Fetal Heart Rate (bpm): 131 Fundal Height: 33 cm Movement: Present     General:  Alert, oriented and cooperative. Patient is in no acute distress.  Skin: Skin is warm and dry. No rash noted.   Cardiovascular: Normal heart rate noted  Respiratory: Normal respiratory effort, no problems with respiration noted  Abdomen: Soft, gravid, appropriate for gestational age. Pain/Pressure: Present     Pelvic:  Cervical exam deferred        Extremities: Normal range of motion.  Edema: None  Mental Status: Normal mood and affect. Normal behavior. Normal judgment and thought content.   Assessment and Plan:  Pregnancy: G2P1001 at [redacted]w[redacted]d  1. Previous cesarean section complicating pregnancy, antepartum condition or complication Patient desires repeat cesarean section- information sent to be scheduled at 39 weeks on 6/9  2. Insufficient prenatal care in third trimester   3. Diet controlled gestational  diabetes mellitus (GDM) in third trimester CBG well controlled- all values within range Congratulated the patient on her effors  4. Supervision of high risk pregnancy, antepartum, third trimester Patient is doing well without complaints  Preterm labor symptoms and general obstetric precautions including but not limited to vaginal bleeding, contractions, leaking of fluid and fetal movement were reviewed in detail with the patient. Please refer to After Visit Summary for other counseling recommendations.  Return in about 2 weeks (around 09/12/2016) for [place].   Mora Bellman, MD

## 2016-09-12 ENCOUNTER — Ambulatory Visit (INDEPENDENT_AMBULATORY_CARE_PROVIDER_SITE_OTHER): Payer: Medicaid Other | Admitting: Family Medicine

## 2016-09-12 VITALS — BP 108/65 | HR 93 | Wt 185.4 lb

## 2016-09-12 DIAGNOSIS — O24410 Gestational diabetes mellitus in pregnancy, diet controlled: Secondary | ICD-10-CM

## 2016-09-12 DIAGNOSIS — O0993 Supervision of high risk pregnancy, unspecified, third trimester: Secondary | ICD-10-CM

## 2016-09-12 DIAGNOSIS — E049 Nontoxic goiter, unspecified: Secondary | ICD-10-CM

## 2016-09-12 DIAGNOSIS — O34219 Maternal care for unspecified type scar from previous cesarean delivery: Secondary | ICD-10-CM | POA: Diagnosis not present

## 2016-09-12 NOTE — Patient Instructions (Signed)
 Third Trimester of Pregnancy The third trimester is from week 28 through week 40 (months 7 through 9). The third trimester is a time when the unborn baby (fetus) is growing rapidly. At the end of the ninth month, the fetus is about 20 inches in length and weighs 6-10 pounds. Body changes during your third trimester Your body will continue to go through many changes during pregnancy. The changes vary from woman to woman. During the third trimester:  Your weight will continue to increase. You can expect to gain 25-35 pounds (11-16 kg) by the end of the pregnancy.  You may begin to get stretch marks on your hips, abdomen, and breasts.  You may urinate more often because the fetus is moving lower into your pelvis and pressing on your bladder.  You may develop or continue to have heartburn. This is caused by increased hormones that slow down muscles in the digestive tract.  You may develop or continue to have constipation because increased hormones slow digestion and cause the muscles that push waste through your intestines to relax.  You may develop hemorrhoids. These are swollen veins (varicose veins) in the rectum that can itch or be painful.  You may develop swollen, bulging veins (varicose veins) in your legs.  You may have increased body aches in the pelvis, back, or thighs. This is due to weight gain and increased hormones that are relaxing your joints.  You may have changes in your hair. These can include thickening of your hair, rapid growth, and changes in texture. Some women also have hair loss during or after pregnancy, or hair that feels dry or thin. Your hair will most likely return to normal after your baby is born.  Your breasts will continue to grow and they will continue to become tender. A yellow fluid (colostrum) may leak from your breasts. This is the first milk you are producing for your baby.  Your belly button may stick out.  You may notice more swelling in your  hands, face, or ankles.  You may have increased tingling or numbness in your hands, arms, and legs. The skin on your belly may also feel numb.  You may feel short of breath because of your expanding uterus.  You may have more problems sleeping. This can be caused by the size of your belly, increased need to urinate, and an increase in your body's metabolism.  You may notice the fetus "dropping," or moving lower in your abdomen (lightening).  You may have increased vaginal discharge.  You may notice your joints feel loose and you may have pain around your pelvic bone.  What to expect at prenatal visits You will have prenatal exams every 2 weeks until week 36. Then you will have weekly prenatal exams. During a routine prenatal visit:  You will be weighed to make sure you and the baby are growing normally.  Your blood pressure will be taken.  Your abdomen will be measured to track your baby's growth.  The fetal heartbeat will be listened to.  Any test results from the previous visit will be discussed.  You may have a cervical check near your due date to see if your cervix has softened or thinned (effaced).  You will be tested for Group B streptococcus. This happens between 35 and 37 weeks.  Your health care provider may ask you:  What your birth plan is.  How you are feeling.  If you are feeling the baby move.  If you have   had any abnormal symptoms, such as leaking fluid, bleeding, severe headaches, or abdominal cramping.  If you are using any tobacco products, including cigarettes, chewing tobacco, and electronic cigarettes.  If you have any questions.  Other tests or screenings that may be performed during your third trimester include:  Blood tests that check for low iron levels (anemia).  Fetal testing to check the health, activity level, and growth of the fetus. Testing is done if you have certain medical conditions or if there are problems during the  pregnancy.  Nonstress test (NST). This test checks the health of your baby to make sure there are no signs of problems, such as the baby not getting enough oxygen. During this test, a belt is placed around your belly. The baby is made to move, and its heart rate is monitored during movement.  What is false labor? False labor is a condition in which you feel small, irregular tightenings of the muscles in the womb (contractions) that usually go away with rest, changing position, or drinking water. These are called Braxton Hicks contractions. Contractions may last for hours, days, or even weeks before true labor sets in. If contractions come at regular intervals, become more frequent, increase in intensity, or become painful, you should see your health care provider. What are the signs of labor?  Abdominal cramps.  Regular contractions that start at 10 minutes apart and become stronger and more frequent with time.  Contractions that start on the top of the uterus and spread down to the lower abdomen and back.  Increased pelvic pressure and dull back pain.  A watery or bloody mucus discharge that comes from the vagina.  Leaking of amniotic fluid. This is also known as your "water breaking." It could be a slow trickle or a gush. Let your health care provider know if it has a color or strange odor. If you have any of these signs, call your health care provider right away, even if it is before your due date. Follow these instructions at home: Medicines  Follow your health care provider's instructions regarding medicine use. Specific medicines may be either safe or unsafe to take during pregnancy.  Take a prenatal vitamin that contains at least 600 micrograms (mcg) of folic acid.  If you develop constipation, try taking a stool softener if your health care provider approves. Eating and drinking  Eat a balanced diet that includes fresh fruits and vegetables, whole grains, good sources of protein  such as meat, eggs, or tofu, and low-fat dairy. Your health care provider will help you determine the amount of weight gain that is right for you.  Avoid raw meat and uncooked cheese. These carry germs that can cause birth defects in the baby.  If you have low calcium intake from food, talk to your health care provider about whether you should take a daily calcium supplement.  Eat four or five small meals rather than three large meals a day.  Limit foods that are high in fat and processed sugars, such as fried and sweet foods.  To prevent constipation: ? Drink enough fluid to keep your urine clear or pale yellow. ? Eat foods that are high in fiber, such as fresh fruits and vegetables, whole grains, and beans. Activity  Exercise only as directed by your health care provider. Most women can continue their usual exercise routine during pregnancy. Try to exercise for 30 minutes at least 5 days a week. Stop exercising if you experience uterine contractions.  Avoid   heavy lifting.  Do not exercise in extreme heat or humidity, or at high altitudes.  Wear low-heel, comfortable shoes.  Practice good posture.  You may continue to have sex unless your health care provider tells you otherwise. Relieving pain and discomfort  Take frequent breaks and rest with your legs elevated if you have leg cramps or low back pain.  Take warm sitz baths to soothe any pain or discomfort caused by hemorrhoids. Use hemorrhoid cream if your health care provider approves.  Wear a good support bra to prevent discomfort from breast tenderness.  If you develop varicose veins: ? Wear support pantyhose or compression stockings as told by your healthcare provider. ? Elevate your feet for 15 minutes, 3-4 times a day. Prenatal care  Write down your questions. Take them to your prenatal visits.  Keep all your prenatal visits as told by your health care provider. This is important. Safety  Wear your seat belt at  all times when driving.  Make a list of emergency phone numbers, including numbers for family, friends, the hospital, and police and fire departments. General instructions  Avoid cat litter boxes and soil used by cats. These carry germs that can cause birth defects in the baby. If you have a cat, ask someone to clean the litter box for you.  Do not travel far distances unless it is absolutely necessary and only with the approval of your health care provider.  Do not use hot tubs, steam rooms, or saunas.  Do not drink alcohol.  Do not use any products that contain nicotine or tobacco, such as cigarettes and e-cigarettes. If you need help quitting, ask your health care provider.  Do not use any medicinal herbs or unprescribed drugs. These chemicals affect the formation and growth of the baby.  Do not douche or use tampons or scented sanitary pads.  Do not cross your legs for long periods of time.  To prepare for the arrival of your baby: ? Take prenatal classes to understand, practice, and ask questions about labor and delivery. ? Make a trial run to the hospital. ? Visit the hospital and tour the maternity area. ? Arrange for maternity or paternity leave through employers. ? Arrange for family and friends to take care of pets while you are in the hospital. ? Purchase a rear-facing car seat and make sure you know how to install it in your car. ? Pack your hospital bag. ? Prepare the baby's nursery. Make sure to remove all pillows and stuffed animals from the baby's crib to prevent suffocation.  Visit your dentist if you have not gone during your pregnancy. Use a soft toothbrush to brush your teeth and be gentle when you floss. Contact a health care provider if:  You are unsure if you are in labor or if your water has broken.  You become dizzy.  You have mild pelvic cramps, pelvic pressure, or nagging pain in your abdominal area.  You have lower back pain.  You have persistent  nausea, vomiting, or diarrhea.  You have an unusual or bad smelling vaginal discharge.  You have pain when you urinate. Get help right away if:  Your water breaks before 37 weeks.  You have regular contractions less than 5 minutes apart before 37 weeks.  You have a fever.  You are leaking fluid from your vagina.  You have spotting or bleeding from your vagina.  You have severe abdominal pain or cramping.  You have rapid weight loss or weight   gain.  You have shortness of breath with chest pain.  You notice sudden or extreme swelling of your face, hands, ankles, feet, or legs.  Your baby makes fewer than 10 movements in 2 hours.  You have severe headaches that do not go away when you take medicine.  You have vision changes. Summary  The third trimester is from week 28 through week 40, months 7 through 9. The third trimester is a time when the unborn baby (fetus) is growing rapidly.  During the third trimester, your discomfort may increase as you and your baby continue to gain weight. You may have abdominal, leg, and back pain, sleeping problems, and an increased need to urinate.  During the third trimester your breasts will keep growing and they will continue to become tender. A yellow fluid (colostrum) may leak from your breasts. This is the first milk you are producing for your baby.  False labor is a condition in which you feel small, irregular tightenings of the muscles in the womb (contractions) that eventually go away. These are called Braxton Hicks contractions. Contractions may last for hours, days, or even weeks before true labor sets in.  Signs of labor can include: abdominal cramps; regular contractions that start at 10 minutes apart and become stronger and more frequent with time; watery or bloody mucus discharge that comes from the vagina; increased pelvic pressure and dull back pain; and leaking of amniotic fluid. This information is not intended to replace advice  given to you by your health care provider. Make sure you discuss any questions you have with your health care provider. Document Released: 04/16/2001 Document Revised: 09/28/2015 Document Reviewed: 06/23/2012 [employer] Interactive Patient Education  2017 [employer]   Breastfeeding Deciding to breastfeed is one of the best choices you can make for you and your baby. A change in hormones during pregnancy causes your breast tissue to grow and increases the number and size of your milk ducts. These hormones also allow proteins, sugars, and fats from your blood supply to make breast milk in your milk-producing glands. Hormones prevent breast milk from being released before your baby is born as well as prompt milk flow after birth. Once breastfeeding has begun, thoughts of your baby, as well as his or her sucking or crying, can stimulate the release of milk from your milk-producing glands. Benefits of breastfeeding For Your Baby  Your first milk (colostrum) helps your baby's digestive system function better.  There are antibodies in your milk that help your baby fight off infections.  Your baby has a lower incidence of asthma, allergies, and sudden infant death syndrome.  The nutrients in breast milk are better for your baby than infant formulas and are designed uniquely for your baby's needs.  Breast milk improves your baby's brain development.  Your baby is less likely to develop other conditions, such as childhood obesity, asthma, or type 2 diabetes mellitus.  For You  Breastfeeding helps to create a very special bond between you and your baby.  Breastfeeding is convenient. Breast milk is always available at the correct temperature and costs nothing.  Breastfeeding helps to burn calories and helps you lose the weight gained during pregnancy.  Breastfeeding makes your uterus contract to its prepregnancy size faster and slows bleeding (lochia) after you give birth.  Breastfeeding helps  to lower your risk of developing type 2 diabetes mellitus, osteoporosis, and breast or ovarian cancer later in life.  Signs that your baby is hungry Early Signs of Hunger    Increased alertness or activity.  Stretching.  Movement of the head from side to side.  Movement of the head and opening of the mouth when the corner of the mouth or cheek is stroked (rooting).  Increased sucking sounds, smacking lips, cooing, sighing, or squeaking.  Hand-to-mouth movements.  Increased sucking of fingers or hands.  Late Signs of Hunger  Fussing.  Intermittent crying.  Extreme Signs of Hunger Signs of extreme hunger will require calming and consoling before your baby will be able to breastfeed successfully. Do not wait for the following signs of extreme hunger to occur before you initiate breastfeeding:  Restlessness.  A loud, strong cry.  Screaming.  Breastfeeding basics Breastfeeding Initiation  Find a comfortable place to sit or lie down, with your neck and back well supported.  Place a pillow or rolled up blanket under your baby to bring him or her to the level of your breast (if you are seated). Nursing pillows are specially designed to help support your arms and your baby while you breastfeed.  Make sure that your baby's abdomen is facing your abdomen.  Gently massage your breast. With your fingertips, massage from your chest wall toward your nipple in a circular motion. This encourages milk flow. You may need to continue this action during the feeding if your milk flows slowly.  Support your breast with 4 fingers underneath and your thumb above your nipple. Make sure your fingers are well away from your nipple and your baby's mouth.  Stroke your baby's lips gently with your finger or nipple.  When your baby's mouth is open wide enough, quickly bring your baby to your breast, placing your entire nipple and as much of the colored area around your nipple (areola) as possible into  your baby's mouth. ? More areola should be visible above your baby's upper lip than below the lower lip. ? Your baby's tongue should be between his or her lower gum and your breast.  Ensure that your baby's mouth is correctly positioned around your nipple (latched). Your baby's lips should create a seal on your breast and be turned out (everted).  It is common for your baby to suck about 2-3 minutes in order to start the flow of breast milk.  Latching Teaching your baby how to latch on to your breast properly is very important. An improper latch can cause nipple pain and decreased milk supply for you and poor weight gain in your baby. Also, if your baby is not latched onto your nipple properly, he or she may swallow some air during feeding. This can make your baby fussy. Burping your baby when you switch breasts during the feeding can help to get rid of the air. However, teaching your baby to latch on properly is still the best way to prevent fussiness from swallowing air while breastfeeding. Signs that your baby has successfully latched on to your nipple:  Silent tugging or silent sucking, without causing you pain.  Swallowing heard between every 3-4 sucks.  Muscle movement above and in front of his or her ears while sucking.  Signs that your baby has not successfully latched on to nipple:  Sucking sounds or smacking sounds from your baby while breastfeeding.  Nipple pain.  If you think your baby has not latched on correctly, slip your finger into the corner of your baby's mouth to break the suction and place it between your baby's gums. Attempt breastfeeding initiation again. Signs of Successful Breastfeeding Signs from your baby:  A   gradual decrease in the number of sucks or complete cessation of sucking.  Falling asleep.  Relaxation of his or her body.  Retention of a small amount of milk in his or her mouth.  Letting go of your breast by himself or herself.  Signs from  you:  Breasts that have increased in firmness, weight, and size 1-3 hours after feeding.  Breasts that are softer immediately after breastfeeding.  Increased milk volume, as well as a change in milk consistency and color by the fifth day of breastfeeding.  Nipples that are not sore, cracked, or bleeding.  Signs That Your Baby is Getting Enough Milk  Wetting at least 1-2 diapers during the first 24 hours after birth.  Wetting at least 5-6 diapers every 24 hours for the first week after birth. The urine should be clear or pale yellow by 5 days after birth.  Wetting 6-8 diapers every 24 hours as your baby continues to grow and develop.  At least 3 stools in a 24-hour period by age 5 days. The stool should be soft and yellow.  At least 3 stools in a 24-hour period by age 7 days. The stool should be seedy and yellow.  No loss of weight greater than 10% of birth weight during the first 3 days of age.  Average weight gain of 4-7 ounces (113-198 g) per week after age 4 days.  Consistent daily weight gain by age 5 days, without weight loss after the age of 2 weeks.  After a feeding, your baby may spit up a small amount. This is common. Breastfeeding frequency and duration Frequent feeding will help you make more milk and can prevent sore nipples and breast engorgement. Breastfeed when you feel the need to reduce the fullness of your breasts or when your baby shows signs of hunger. This is called "breastfeeding on demand." Avoid introducing a pacifier to your baby while you are working to establish breastfeeding (the first 4-6 weeks after your baby is born). After this time you may choose to use a pacifier. Research has shown that pacifier use during the first year of a baby's life decreases the risk of sudden infant death syndrome (SIDS). Allow your baby to feed on each breast as long as he or she wants. Breastfeed until your baby is finished feeding. When your baby unlatches or falls asleep  while feeding from the first breast, offer the second breast. Because newborns are often sleepy in the first few weeks of life, you may need to awaken your baby to get him or her to feed. Breastfeeding times will vary from baby to baby. However, the following rules can serve as a guide to help you ensure that your baby is properly fed:  Newborns (babies 4 weeks of age or younger) may breastfeed every 1-3 hours.  Newborns should not go longer than 3 hours during the day or 5 hours during the night without breastfeeding.  You should breastfeed your baby a minimum of 8 times in a 24-hour period until you begin to introduce solid foods to your baby at around 6 months of age.  Breast milk pumping Pumping and storing breast milk allows you to ensure that your baby is exclusively fed your breast milk, even at times when you are unable to breastfeed. This is especially important if you are going back to work while you are still breastfeeding or when you are not able to be present during feedings. Your lactation consultant can give you guidelines on how   long it is safe to store breast milk. A breast pump is a machine that allows you to pump milk from your breast into a sterile bottle. The pumped breast milk can then be stored in a refrigerator or freezer. Some breast pumps are operated by hand, while others use electricity. Ask your lactation consultant which type will work best for you. Breast pumps can be purchased, but some hospitals and breastfeeding support groups lease breast pumps on a monthly basis. A lactation consultant can teach you how to hand express breast milk, if you prefer not to use a pump. Caring for your breasts while you breastfeed Nipples can become dry, cracked, and sore while breastfeeding. The following recommendations can help keep your breasts moisturized and healthy:  Avoid using soap on your nipples.  Wear a supportive bra. Although not required, special nursing bras and tank  tops are designed to allow access to your breasts for breastfeeding without taking off your entire bra or top. Avoid wearing underwire-style bras or extremely tight bras.  Air dry your nipples for 3-4minutes after each feeding.  Use only cotton bra pads to absorb leaked breast milk. Leaking of breast milk between feedings is normal.  Use lanolin on your nipples after breastfeeding. Lanolin helps to maintain your skin's normal moisture barrier. If you use pure lanolin, you do not need to wash it off before feeding your baby again. Pure lanolin is not toxic to your baby. You may also hand express a few drops of breast milk and gently massage that milk into your nipples and allow the milk to air dry.  In the first few weeks after giving birth, some women experience extremely full breasts (engorgement). Engorgement can make your breasts feel heavy, warm, and tender to the touch. Engorgement peaks within 3-5 days after you give birth. The following recommendations can help ease engorgement:  Completely empty your breasts while breastfeeding or pumping. You may want to start by applying warm, moist heat (in the shower or with warm water-soaked hand towels) just before feeding or pumping. This increases circulation and helps the milk flow. If your baby does not completely empty your breasts while breastfeeding, pump any extra milk after he or she is finished.  Wear a snug bra (nursing or regular) or tank top for 1-2 days to signal your body to slightly decrease milk production.  Apply ice packs to your breasts, unless this is too uncomfortable for you.  Make sure that your baby is latched on and positioned properly while breastfeeding.  If engorgement persists after 48 hours of following these recommendations, contact your health care provider or a lactation consultant. Overall health care recommendations while breastfeeding  Eat healthy foods. Alternate between meals and snacks, eating 3 of each per  day. Because what you eat affects your breast milk, some of the foods may make your baby more irritable than usual. Avoid eating these foods if you are sure that they are negatively affecting your baby.  Drink milk, fruit juice, and water to satisfy your thirst (about 10 glasses a day).  Rest often, relax, and continue to take your prenatal vitamins to prevent fatigue, stress, and anemia.  Continue breast self-awareness checks.  Avoid chewing and smoking tobacco. Chemicals from cigarettes that pass into breast milk and exposure to secondhand smoke may harm your baby.  Avoid alcohol and drug use, including marijuana. Some medicines that may be harmful to your baby can pass through breast milk. It is important to ask your health care   provider before taking any medicine, including all over-the-counter and prescription medicine as well as vitamin and herbal supplements. It is possible to become pregnant while breastfeeding. If birth control is desired, ask your health care provider about options that will be safe for your baby. Contact a health care provider if:  You feel like you want to stop breastfeeding or have become frustrated with breastfeeding.  You have painful breasts or nipples.  Your nipples are cracked or bleeding.  Your breasts are red, tender, or warm.  You have a swollen area on either breast.  You have a fever or chills.  You have nausea or vomiting.  You have drainage other than breast milk from your nipples.  Your breasts do not become full before feedings by the fifth day after you give birth.  You feel sad and depressed.  Your baby is too sleepy to eat well.  Your baby is having trouble sleeping.  Your baby is wetting less than 3 diapers in a 24-hour period.  Your baby has less than 3 stools in a 24-hour period.  Your baby's skin or the white part of his or her eyes becomes yellow.  Your baby is not gaining weight by 5 days of age. Get help right away  if:  Your baby is overly tired (lethargic) and does not want to wake up and feed.  Your baby develops an unexplained fever. This information is not intended to replace advice given to you by your health care provider. Make sure you discuss any questions you have with your health care provider. Document Released: 04/22/2005 Document Revised: 10/04/2015 Document Reviewed: 10/14/2012 Elsevier Interactive Patient Education  2017 Elsevier Inc.  

## 2016-09-12 NOTE — Progress Notes (Signed)
    PRENATAL VISIT NOTE  Subjective:  Debra Mills is a 31 y.o. G2P1001 at [redacted]w[redacted]d being seen today for ongoing prenatal care.  She is currently monitored for the following issues for this high-risk pregnancy and has Supervision of high risk pregnancy, antepartum, third trimester; Previous cesarean section complicating pregnancy, antepartum condition or complication; Insufficient prenatal care; HSV-2 infection complicating pregnancy, second trimester; Gestational diabetes, diet controlled; and Enlarged thyroid gland on her problem list.  Patient reports difficulty sleeping and diffuse abdominal pain with fetal movement..  Contractions: Irritability.  .  Movement: Present. Denies leaking of fluid.   The following portions of the patient's history were reviewed and updated as appropriate: allergies, current medications, past family history, past medical history, past social history, past surgical history and problem list. Problem list updated.  Objective:   Vitals:   09/12/16 1429  BP: 108/65  Pulse: 93  Weight: 185 lb 6.4 oz (84.1 kg)    Fetal Status:     Movement: Present     General:  Alert, oriented and cooperative. Patient is in no acute distress.  Skin: Skin is warm and dry. No rash noted.   Cardiovascular: Normal heart rate noted  Respiratory: Normal respiratory effort, no problems with respiration noted  Abdomen: Soft, gravid, appropriate for gestational age. Pain/Pressure: Present     Pelvic:  Cervical exam deferred        Extremities: Normal range of motion.  Edema: None  Mental Status: Normal mood and affect. Normal behavior. Normal judgment and thought content.  FBS 77-99 2 hr pp 96-120 Assessment and Plan:  Pregnancy: G2P1001 at [redacted]w[redacted]d  1. Supervision of high risk pregnancy, antepartum, third trimester Continue prenatal care. Reviewed normal discomforts of pregnancy   2. Previous cesarean section complicating pregnancy, antepartum condition or complication Booked for  [facility]  3. Diet controlled gestational diabetes mellitus (GDM) in third trimester BS are within range, but she is not bringing meter--please review next visit. - [place] MFM OB FOLLOW UP; Future  4. Enlarged thyroid gland **Given size (>/= 2.5 cm) and appearance, fine needle aspiration of this mildly suspicious nodule should be considered based on TI-RADS criteria.  IMPRESSION: 1. 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 Recommend FNA biopsy. u/s stated she needed to have biopsy--refer to ENT nml TFTs - Ambulatory referral to ENT  Preterm labor symptoms and general obstetric precautions including but not limited to vaginal bleeding, contractions, leaking of fluid and fetal movement were reviewed in detail with the patient. Please refer to After Visit Summary for other counseling recommendations.  Return in 2 weeks (on 09/26/2016).   Donnamae Jude, MD

## 2016-09-13 ENCOUNTER — Other Ambulatory Visit: Payer: Self-pay | Admitting: Lab

## 2016-09-13 NOTE — Progress Notes (Unsigned)
Called Dr. Jeneen Rinks.[place]  office to schedule a ENT appointment 09/12/16. Dr. Kennon Rounds stated the patient has a enlarged thyroid gland, and wants the patient to be seen. On 09/13/16 I received a call back from Dr. Riki Altes office  to fax over a referral to them so the patient can be seen.

## 2016-09-26 ENCOUNTER — Encounter: Payer: Self-pay | Admitting: General Practice

## 2016-09-26 ENCOUNTER — Ambulatory Visit (INDEPENDENT_AMBULATORY_CARE_PROVIDER_SITE_OTHER): Payer: Medicaid Other | Admitting: Obstetrics and Gynecology

## 2016-09-26 ENCOUNTER — Other Ambulatory Visit (HOSPITAL_COMMUNITY)
Admission: RE | Admit: 2016-09-26 | Discharge: 2016-09-26 | Disposition: A | Discharge status: Home / Self Care | Payer: Medicaid Other | Source: Ambulatory Visit | Attending: Obstetrics and Gynecology | Admitting: Obstetrics and Gynecology

## 2016-09-26 VITALS — BP 124/78 | HR 84 | Wt 186.6 lb

## 2016-09-26 DIAGNOSIS — O24410 Gestational diabetes mellitus in pregnancy, diet controlled: Secondary | ICD-10-CM | POA: Diagnosis not present

## 2016-09-26 DIAGNOSIS — Z3A36 36 weeks gestation of pregnancy: Secondary | ICD-10-CM | POA: Insufficient documentation

## 2016-09-26 DIAGNOSIS — O34219 Maternal care for unspecified type scar from previous cesarean delivery: Secondary | ICD-10-CM | POA: Diagnosis not present

## 2016-09-26 DIAGNOSIS — O0933 Supervision of pregnancy with insufficient antenatal care, third trimester: Secondary | ICD-10-CM

## 2016-09-26 DIAGNOSIS — O0993 Supervision of high risk pregnancy, unspecified, third trimester: Secondary | ICD-10-CM | POA: Insufficient documentation

## 2016-09-26 DIAGNOSIS — O0990 Supervision of high risk pregnancy, unspecified, unspecified trimester: Secondary | ICD-10-CM

## 2016-09-26 DIAGNOSIS — Z113 Encounter for screening for infections with a predominantly sexual mode of transmission: Secondary | ICD-10-CM | POA: Diagnosis not present

## 2016-09-26 DIAGNOSIS — E049 Nontoxic goiter, unspecified: Secondary | ICD-10-CM

## 2016-09-26 NOTE — Progress Notes (Unsigned)
Patient was scheduled for follow up OB appointment with Dr. Kennon Rounds on 10/03/16.  Patient stated that she do not want to see Dr. Kennon Rounds due to birth of her first child. Advised patient to keep appointment that is scheduled on 10/10/16 and if she has any complications to visit Maternity Admissions here at [facility].

## 2016-09-26 NOTE — Progress Notes (Signed)
   PRENATAL VISIT NOTE  Subjective:  Debra Mills is a 31 y.o. G2P1001 at [redacted]w[redacted]d being seen today for ongoing prenatal care.  She is currently monitored for the following issues for this high-risk pregnancy and has Supervision of high risk pregnancy, antepartum, third trimester; Previous cesarean section complicating pregnancy, antepartum condition or complication; Insufficient prenatal care; HSV-2 infection complicating pregnancy, second trimester; Gestational diabetes, diet controlled; and Enlarged thyroid gland on her problem list.  Patient reports no complaints.  Contractions: Irritability. Vag. Bleeding: None.  Movement: Present. Denies leaking of fluid.   The following portions of the patient's history were reviewed and updated as appropriate: allergies, current medications, past family history, past medical history, past social history, past surgical history and problem list. Problem list updated.  Objective:   Vitals:   09/26/16 1509  BP: 124/78  Pulse: 84  Weight: 186 lb 9.6 oz (84.6 kg)    Fetal Status: Fetal Heart Rate (bpm): 150 Fundal Height: 37 cm Movement: Present     General:  Alert, oriented and cooperative. Patient is in no acute distress.  Skin: Skin is warm and dry. No rash noted.   Cardiovascular: Normal heart rate noted  Respiratory: Normal respiratory effort, no problems with respiration noted  Abdomen: Soft, gravid, appropriate for gestational age. Pain/Pressure: Present     Pelvic:  Cervical exam performed Dilation: Closed Effacement (%): Thick Station: Ballotable  Extremities: Normal range of motion.  Edema: None  Mental Status: Normal mood and affect. Normal behavior. Normal judgment and thought content.   Assessment and Plan:  Pregnancy: G2P1001 at [redacted]w[redacted]d  1. Supervision of high risk pregnancy, antepartum Patient is doing well without complaints Cultures collected today - Culture, beta strep (group b only) - Cervicovaginal ancillary only  2. Diet  controlled gestational diabetes mellitus (GDM) in third trimester CBGs all within range with diet. I encouraged her to continue her efforts Follow up growth ultrasound on 5/31  3. Supervision of high risk pregnancy, antepartum, third trimester   4. Previous cesarean section complicating pregnancy, antepartum condition or complication Patient scheduled for repeat cesarean section at 39 weeks  5. Insufficient prenatal care in third trimester   6. Enlarged thyroid gland Patient opted to defer aspiration until pp. She has contact information for ENT  Preterm labor symptoms and general obstetric precautions including but not limited to vaginal bleeding, contractions, leaking of fluid and fetal movement were reviewed in detail with the patient. Please refer to After Visit Summary for other counseling recommendations.  No Follow-up on file.   Mora Bellman, MD

## 2016-09-30 LAB — CULTURE, BETA STREP (GROUP B ONLY): Strep Gp B Culture: NEGATIVE

## 2016-10-01 LAB — CERVICOVAGINAL ANCILLARY ONLY
CHLAMYDIA, DNA PROBE: NEGATIVE
Neisseria Gonorrhea: NEGATIVE

## 2016-10-03 ENCOUNTER — Encounter: Payer: Medicaid Other | Admitting: Family Medicine

## 2016-10-03 ENCOUNTER — Ambulatory Visit (HOSPITAL_COMMUNITY)
Admission: RE | Admit: 2016-10-03 | Discharge: 2016-10-03 | Disposition: A | Discharge status: Home / Self Care | Payer: Medicaid Other | Source: Ambulatory Visit | Attending: Family Medicine | Admitting: Family Medicine

## 2016-10-03 DIAGNOSIS — O24415 Gestational diabetes mellitus in pregnancy, controlled by oral hypoglycemic drugs: Secondary | ICD-10-CM | POA: Diagnosis present

## 2016-10-03 DIAGNOSIS — O24410 Gestational diabetes mellitus in pregnancy, diet controlled: Secondary | ICD-10-CM

## 2016-10-03 DIAGNOSIS — O99213 Obesity complicating pregnancy, third trimester: Secondary | ICD-10-CM | POA: Insufficient documentation

## 2016-10-03 DIAGNOSIS — O0930 Supervision of pregnancy with insufficient antenatal care, unspecified trimester: Secondary | ICD-10-CM | POA: Insufficient documentation

## 2016-10-03 DIAGNOSIS — Z3A37 37 weeks gestation of pregnancy: Secondary | ICD-10-CM | POA: Insufficient documentation

## 2016-10-03 DIAGNOSIS — Z6835 Body mass index (BMI) 35.0-35.9, adult: Secondary | ICD-10-CM | POA: Diagnosis not present

## 2016-10-03 DIAGNOSIS — O34219 Maternal care for unspecified type scar from previous cesarean delivery: Secondary | ICD-10-CM | POA: Insufficient documentation

## 2016-10-04 ENCOUNTER — Telehealth (HOSPITAL_COMMUNITY): Payer: Self-pay | Admitting: *Deleted

## 2016-10-04 NOTE — Pre-Procedure Instructions (Signed)
Interpreter number [phone]

## 2016-10-04 NOTE — Telephone Encounter (Signed)
Preadmission screen  

## 2016-10-07 ENCOUNTER — Telehealth (HOSPITAL_COMMUNITY): Payer: Self-pay | Admitting: *Deleted

## 2016-10-07 NOTE — Telephone Encounter (Signed)
Preadmission screen  

## 2016-10-07 NOTE — Pre-Procedure Instructions (Signed)
[phone] interpreter number

## 2016-10-08 ENCOUNTER — Encounter (HOSPITAL_COMMUNITY): Payer: Self-pay

## 2016-10-08 NOTE — Pre-Procedure Instructions (Signed)
Interpreter number [phone]

## 2016-10-10 ENCOUNTER — Ambulatory Visit (INDEPENDENT_AMBULATORY_CARE_PROVIDER_SITE_OTHER): Payer: Medicaid Other | Admitting: Obstetrics and Gynecology

## 2016-10-10 VITALS — BP 113/69 | HR 86 | Wt 188.3 lb

## 2016-10-10 DIAGNOSIS — O34219 Maternal care for unspecified type scar from previous cesarean delivery: Secondary | ICD-10-CM

## 2016-10-10 DIAGNOSIS — O0993 Supervision of high risk pregnancy, unspecified, third trimester: Secondary | ICD-10-CM | POA: Diagnosis present

## 2016-10-10 DIAGNOSIS — E049 Nontoxic goiter, unspecified: Secondary | ICD-10-CM

## 2016-10-10 DIAGNOSIS — O24410 Gestational diabetes mellitus in pregnancy, diet controlled: Secondary | ICD-10-CM | POA: Diagnosis not present

## 2016-10-10 LAB — POCT URINALYSIS DIP (DEVICE)
Bilirubin Urine: NEGATIVE
Glucose, UA: NEGATIVE mg/dL
Hgb urine dipstick: NEGATIVE
KETONES UR: NEGATIVE mg/dL
Nitrite: NEGATIVE
PH: 6 (ref 5.0–8.0)
PROTEIN: NEGATIVE mg/dL
SPECIFIC GRAVITY, URINE: 1.01 (ref 1.005–1.030)
Urobilinogen, UA: 0.2 mg/dL (ref 0.0–1.0)

## 2016-10-10 NOTE — Progress Notes (Signed)
Bilateral edema to lower extremities R>L

## 2016-10-10 NOTE — Progress Notes (Signed)
    PRENATAL VISIT NOTE  Subjective:  Debra Mills is a 31 y.o. G2P1001 at [redacted]w[redacted]d being seen today for ongoing prenatal care.  She is currently monitored for the following issues for this high-risk pregnancy and has Supervision of high risk pregnancy, antepartum, third trimester; Previous cesarean section complicating pregnancy, antepartum condition or complication; Insufficient prenatal care; HSV-2 infection complicating pregnancy, second trimester; Gestational diabetes, diet controlled; and Enlarged thyroid gland on her problem list.  Patient reports bilateral lower extremity swelling which improves with elevation.  Contractions: Irritability. Vag. Bleeding: None.  Movement: Present. Denies leaking of fluid.   The following portions of the patient's history were reviewed and updated as appropriate: allergies, current medications, past family history, past medical history, past social history, past surgical history and problem list. Problem list updated.  Objective:   Vitals:   10/10/16 1459  BP: 113/69  Pulse: 86  Weight: 188 lb 4.8 oz (85.4 kg)    Fetal Status: Fetal Heart Rate (bpm): 120 Fundal Height: 39 cm Movement: Present     General:  Alert, oriented and cooperative. Patient is in no acute distress.  Skin: Skin is warm and dry. No rash noted.   Cardiovascular: Normal heart rate noted  Respiratory: Normal respiratory effort, no problems with respiration noted  Abdomen: Soft, gravid, appropriate for gestational age. Pain/Pressure: Present     Pelvic:  Cervical exam deferred        Extremities: Normal range of motion.  Edema: Moderate pitting, indentation subsides rapidly, equal in size, non tender, no palpable cord bilaterally  Mental Status: Normal mood and affect. Normal behavior. Normal judgment and thought content.   Assessment and Plan:  Pregnancy: G2P1001 at [redacted]w[redacted]d  1. Supervision of high risk pregnancy, antepartum, third trimester Patient is doing well without  complaints  2. Diet controlled gestational diabetes mellitus (GDM) in third trimester CBGs reviewed and all values within range, diet control 5/31 EFW 3782 gm (>90%tile). Patient with previous c-section due to failure to descent of an [place] infant (GDM with that pregnancy as well)  3. Enlarged thyroid gland Patient plans to follow up postpartum for biopsy  4. Previous cesarean section complicating pregnancy, antepartum condition or complication Patient scheduled for repeat c-section on 6/10  Term labor symptoms and general obstetric precautions including but not limited to vaginal bleeding, contractions, leaking of fluid and fetal movement were reviewed in detail with the patient. Please refer to After Visit Summary for other counseling recommendations.  Return in about 6 weeks (around 11/21/2016) for postpartum visit.   Mora Bellman, MD

## 2016-10-11 ENCOUNTER — Encounter (HOSPITAL_COMMUNITY)
Admission: RE | Admit: 2016-10-11 | Discharge: 2016-10-11 | Disposition: A | Discharge status: Home / Self Care | Payer: Medicaid Other | Source: Ambulatory Visit | Attending: Obstetrics & Gynecology | Admitting: Obstetrics & Gynecology

## 2016-10-11 LAB — CBC
HCT: 31 % — ABNORMAL LOW (ref 36.0–46.0)
Hemoglobin: 10 g/dL — ABNORMAL LOW (ref 12.0–15.0)
MCH: 22.7 pg — ABNORMAL LOW (ref 26.0–34.0)
MCHC: 32.3 g/dL (ref 30.0–36.0)
MCV: 70.3 fL — AB (ref 78.0–100.0)
PLATELETS: 155 10*3/uL (ref 150–400)
RBC: 4.41 MIL/uL (ref 3.87–5.11)
RDW: 14.6 % (ref 11.5–15.5)
WBC: 11.2 10*3/uL — ABNORMAL HIGH (ref 4.0–10.5)

## 2016-10-11 LAB — TYPE AND SCREEN
ABO/RH(D): B POS
ANTIBODY SCREEN: NEGATIVE

## 2016-10-11 NOTE — Patient Instructions (Signed)
Debra Mills  10/11/2016   Your procedure is scheduled on:  10/12/2016  Enter through the Main Entrance of [facility] at [place] up the phone at the desk and dial [phone].   Call this number if you have problems the morning of surgery: [phone]   Remember:   Do not eat food:After Midnight.  Do not drink clear liquids: After Midnight.  Take these medicines the morning of surgery with A SIP OF WATER: none   Do not wear jewelry, make-up or nail polish.  Do not wear lotions, powders, or perfumes. Do not wear deodorant.  Do not shave 48 hours prior to surgery.  Do not bring valuables to the hospital.  [facility] is not   responsible for any belongings or valuables brought to the hospital.  Contacts, dentures or bridgework may not be worn into surgery.  Leave suitcase in the car. After surgery it may be brought to your room.  For patients admitted to the hospital, checkout time is 11:00 AM the day of              discharge.   Patients discharged the day of surgery will not be allowed to drive             home.  Name and phone number of your driver: none  Special Instructions:   N/A   Please read over the following fact sheets that you were given:   Surgical Site Infection Prevention

## 2016-10-12 ENCOUNTER — Encounter (HOSPITAL_COMMUNITY): Payer: Self-pay

## 2016-10-12 ENCOUNTER — Inpatient Hospital Stay (HOSPITAL_COMMUNITY)
Admission: RE | Admit: 2016-10-12 | Discharge: 2016-10-15 | DRG: 766 | Disposition: A | Discharge status: Home / Self Care | Payer: Medicaid Other | Source: Ambulatory Visit | Attending: Obstetrics & Gynecology | Admitting: Obstetrics & Gynecology

## 2016-10-12 ENCOUNTER — Encounter (HOSPITAL_COMMUNITY)
Admission: RE | Disposition: A | Discharge status: Home / Self Care | Payer: Self-pay | Source: Ambulatory Visit | Attending: Obstetrics & Gynecology

## 2016-10-12 ENCOUNTER — Inpatient Hospital Stay (HOSPITAL_COMMUNITY): Payer: Medicaid Other | Admitting: Certified Registered Nurse Anesthetist

## 2016-10-12 DIAGNOSIS — O34211 Maternal care for low transverse scar from previous cesarean delivery: Principal | ICD-10-CM | POA: Diagnosis present

## 2016-10-12 DIAGNOSIS — O24420 Gestational diabetes mellitus in childbirth, diet controlled: Secondary | ICD-10-CM | POA: Diagnosis present

## 2016-10-12 DIAGNOSIS — Z98891 History of uterine scar from previous surgery: Secondary | ICD-10-CM

## 2016-10-12 DIAGNOSIS — Z3A39 39 weeks gestation of pregnancy: Secondary | ICD-10-CM

## 2016-10-12 DIAGNOSIS — O34219 Maternal care for unspecified type scar from previous cesarean delivery: Secondary | ICD-10-CM | POA: Diagnosis not present

## 2016-10-12 LAB — CBC
HCT: 32.3 % — ABNORMAL LOW (ref 36.0–46.0)
HEMOGLOBIN: 10.3 g/dL — AB (ref 12.0–15.0)
MCH: 22.3 pg — AB (ref 26.0–34.0)
MCHC: 31.9 g/dL (ref 30.0–36.0)
MCV: 69.9 fL — ABNORMAL LOW (ref 78.0–100.0)
PLATELETS: 170 10*3/uL (ref 150–400)
RBC: 4.62 MIL/uL (ref 3.87–5.11)
RDW: 14.7 % (ref 11.5–15.5)
WBC: 11.3 10*3/uL — ABNORMAL HIGH (ref 4.0–10.5)

## 2016-10-12 LAB — GLUCOSE, CAPILLARY: Glucose-Capillary: 76 mg/dL (ref 65–99)

## 2016-10-12 LAB — RPR: RPR Ser Ql: NONREACTIVE

## 2016-10-12 SURGERY — Surgical Case
Anesthesia: Spinal | Site: Abdomen

## 2016-10-12 MED ORDER — PHENYLEPHRINE 8 MG IN D5W 100 ML (0.08MG/ML) PREMIX OPTIME
INJECTION | INTRAVENOUS | Status: AC
  Filled 2016-10-12: qty 100

## 2016-10-12 MED ORDER — BUPIVACAINE IN DEXTROSE 0.75-8.25 % IT SOLN
INTRATHECAL | Status: AC
  Filled 2016-10-12: qty 2

## 2016-10-12 MED ORDER — LACTATED RINGERS IV SOLN
INTRAVENOUS | Status: DC
  Administered 2016-10-12 (×3): via INTRAVENOUS

## 2016-10-12 MED ORDER — SOD CITRATE-CITRIC ACID 500-334 MG/5ML PO SOLN
30.0000 mL | Freq: Once | ORAL | Status: AC
  Administered 2016-10-12: 30 mL via ORAL

## 2016-10-12 MED ORDER — CEFAZOLIN SODIUM-DEXTROSE 2-4 GM/100ML-% IV SOLN
2.0000 g | INTRAVENOUS | Status: DC
Start: 2016-10-12 — End: 2016-10-12

## 2016-10-12 MED ORDER — OXYTOCIN 40 UNITS IN LACTATED RINGERS INFUSION - SIMPLE MED: 2.5000 [IU]/h | INTRAVENOUS | Status: AC

## 2016-10-12 MED ORDER — SENNOSIDES-DOCUSATE SODIUM 8.6-50 MG PO TABS
2.0000 | ORAL_TABLET | ORAL | Status: DC
  Administered 2016-10-13 – 2016-10-14 (×3): 2 via ORAL
  Filled 2016-10-12 (×5): qty 2

## 2016-10-12 MED ORDER — NALBUPHINE HCL 10 MG/ML IJ SOLN: 5.0000 mg | Freq: Once | INTRAMUSCULAR | Status: DC | PRN

## 2016-10-12 MED ORDER — DIPHENHYDRAMINE HCL 25 MG PO CAPS
25.0000 mg | ORAL_CAPSULE | Freq: Four times a day (QID) | ORAL | Status: DC | PRN
  Filled 2016-10-12: qty 1

## 2016-10-12 MED ORDER — LACTATED RINGERS IV SOLN
INTRAVENOUS | Status: DC
  Administered 2016-10-12 – 2016-10-13 (×3): via INTRAVENOUS

## 2016-10-12 MED ORDER — KETOROLAC TROMETHAMINE 30 MG/ML IJ SOLN
INTRAMUSCULAR | Status: AC
  Filled 2016-10-12: qty 1

## 2016-10-12 MED ORDER — MORPHINE SULFATE (PF) 0.5 MG/ML IJ SOLN
INTRAMUSCULAR | Status: AC
  Filled 2016-10-12: qty 10

## 2016-10-12 MED ORDER — SODIUM CHLORIDE 0.9 % IR SOLN
Status: DC | PRN
  Administered 2016-10-12: 1000 mL

## 2016-10-12 MED ORDER — PHENYLEPHRINE 8 MG IN D5W 100 ML (0.08MG/ML) PREMIX OPTIME
INJECTION | INTRAVENOUS | Status: DC | PRN
  Administered 2016-10-12: 60 ug/min via INTRAVENOUS

## 2016-10-12 MED ORDER — IBUPROFEN 600 MG PO TABS
600.0000 mg | ORAL_TABLET | Freq: Four times a day (QID) | ORAL | Status: DC
  Administered 2016-10-13 – 2016-10-15 (×10): 600 mg via ORAL
  Filled 2016-10-12 (×11): qty 1

## 2016-10-12 MED ORDER — KETOROLAC TROMETHAMINE 30 MG/ML IJ SOLN
30.0000 mg | Freq: Four times a day (QID) | INTRAMUSCULAR | Status: AC | PRN
  Administered 2016-10-12: 30 mg via INTRAMUSCULAR

## 2016-10-12 MED ORDER — OXYTOCIN 10 UNIT/ML IJ SOLN
INTRAMUSCULAR | Status: AC
  Filled 2016-10-12: qty 4

## 2016-10-12 MED ORDER — FENTANYL CITRATE (PF) 100 MCG/2ML IJ SOLN
INTRAMUSCULAR | Status: AC
  Filled 2016-10-12: qty 2

## 2016-10-12 MED ORDER — CEFAZOLIN SODIUM-DEXTROSE 2-4 GM/100ML-% IV SOLN: 2.0000 g | INTRAVENOUS | Status: DC

## 2016-10-12 MED ORDER — NALOXONE HCL 0.4 MG/ML IJ SOLN: 0.4000 mg | INTRAMUSCULAR | Status: DC | PRN

## 2016-10-12 MED ORDER — WITCH HAZEL-GLYCERIN EX PADS: 1.0000 "application " | MEDICATED_PAD | CUTANEOUS | Status: DC | PRN

## 2016-10-12 MED ORDER — KETOROLAC TROMETHAMINE 30 MG/ML IJ SOLN: 30.0000 mg | Freq: Four times a day (QID) | INTRAMUSCULAR | Status: AC | PRN

## 2016-10-12 MED ORDER — ONDANSETRON HCL 4 MG/2ML IJ SOLN
INTRAMUSCULAR | Status: DC | PRN
  Administered 2016-10-12: 4 mg via INTRAVENOUS

## 2016-10-12 MED ORDER — MEPERIDINE HCL 25 MG/ML IJ SOLN: 6.2500 mg | INTRAMUSCULAR | Status: DC | PRN

## 2016-10-12 MED ORDER — MORPHINE SULFATE (PF) 0.5 MG/ML IJ SOLN
INTRAMUSCULAR | Status: DC | PRN
  Administered 2016-10-12 (×2): .5 mg via EPIDURAL
  Administered 2016-10-12: .8 mg via EPIDURAL
  Administered 2016-10-12: .2 mg via EPIDURAL
  Administered 2016-10-12: .5 mg via EPIDURAL

## 2016-10-12 MED ORDER — DIBUCAINE 1 % RE OINT: 1.0000 "application " | TOPICAL_OINTMENT | RECTAL | Status: DC | PRN

## 2016-10-12 MED ORDER — KETOROLAC TROMETHAMINE 30 MG/ML IJ SOLN: 30.0000 mg | Freq: Once | INTRAMUSCULAR | Status: DC | PRN

## 2016-10-12 MED ORDER — OXYCODONE HCL 5 MG PO TABS
5.0000 mg | ORAL_TABLET | ORAL | Status: DC | PRN
  Administered 2016-10-14 (×4): 5 mg via ORAL
  Filled 2016-10-12 (×4): qty 1

## 2016-10-12 MED ORDER — ACETAMINOPHEN 325 MG PO TABS
650.0000 mg | ORAL_TABLET | ORAL | Status: DC | PRN
Start: 2016-10-12 — End: 2016-10-15
  Administered 2016-10-12 – 2016-10-14 (×5): 650 mg via ORAL
  Filled 2016-10-12 (×5): qty 2

## 2016-10-12 MED ORDER — DIPHENHYDRAMINE HCL 25 MG PO CAPS
25.0000 mg | ORAL_CAPSULE | ORAL | Status: DC | PRN
  Filled 2016-10-12: qty 1

## 2016-10-12 MED ORDER — CEFAZOLIN SODIUM-DEXTROSE 2-3 GM-% IV SOLR
INTRAVENOUS | Status: DC | PRN
  Administered 2016-10-12: 2 g via INTRAVENOUS

## 2016-10-12 MED ORDER — SIMETHICONE 80 MG PO CHEW
80.0000 mg | CHEWABLE_TABLET | ORAL | Status: DC | PRN
  Administered 2016-10-14: 80 mg via ORAL
  Filled 2016-10-12 (×2): qty 1

## 2016-10-12 MED ORDER — TETANUS-DIPHTH-ACELL PERTUSSIS 5-2.5-18.5 LF-MCG/0.5 IM SUSP: 0.5000 mL | Freq: Once | INTRAMUSCULAR | Status: DC

## 2016-10-12 MED ORDER — COCONUT OIL OIL: 1.0000 "application " | TOPICAL_OIL | Status: DC | PRN

## 2016-10-12 MED ORDER — SIMETHICONE 80 MG PO CHEW
80.0000 mg | CHEWABLE_TABLET | Freq: Three times a day (TID) | ORAL | Status: DC
  Administered 2016-10-12 – 2016-10-15 (×5): 80 mg via ORAL
  Filled 2016-10-12 (×4): qty 1

## 2016-10-12 MED ORDER — SIMETHICONE 80 MG PO CHEW
80.0000 mg | CHEWABLE_TABLET | ORAL | Status: DC
  Administered 2016-10-13 – 2016-10-14 (×4): 80 mg via ORAL
  Filled 2016-10-12 (×3): qty 1

## 2016-10-12 MED ORDER — ONDANSETRON HCL 4 MG/2ML IJ SOLN
INTRAMUSCULAR | Status: AC
  Filled 2016-10-12: qty 2

## 2016-10-12 MED ORDER — HYDROMORPHONE HCL 1 MG/ML IJ SOLN: 0.2500 mg | INTRAMUSCULAR | Status: DC | PRN

## 2016-10-12 MED ORDER — NALOXONE HCL 2 MG/2ML IJ SOSY
1.0000 ug/kg/h | PREFILLED_SYRINGE | INTRAVENOUS | Status: DC | PRN
  Filled 2016-10-12: qty 2

## 2016-10-12 MED ORDER — ONDANSETRON HCL 4 MG/2ML IJ SOLN: 4.0000 mg | Freq: Three times a day (TID) | INTRAMUSCULAR | Status: DC | PRN

## 2016-10-12 MED ORDER — ZOLPIDEM TARTRATE 5 MG PO TABS: 5.0000 mg | ORAL_TABLET | Freq: Every evening | ORAL | Status: DC | PRN

## 2016-10-12 MED ORDER — LACTATED RINGERS IV SOLN
INTRAVENOUS | Status: DC | PRN
  Administered 2016-10-12: 13:00:00 via INTRAVENOUS

## 2016-10-12 MED ORDER — DIPHENHYDRAMINE HCL 50 MG/ML IJ SOLN: 12.5000 mg | INTRAMUSCULAR | Status: DC | PRN

## 2016-10-12 MED ORDER — PROMETHAZINE HCL 25 MG/ML IJ SOLN: 6.2500 mg | INTRAMUSCULAR | Status: DC | PRN

## 2016-10-12 MED ORDER — BUPIVACAINE IN DEXTROSE 0.75-8.25 % IT SOLN
INTRATHECAL | Status: DC | PRN
  Administered 2016-10-12: 1.6 mL via INTRATHECAL

## 2016-10-12 MED ORDER — SCOPOLAMINE 1 MG/3DAYS TD PT72
MEDICATED_PATCH | TRANSDERMAL | Status: DC | PRN
  Administered 2016-10-12: 1 via TRANSDERMAL

## 2016-10-12 MED ORDER — SOD CITRATE-CITRIC ACID 500-334 MG/5ML PO SOLN
ORAL | Status: AC
  Filled 2016-10-12: qty 15

## 2016-10-12 MED ORDER — SCOPOLAMINE 1 MG/3DAYS TD PT72
MEDICATED_PATCH | TRANSDERMAL | Status: AC
  Filled 2016-10-12: qty 1

## 2016-10-12 MED ORDER — NALBUPHINE HCL 10 MG/ML IJ SOLN: 5.0000 mg | INTRAMUSCULAR | Status: DC | PRN

## 2016-10-12 MED ORDER — SODIUM CHLORIDE 0.9 % IV SOLN: INTRAVENOUS | Status: DC

## 2016-10-12 MED ORDER — SCOPOLAMINE 1 MG/3DAYS TD PT72: 1.0000 | MEDICATED_PATCH | Freq: Once | TRANSDERMAL | Status: DC

## 2016-10-12 MED ORDER — CHLOROPROCAINE HCL (PF) 3 % IJ SOLN
INTRAMUSCULAR | Status: AC
  Filled 2016-10-12: qty 20

## 2016-10-12 MED ORDER — MENTHOL 3 MG MT LOZG: 1.0000 | LOZENGE | OROMUCOSAL | Status: DC | PRN

## 2016-10-12 MED ORDER — SODIUM CHLORIDE 0.9% FLUSH: 3.0000 mL | INTRAVENOUS | Status: DC | PRN

## 2016-10-12 MED ORDER — OXYCODONE HCL 5 MG PO TABS
10.0000 mg | ORAL_TABLET | ORAL | Status: DC | PRN
  Administered 2016-10-13 – 2016-10-14 (×5): 10 mg via ORAL
  Filled 2016-10-12 (×5): qty 2

## 2016-10-12 MED ORDER — OXYTOCIN 10 UNIT/ML IJ SOLN
INTRAVENOUS | Status: DC | PRN
  Administered 2016-10-12: 40 [IU] via INTRAVENOUS

## 2016-10-12 SURGICAL SUPPLY — 35 items
BENZOIN TINCTURE PRP APPL 2/3 (GAUZE/BANDAGES/DRESSINGS) ×3 IMPLANT
CHLORAPREP W/TINT 26ML (MISCELLANEOUS) ×3 IMPLANT
CLAMP CORD UMBIL (MISCELLANEOUS) IMPLANT
CLOSURE STERI STRIP 1/2 X4 (GAUZE/BANDAGES/DRESSINGS) ×3 IMPLANT
CLOTH BEACON ORANGE TIMEOUT ST (SAFETY) ×3 IMPLANT
DRAIN JACKSON PRT FLT 7MM (DRAIN) IMPLANT
DRSG OPSITE POSTOP 4X10 (GAUZE/BANDAGES/DRESSINGS) ×3 IMPLANT
ELECT REM PT RETURN 9FT ADLT (ELECTROSURGICAL) ×3
ELECTRODE REM PT RTRN 9FT ADLT (ELECTROSURGICAL) ×1 IMPLANT
EVACUATOR SILICONE 100CC (DRAIN) IMPLANT
EXTRACTOR VACUUM M CUP 4 TUBE (SUCTIONS) IMPLANT
EXTRACTOR VACUUM M CUP 4' TUBE (SUCTIONS)
GLOVE BIO SURGEON STRL SZ7 (GLOVE) ×3 IMPLANT
GLOVE BIOGEL PI IND STRL 6.5 (GLOVE) ×2 IMPLANT
GLOVE BIOGEL PI IND STRL 7.0 (GLOVE) ×2 IMPLANT
GLOVE BIOGEL PI INDICATOR 6.5 (GLOVE) ×4
GLOVE BIOGEL PI INDICATOR 7.0 (GLOVE) ×4
GLOVE ECLIPSE 6.0 STRL STRAW (GLOVE) ×3 IMPLANT
GOWN STRL REUS W/TWL LRG LVL3 (GOWN DISPOSABLE) ×6 IMPLANT
KIT ABG SYR 3ML LUER SLIP (SYRINGE) IMPLANT
NEEDLE HYPO 25X5/8 SAFETYGLIDE (NEEDLE) ×3 IMPLANT
NS IRRIG 1000ML POUR BTL (IV SOLUTION) ×3 IMPLANT
PACK C SECTION WH (CUSTOM PROCEDURE TRAY) ×3 IMPLANT
PAD ABD 7.5X8 STRL (GAUZE/BANDAGES/DRESSINGS) ×3 IMPLANT
PAD OB MATERNITY 4.3X12.25 (PERSONAL CARE ITEMS) ×3 IMPLANT
PENCIL SMOKE EVAC W/HOLSTER (ELECTROSURGICAL) ×3 IMPLANT
RTRCTR C-SECT PINK 25CM LRG (MISCELLANEOUS) ×3 IMPLANT
SUT MON AB 2-0 SH 27 (SUTURE) ×2
SUT MON AB 2-0 SH27 (SUTURE) ×1 IMPLANT
SUT VIC AB 0 CTX 36 (SUTURE) ×10
SUT VIC AB 0 CTX36XBRD ANBCTRL (SUTURE) ×5 IMPLANT
SUT VIC AB 4-0 KS 27 (SUTURE) ×3 IMPLANT
SYR KIT LINE DRAW 1CC W/FILTR (LINER) ×3 IMPLANT
TOWEL OR 17X24 6PK STRL BLUE (TOWEL DISPOSABLE) ×3 IMPLANT
TRAY FOLEY BAG SILVER LF 14FR (SET/KITS/TRAYS/PACK) ×3 IMPLANT

## 2016-10-12 NOTE — H&P (Signed)
Debra Mills is a 31 y.o. female presenting for elective repeat cesarean section at term.  OB History    Gravida Para Term Preterm AB Living   2 1 1     1    SAB TAB Ectopic Multiple Live Births         0 1     Past Medical History:  Diagnosis Date  . Gestational diabetes   . Goiter    Past Surgical History:  Procedure Laterality Date  . CESAREAN SECTION N/A 09/28/2014   Procedure: CESAREAN SECTION;  Surgeon: Guss Bunde, MD;  Location: [facility];  Service: Obstetrics;  Laterality: N/A;   Family History: family history includes Birth defects in her brother; Cancer in her father; Diabetes in her father; Kidney disease in her father; Thyroid disease in her father. Social History:  reports that she has never smoked. She has never used smokeless tobacco. She reports that she does not drink alcohol or use drugs.     Maternal Diabetes: Yes:  Diabetes Type:  Diet controlled Genetic Screening: Declined Maternal Ultrasounds/Referrals: Normal Fetal Ultrasounds or other Referrals:  None Maternal Substance Abuse:  No Significant Maternal Medications:  None Significant Maternal Lab Results:  None Other Comments:  thyroid nodule needing evaluation poswt partum  ROS History   Blood pressure 103/65, pulse 79, temperature 97.6 F (36.4 C), temperature source Oral, resp. rate 18, last menstrual period 12/27/2015, unknown if currently breastfeeding. Exam Physical Exam  Prenatal labs: ABO, Rh: --/--/B POS (06/08 1000) Antibody: NEG (06/08 1000) Rubella: 2.79 (02/21 1348) RPR: Non Reactive (06/08 1000)  HBsAg: Negative (02/21 1348)  HIV: Non Reactive (03/14 0816)  GBS:     Assessment/Plan: 31 yo female presents for Rpt C/S at term GDM-diet controlled. Nursery aware via transfer tool Breast feeding support  The risks of cesarean section discussed with the patient included but were not limited to: bleeding which may require transfusion or reoperation; infection which may require  antibiotics; injury to bowel, bladder, ureters or other surrounding organs; injury to the fetus; need for additional procedures including hysterectomy in the event of a life-threatening hemorrhage; placental abnormalities wth subsequent pregnancies, incisional problems, thromboembolic phenomenon and other postoperative/anesthesia complications. The patient concurred with the proposed plan, giving informed written consent for the procedure.      Silas Sacramento 10/12/2016, 9:11 AM

## 2016-10-12 NOTE — Anesthesia Procedure Notes (Signed)
Spinal  Patient location during procedure: OR Staffing Anesthesiologist: Nolon Nations Performed: anesthesiologist  Preanesthetic Checklist Completed: patient identified, site marked, surgical consent, pre-op evaluation, timeout performed, IV checked, risks and benefits discussed and monitors and equipment checked Spinal Block Patient position: sitting Prep: site prepped and draped and DuraPrep Patient monitoring: heart rate, continuous pulse ox and blood pressure Approach: midline Location: L3-4 Injection technique: single-shot Needle Needle type: Sprotte  Needle gauge: 24 G Needle length: 9 cm Assessment Sensory level: T6 Additional Notes Expiration date of kit checked and confirmed. Patient tolerated procedure well, without complications.

## 2016-10-12 NOTE — Transfer of Care (Signed)
Immediate Anesthesia Transfer of Care Note  Patient: Debra Mills  Procedure(s) Performed: Procedure(s): REPEAT CESAREAN SECTION (N/A)  Patient Location: PACU  Anesthesia Type:Spinal  Level of Consciousness: awake  Airway & Oxygen Therapy: Patient Spontanous Breathing  Post-op Assessment: Report given to RN  Post vital signs: Reviewed and stable  Last Vitals:  Vitals:   10/12/16 0834  BP: 103/65  Pulse: 79  Resp: 18  Temp: 36.4 C    Last Pain:  Vitals:   10/12/16 0834  TempSrc: Oral  PainSc:          Complications: No apparent anesthesia complications

## 2016-10-12 NOTE — Op Note (Signed)
Cesarean Section Operative Report  Freeda Spivey  PROCEDURE DATE: 10/12/2016  PREOPERATIVE DIAGNOSES: Intrauterine pregnancy at [redacted]w[redacted]d weeks gestation; repeat cesarean section  POSTOPERATIVE DIAGNOSES: The same  PROCEDURE: Repeat Low Transverse Cesarean Section  SURGEON:   Surgeon(s) and Role:    * Guss Bunde, MD - Primary    * Mumaw, Lauralyn Primes, [place] Fellow  ASSISTANT:  Katherine Basset, DO - OB Fellow   INDICATIONS: Debra Mills is a 31 y.o. G2P1001 at [redacted]w[redacted]d here for cesarean section secondary to the indications listed under preoperative diagnoses; please see preoperative note for further details.  The risks of cesarean section were discussed with the patient including but were not limited to: bleeding which may require transfusion or reoperation; infection which may require antibiotics; injury to bowel, bladder, ureters or other surrounding organs; injury to the fetus; need for additional procedures including hysterectomy in the event of a life-threatening hemorrhage; placental abnormalities wth subsequent pregnancies, incisional problems, thromboembolic phenomenon and other postoperative/anesthesia complications.   The patient concurred with the proposed plan, giving informed written consent for the procedure.    FINDINGS:  Viable female infant in cephalic presentation.  Apgars 8 and 9.  Clear amniotic fluid.  Intact placenta, three vessel cord.  Normal uterus, fallopian tubes and ovaries bilaterally.  ANESTHESIA: Spinal INTRAVENOUS FLUIDS: 1700 ml ESTIMATED BLOOD LOSS: 800 ml URINE OUTPUT:  300 ml SPECIMENS: Placenta sent to L&D COMPLICATIONS: None immediate  PROCEDURE IN DETAIL:  The patient preoperatively received intravenous antibiotics and had sequential compression devices applied to her lower extremities.  She was then taken to the operating room where spinal anesthesia was administered and was found to be adequate. She was then placed in a dorsal supine  position with a leftward tilt, and prepped and draped in a sterile manner.  A foley catheter was placed into her bladder and attached to constant gravity.    After an adequate timeout was performed, a Pfannenstiel skin incision was made with scalpel over her preexisting scar and carried through to the underlying layer of fascia. The fascia was incised in the midline, and this incision was extended bilaterally using the Mayo scissors.  Kocher clamps were applied to the superior aspect of the fascial incision and the underlying rectus muscles were dissected off bluntly.  A similar process was carried out on the inferior aspect of the fascial incision. The rectus muscles were separated in the midline bluntly and the peritoneum was entered bluntly. Attention was turned to the lower uterine segment where a low transverse hysterotomy was made with a scalpel and extended bilaterally bluntly.  The infant was successfully delivered, the cord was clamped and cut after one minute, and the infant was handed over to the awaiting neonatology team. Uterine massage was then administered, and the placenta delivered intact with a three-vessel cord. The uterus was then cleared of clots and debris.  The hysterotomy was closed with 0 Vicryl in a running locked fashion, and an imbricating layer was also placed with 0 Vicryl.  Figure-of-eight 2-0 Monocryl serosal stitch was placed to help with hemostasis. The fimbria of the right fallopian tube had adhesion to anterior lower uterine segment.  The filmy adhesions were taken down with sharp dissection.  Hemostasis was achieved with direct pressure and one small area away from lumen was cauterized. The pelvis was cleared of all clot and debris. Hemostasis was confirmed on all surfaces.  The peritoneum was closed with a 0 Vicryl running stitch. The fascia was then  closed using 0 Vicryl in a running fashion. The subcutaneous layer was irrigated with good hemostasis noted. The skin was closed  with a 4-0 Vicryl subcuticular stitch.   The patient tolerated the procedure well. Sponge, lap, instrument and needle counts were correct x 3.  She was taken to the recovery room in stable condition.     Disposition: PACU - hemodynamically stable.   Maternal Condition: stable    Signed: Katherine Basset, DO OB Fellow 10/12/2016 2:08 PM   Attestation of Attending Supervision of Fellow: Evaluation and management procedures were performed by the Fellow under my supervision and collaboration. I have reviewed the Fellow's note and chart, and I agree with the management and plan. Scubbed for procudure.  Silas Sacramento, MD

## 2016-10-12 NOTE — Progress Notes (Signed)
Was told by Aleen Sells, RNC, that pt's cbg was 84 in PACU.  Result did not transfer into flowsheet once machine docked. Will give this information to receiving RN in [place].

## 2016-10-12 NOTE — Progress Notes (Signed)
Mother expressing desire and previous feeding plan to both breast feed the infant and bottle feed formula to the infant.

## 2016-10-12 NOTE — Lactation Note (Signed)
This note was copied from a baby's chart. Lactation Consultation Note  Patient Name: Debra Mills [id number] Date: 10/12/2016 Reason for consult: Initial assessment   Initial consult at 5 hrs old; GA 39.0; BW 8 lbs, 10.3 oz.  Mom GDM diet controlled.  Repeat C-Section.  Mom exclusively pumped and bottled for 2 months with first child who was in NICU. Mother is from [place] but speaks some [place], understands English; no interpreter needed.   Infant has breastfed x1 (20 min) + attempt x1 (5 min); voids-1; stools-0; LS-9 by RN. Mom reports some pain initially with latching but stated it "goes away" after a few seconds.  [place] educated on transient nipple pain and encouraged mom to call for latch check with feedings. Infant asleep in crib not cueing for feeding.  Mom reports being happy that infant latched after birth. Reviewed hand expression with return demonstration and observation of colostrum appearing on nipple tip.  Encouraged mom to hand express prior to latching to start flow and after latch for breast care.    Educated on size of infant's stomach, cluster feeding, and feeding with feeding cues. Lactation brochure given and informed of hospital support group and OP services. Encouraged to call for assistance with latching as needed.    Maternal Data Has patient been taught Hand Expression?: Yes Does the patient have breastfeeding experience prior to this delivery?: Yes  Feeding Feeding Type: Breast Fed (left breast) Length of feed: 5 min  LATCH Score/Interventions Latch: Grasps breast easily, tongue down, lips flanged, rhythmical sucking.  Audible Swallowing: Spontaneous and intermittent  Type of Nipple: Everted at rest and after stimulation  Comfort (Breast/Nipple): Soft / non-tender     Hold (Positioning): Assistance needed to correctly position infant at breast and maintain latch.  LATCH Score: 9  Lactation Tools Discussed/Used WIC Program: Yes   Consult  Status Consult Status: Follow-up Date: 10/13/16 Follow-up type: In-patient    [name] 10/12/2016, 6:23 PM

## 2016-10-12 NOTE — Anesthesia Preprocedure Evaluation (Addendum)
Anesthesia Evaluation  Patient identified by MRN, date of birth, ID band Patient awake    Reviewed: Allergy & Precautions, H&P , NPO status , Patient's Chart, lab work & pertinent test results  Airway Mallampati: II  TM Distance: >3 FB Neck ROM: full    Dental   Pulmonary neg pulmonary ROS,    breath sounds clear to auscultation       Cardiovascular negative cardio ROS   Rhythm:regular Rate:Normal     Neuro/Psych negative neurological ROS  negative psych ROS   GI/Hepatic negative GI ROS, Neg liver ROS,   Endo/Other  diabetes, Well Controlled, GestationalObesity  Renal/GU negative Renal ROS     Musculoskeletal   Abdominal (+) + obese,   Peds  Hematology negative hematology ROS (+)   Anesthesia Other Findings   Reproductive/Obstetrics (+) Pregnancy IUP term Arrest of descent                             Anesthesia Physical  Anesthesia Plan  ASA: II  Anesthesia Plan: Spinal   Post-op Pain Management:    Induction:   PONV Risk Score and Plan: 2 and Ondansetron, Scopolamine patch - Pre-op and Treatment may vary due to age or medical condition  Airway Management Planned: Natural Airway  Additional Equipment:   Intra-op Plan:   Post-operative Plan:   Informed Consent: I have reviewed the patients History and Physical, chart, labs and discussed the procedure including the risks, benefits and alternatives for the proposed anesthesia with the patient or authorized representative who has indicated his/her understanding and acceptance.     Plan Discussed with: CRNA  Anesthesia Plan Comments:        Anesthesia Quick Evaluation                                  Anesthesia Evaluation  Patient identified by MRN, date of birth, ID band Patient awake    Reviewed: Allergy & Precautions, H&P , NPO status , Patient's Chart, lab work & pertinent test results  Airway Mallampati:  II  TM Distance: >3 FB Neck ROM: full    Dental   Pulmonary neg pulmonary ROS,  breath sounds clear to auscultation        Cardiovascular negative cardio ROS  Rhythm:regular Rate:Normal     Neuro/Psych negative neurological ROS  negative psych ROS   GI/Hepatic negative GI ROS, Neg liver ROS,   Endo/Other  diabetes  Renal/GU negative Renal ROS     Musculoskeletal   Abdominal   Peds  Hematology   Anesthesia Other Findings   Reproductive/Obstetrics                             Anesthesia Physical Anesthesia Plan  ASA: II  Anesthesia Plan: Epidural   Post-op Pain Management:    Induction:   Airway Management Planned:   Additional Equipment:   Intra-op Plan:   Post-operative Plan:   Informed Consent: I have reviewed the patients History and Physical, chart, labs and discussed the procedure including the risks, benefits and alternatives for the proposed anesthesia with the patient or authorized representative who has indicated his/her understanding and acceptance.   Dental Advisory Given  Plan Discussed with: Anesthesiologist  Anesthesia Plan Comments:         Anesthesia Quick Evaluation

## 2016-10-13 ENCOUNTER — Encounter (HOSPITAL_COMMUNITY): Payer: Self-pay | Admitting: Obstetrics & Gynecology

## 2016-10-13 LAB — CBC
HEMATOCRIT: 27.9 % — AB (ref 36.0–46.0)
HEMOGLOBIN: 9.2 g/dL — AB (ref 12.0–15.0)
MCH: 23.4 pg — AB (ref 26.0–34.0)
MCHC: 33 g/dL (ref 30.0–36.0)
MCV: 70.8 fL — AB (ref 78.0–100.0)
Platelets: 139 10*3/uL — ABNORMAL LOW (ref 150–400)
RBC: 3.94 MIL/uL (ref 3.87–5.11)
RDW: 14.8 % (ref 11.5–15.5)
WBC: 10.6 10*3/uL — AB (ref 4.0–10.5)

## 2016-10-13 NOTE — Anesthesia Postprocedure Evaluation (Signed)
Anesthesia Post Note  Patient: [place]  Procedure(s) Performed: Procedure(s) (LRB): REPEAT CESAREAN SECTION (N/A)     Patient location during evaluation: Mother Baby Anesthesia Type: Spinal Level of consciousness: awake and alert and oriented Pain management: pain level controlled Vital Signs Assessment: post-procedure vital signs reviewed and stable Respiratory status: spontaneous breathing and nonlabored ventilation Cardiovascular status: stable Postop Assessment: no headache, patient able to bend at knees, no backache, no signs of nausea or vomiting, spinal receding and adequate PO intake Anesthetic complications: no    Last Vitals:  Vitals:   10/13/16 0102 10/13/16 0527  BP:  (!) 98/51  Pulse:  84  Resp: 18 17  Temp:  36.7 C    Last Pain:  Vitals:   10/13/16 0527  TempSrc: Oral  PainSc: 1    Pain Goal: Patients Stated Pain Goal: 4 (10/12/16 1928)               Jabier Mutton

## 2016-10-13 NOTE — Plan of Care (Signed)
Problem: Pain Managment: Goal: General experience of comfort will improve Outcome: Progressing Patient has been reluctant to ambulate and move about in the bed without strong encouragement or direction due to complaint of severe pain when getting OOB.  We have agreed to keep incisional pain optimally managed by taking oxycodone and acetominophen as ordered and needed every four hours and ibuprofen as ordered. Emotional support offered and realistic pain expectations reviewed. Patient also encouraged to empty bladder at 2-3 hour intervals and ambulate in room after getting up to the bathroom.

## 2016-10-13 NOTE — Progress Notes (Signed)
Post Partum Day 1  Subjective:  Debra Mills is a 31 y.o. G2P1001 [redacted]w[redacted]d s/p RLTCS.  No acute events overnight.  Pt has ambulated once and denies problems with PO intake. She still has her catheter in place. She denies nausea or vomiting.  Pain is moderately controlled.  She has not had flatus. She has not had bowel movement.  Lochia moderate.  Plan for birth control is still undecided.  Method of Feeding: breast and bottle.   Objective: BP 105/63 (BP Location: Right Arm)   Pulse (!) 56   Temp 97.9 F (36.6 C) (Oral)   Resp 18   LMP 12/27/2015   SpO2 99%   Physical Exam:  General: alert, cooperative and no distress Chest: CTAB Heart: RRR no m/r/g Abdomen: soft, nontender; fundus firm below umbilicus DVT Evaluation: No evidence of DVT seen on physical exam. Extremities: no edema noted  Recent Labs  10/11/16 1000 10/12/16 0810  HGB 10.0* 10.3*  HCT 31.0* 32.3*    Assessment/Plan:  ASSESSMENT: Debra Mills is a 31 y.o. G2P1001 [redacted]w[redacted]d ppd #1 s/p RLTCS, doing well. Continue routine care; plan for discharge in 1-2 days.    LOS: 1 day   Benson Setting 10/13/2016, 5:25 AM

## 2016-10-13 NOTE — Lactation Note (Signed)
This note was copied from a baby's chart. Lactation Consultation Note  Patient Name: Debra Mills [id number] Date: 10/13/2016 Reason for consult: Follow-up assessment  Follow up visit at 33 hours of age.  Mom reports feedings are going well.  Mom reports she is giving formula because she doesn't have enough milk for baby.  Mom reports she only has yellow milk for baby, Lc explained as normal and ok for baby.  LC offered to assist with latching, mom declines.  Mom denies concerns at this time.     Maternal Data    Feeding Feeding Type: Bottle Fed - Formula Nipple Type: Slow - flow  LATCH Score/Interventions                      Lactation Tools Discussed/Used     Consult Status Consult Status: Follow-up Date: 10/14/16 Follow-up type: In-patient    [name] 10/13/2016, 10:21 PM

## 2016-10-13 NOTE — Anesthesia Postprocedure Evaluation (Signed)
Anesthesia Post Note  Patient: Debra Mills  Procedure(s) Performed: Procedure(s) (LRB): REPEAT CESAREAN SECTION (N/A)     Patient location during evaluation: PACU Anesthesia Type: Spinal Level of consciousness: awake and alert Pain management: pain level controlled Vital Signs Assessment: post-procedure vital signs reviewed and stable Respiratory status: spontaneous breathing and respiratory function stable Cardiovascular status: blood pressure returned to baseline and stable Postop Assessment: spinal receding Anesthetic complications: no    Last Vitals:  Vitals:   10/12/16 1717 10/13/16 0102  BP: 105/63   Pulse: (!) 56   Resp: 16 18  Temp: 36.6 C     Last Pain:  Vitals:   10/13/16 0100  TempSrc:   PainSc: 9    Pain Goal: Patients Stated Pain Goal: 4 (10/12/16 1928)               Nolon Nations

## 2016-10-13 NOTE — Addendum Note (Signed)
Addendum  created 10/13/16 0827 by Hewitt Blade, CRNA   Sign clinical note

## 2016-10-14 LAB — GLUCOSE, CAPILLARY: GLUCOSE-CAPILLARY: 83 mg/dL (ref 65–99)

## 2016-10-14 MED ORDER — ONDANSETRON HCL 4 MG PO TABS
4.0000 mg | ORAL_TABLET | Freq: Three times a day (TID) | ORAL | Status: DC | PRN
  Administered 2016-10-14: 4 mg via ORAL
  Filled 2016-10-14: qty 1

## 2016-10-14 NOTE — Addendum Note (Signed)
Addendum  created 10/14/16 0936 by Asher Muir, CRNA   Charge Capture section accepted

## 2016-10-14 NOTE — Lactation Note (Signed)
This note was copied from a baby's chart. Lactation Consultation Note  Patient Name: Debra Mills [id number] Date: 10/14/2016 Reason for consult: Follow-up assessment Baby at 34 hr of life. Upon entry baby was sleeping in basinet with used bottles and multiple blankets of varying thickness. Reviewed safe sleep. Mom reports she allows baby to latch to each breast for 10-15 minutes at each feeding then offers formula. She does not think she is making enough milk. She stated if she does not limit the time at the breast the baby will stay latched "all day". Suggested she leave baby on each breast 15-20 minutes and f/u with formula as needed per hospital volume guidelines. Discussed baby behavior, feeding frequency, pumping, baby belly size, voids, wt loss, breast changes, and nipple care. Mom is aware of lactation services and support group. She will call at the next feeding for a latch check.    Maternal Data Formula Feeding for Exclusion: No  Feeding    LATCH Score/Interventions                      Lactation Tools Discussed/Used     Consult Status Consult Status: Follow-up Date: 10/15/16 Follow-up type: In-patient    Denzil Hughes 10/14/2016, 12:39 PM

## 2016-10-14 NOTE — Progress Notes (Signed)
Post OP DAY #2 Subjective: no complaints, up ad lib, voiding, tolerating PO and reports normal lochia  Objective: Blood pressure 113/64, pulse 70, temperature 97.8 F (36.6 C), temperature source Oral, resp. rate 18, last menstrual period 12/27/2015, SpO2 98 %, unknown if currently breastfeeding.  Physical Exam:  General: alert Lochia: appropriate Uterine Fundus: firm Incision: Dressing c/d/i DVT Evaluation: No evidence of DVT seen on physical exam.   Recent Labs  10/12/16 0810 10/13/16 0517  HGB 10.3* 9.2*  HCT 32.3* 27.9*    Assessment/Plan: Plan for discharge tomorrow  She wishes to stay until tomorrow   LOS: 2 days   Debra Mills 10/14/2016, 6:46 AM

## 2016-10-15 ENCOUNTER — Encounter (HOSPITAL_COMMUNITY): Payer: Self-pay | Admitting: *Deleted

## 2016-10-15 MED ORDER — IBUPROFEN 600 MG PO TABS: 600.0000 mg | ORAL_TABLET | Freq: Four times a day (QID) | ORAL | 0 refills | Status: DC

## 2016-10-15 MED ORDER — POLYETHYLENE GLYCOL 3350 17 G PO PACK: 17.0000 g | PACK | Freq: Every day | ORAL | 0 refills | Status: DC

## 2016-10-15 MED ORDER — DOCUSATE SODIUM 100 MG PO CAPS: 100.0000 mg | ORAL_CAPSULE | Freq: Two times a day (BID) | ORAL | 0 refills | Status: DC

## 2016-10-15 MED ORDER — OXYCODONE-ACETAMINOPHEN 5-325 MG PO TABS: 1.0000 | ORAL_TABLET | ORAL | 0 refills | Status: DC | PRN

## 2016-10-15 MED ORDER — POLYETHYLENE GLYCOL 3350 17 G PO PACK
17.0000 g | PACK | Freq: Every day | ORAL | Status: DC
  Administered 2016-10-15: 17 g via ORAL
  Filled 2016-10-15 (×2): qty 1

## 2016-10-15 NOTE — Plan of Care (Signed)
Problem: Education: Goal: Knowledge of condition will improve Discharge instructions, wound care and follow up reviewed with patient. Patient verbalizes understanding of information.

## 2016-10-15 NOTE — Lactation Note (Signed)
This note was copied from a baby's chart. Lactation Consultation Note  Patient Name: Debra Mills [id number] Date: 10/15/2016 Reason for consult: Follow-up assessment  Baby is 21 hours old and has been breast / formula, since 2300  Baby has been to breast since 2300.  Milk is in bilaterally with nodules LC changed a large wet and yellow stool.  Baby hungry and mom trying to latch the baby in cradle position,  And having a difficult time latching. LC offered to show mom  The foot ball position / mom consented. Baby easily latched in  Football, depth achieved and baby fed for 15 mins, multiple  Swallows, increased with breast compressions.  Sore nipple and engorgement prevention and tx reviewed.  LC instructed mom on the use hand pump and the #24 Flange  alittle snug, switched to the #27 and per mom comfortable.  Mother informed of post-discharge support and given phone number to the lactation department, including services for phone call assistance; out-patient appointments; and breastfeeding support group. List of other breastfeeding resources in the community given in the handout. Encouraged mother to call for problems or concerns related to breastfeeding.    Maternal Data Has patient been taught Hand Expression?: Yes  Feeding Feeding Type: Breast Fed Length of feed: 15 min  LATCH Score/Interventions Latch: Grasps breast easily, tongue down, lips flanged, rhythmical sucking. (right breast / football ) Intervention(s): Skin to skin;Teach feeding cues;Waking techniques Intervention(s): Adjust position;Assist with latch;Breast massage;Breast compression  Audible Swallowing: Spontaneous and intermittent  Type of Nipple: Everted at rest and after stimulation (areola semi compressible, hand express )  Comfort (Breast/Nipple): Filling, red/small blisters or bruises, mild/mod discomfort  Problem noted: Filling;Mild/Moderate discomfort  Hold (Positioning): Assistance needed to  correctly position infant at breast and maintain latch. Intervention(s): Breastfeeding basics reviewed;Support Pillows;Position options;Skin to skin  LATCH Score: 8  Lactation Tools Discussed/Used Tools: Pump;Flanges (#24 flange too snug, #27 a good fit ) Flange Size: 27 Breast pump type: Manual Pump Review: Setup, frequency, and cleaning;Milk Storage Initiated by:: MAI  Date initiated:: 10/15/16   Consult Status Consult Status: Complete Date: 10/15/16 Follow-up type: In-patient    [place] 10/15/2016, 11:38 AM

## 2016-10-15 NOTE — Discharge Instructions (Signed)

## 2016-10-15 NOTE — Discharge Summary (Signed)
OB Discharge Summary     Patient Name: Debra Mills DOB: 1986-02-06 MRN: [id number]  Date of admission: 10/12/2016 Delivering MD: Silas Sacramento H   Date of discharge: 10/15/2016  Admitting diagnosis: RCS Intrauterine pregnancy: [redacted]w[redacted]d    Secondary diagnosis:  Principal Problem:   Status post repeat low transverse cesarean section Active Problems:   Delivery with history of cesarean section  Additional problems: None     Discharge diagnosis: Term Pregnancy Delivered and GDM A1                                                                                                Post partum procedures:None  Augmentation: None  Complications: None  Hospital course:  Sceduled C/S   31y.o. yo G2P1001 at 335w0das admitted to the hospital 10/12/2016 for scheduled cesarean section with the following indication:Elective Repeat.  Membrane Rupture Time/Date: 1:12 PM ,10/12/2016   Patient delivered a Viable infant.10/12/2016  Details of operation can be found in separate operative note.  Pateint had an uncomplicated postpartum course.  She is ambulating, tolerating a regular diet, passing flatus, and urinating well. Patient is discharged home in stable condition on  10/15/16         Physical exam  Vitals:   10/14/16 0520 10/14/16 1300 10/14/16 1716 10/15/16 0558  BP: 113/64  110/63 110/67  Pulse: 70  96 81  Resp: '18  18 16  '$ Temp: 97.8 F (36.6 C) 99.3 F (37.4 C) 98.2 F (36.8 C) 98.3 F (36.8 C)  TempSrc: Oral Axillary Oral Oral  SpO2:       General: alert, cooperative and no distress Lochia: appropriate Uterine Fundus: firm Incision: Healing well with no significant drainage, No significant erythema, Dressing is clean, dry, and intact DVT Evaluation: No evidence of DVT seen on physical exam. Negative Homan's sign. No cords or calf tenderness. Labs: Lab Results  Component Value Date   WBC 10.6 (H) 10/13/2016   HGB 9.2 (L) 10/13/2016   HCT 27.9 (L) 10/13/2016   MCV 70.8 (L)  10/13/2016   PLT 139 (L) 10/13/2016   CMP Latest Ref Rng & Units 08/14/2016  Glucose 65 - 99 mg/dL 116(H)  BUN 6 - 20 mg/dL 5(L)  Creatinine 0.57 - 1.00 mg/dL 0.44(L)  Sodium 134 - 144 mmol/L 137  Potassium 3.5 - 5.2 mmol/L 3.9  Chloride 96 - 106 mmol/L 101  CO2 18 - 29 mmol/L 22  Calcium 8.7 - 10.2 mg/dL 9.6  Total Protein 6.0 - 8.5 g/dL 6.8  Total Bilirubin 0.0 - 1.2 mg/dL <0.2  Alkaline Phos 39 - 117 IU/L 163(H)  AST 0 - 40 IU/L 14  ALT 0 - 32 IU/L 9    Discharge instruction: per After Visit Summary and "Baby and Me Booklet".  After visit meds:  Allergies as of 10/15/2016      Reactions   Fish Allergy Hives      Medication List    STOP taking these medications   ACCU-CHEK NANO SMARTVIEW w/Device Kit   accu-chek soft touch lancets   aspirin 81 MG chewable tablet  folic acid 1 MG tablet Commonly known as:  FOLVITE   glucose blood test strip     TAKE these medications   docusate sodium 100 MG capsule Commonly known as:  COLACE Take 1 capsule (100 mg total) by mouth 2 (two) times daily.   ibuprofen 600 MG tablet Commonly known as:  ADVIL,MOTRIN Take 1 tablet (600 mg total) by mouth every 6 (six) hours.   oxyCODONE-acetaminophen 5-325 MG tablet Commonly known as:  ROXICET Take 1-2 tablets by mouth every 4 (four) hours as needed for severe pain.   prenatal multivitamin Tabs tablet Take 1 tablet by mouth daily at 12 noon.       Diet: carb modified diet  Activity: Advance as tolerated. Pelvic rest for 6 weeks.   Outpatient follow up:6 weeks Follow up Appt:No future appointments. Follow up Visit:No Follow-up on file.  Postpartum contraception: Progesterone only pills  Newborn Data: Live born female  Birth Weight: 8 lb 10.3 oz (3920 g) APGAR: 8, 9  Baby Feeding: Breast Disposition:home with mother   10/15/2016 Katherine Basset, DO OB Fellow

## 2016-10-29 ENCOUNTER — Telehealth: Payer: Self-pay | Admitting: General Practice

## 2016-10-29 NOTE — Telephone Encounter (Signed)
Called patient to inform her of ENT appt that has been scheduled. Patient states that she already has someone in [place] that she plans on seeing. appt cancelled with [facility]. Patient had no questions

## 2016-10-30 ENCOUNTER — Telehealth: Payer: Self-pay | Admitting: *Deleted

## 2016-10-30 NOTE — Telephone Encounter (Signed)
Per Dr. Gala Romney message needs referral for ENT for enlarged thyroid gland to be seen about 4 weeks pospartum.  I called Dr. Jennell Corner office and they do not see medicaid patients. I called [facility] and they will see her but referral must come from [place]. I called that office and gave them Dr. Tomasa Rand reccommendation that she see an ENT for enlarged thyroid gland and that Royston Bake see her with a referral from them. They state they will take care of it.

## 2016-10-30 NOTE — Telephone Encounter (Deleted)
-----   Message from Guss Bunde, MD sent at 10/22/2016  5:29 AM EDT ----- Enlarged thyroid gland (see problem list for details) ----- Message ----- From: Samuel Germany, RN Sent: 10/21/2016   1:55 PM To: Guss Bunde, MD  Dr. Gala Romney- obviously not done before she was discharged . Can you tell us what exactly the referral is for - I couldn't find a note and when we call they will want to know what they are seeing her for. Thank you Vaughan Basta ----- Message ----- From: Guss Bunde, MD Sent: 10/10/2016   3:26 PM To: [facility]  Patient needs appt with ENT about 4 weeks post partum.  Can someone make that for her so I can tell her at the time of discharge (getting c/s this Saturday)

## 2016-11-13 ENCOUNTER — Ambulatory Visit: Payer: Medicaid Other | Admitting: Advanced Practice Midwife

## 2016-11-13 ENCOUNTER — Encounter: Payer: Self-pay | Admitting: General Practice

## 2016-12-11 ENCOUNTER — Ambulatory Visit: Payer: Medicaid Other | Admitting: Advanced Practice Midwife

## 2017-02-09 ENCOUNTER — Ambulatory Visit (HOSPITAL_COMMUNITY)
Admission: EM | Admit: 2017-02-09 | Discharge: 2017-02-09 | Disposition: A | Discharge status: Home / Self Care | Payer: Medicaid Other | Attending: Internal Medicine | Admitting: Internal Medicine

## 2017-02-09 ENCOUNTER — Encounter (HOSPITAL_COMMUNITY): Payer: Self-pay | Admitting: *Deleted

## 2017-02-09 DIAGNOSIS — H60543 Acute eczematoid otitis externa, bilateral: Secondary | ICD-10-CM

## 2017-02-09 DIAGNOSIS — J069 Acute upper respiratory infection, unspecified: Secondary | ICD-10-CM

## 2017-02-09 MED ORDER — SULFAMETHOXAZOLE-TRIMETHOPRIM 800-160 MG PO TABS: 1.0000 | ORAL_TABLET | Freq: Two times a day (BID) | ORAL | 0 refills | Status: AC

## 2017-02-09 MED ORDER — NEOMYCIN-POLYMYXIN-HC 3.5-10000-1 OT SUSP: 4.0000 [drp] | OTIC | 0 refills | Status: AC

## 2017-02-09 NOTE — Discharge Instructions (Addendum)
Anticipate gradual improvement in ear pain/decreased hearing over the next several days.  Prescriptions for cortisporin otic (ear drop) and trimethoprim/sulfa (antibiotic) were sent to the pharmacy.  Please establish care with a primary care provider to help manage issues with ears in the longer term.  Recheck for new fever >100.5, increasing phlegm production/nasal discharge, or if ear pain/congestion not starting to improve in a few days.

## 2017-02-09 NOTE — ED Provider Notes (Signed)
[facility]    CSN: [id number] Arrival date & time: 02/09/17  1221     History   Chief Complaint Chief Complaint  Patient presents with  . Otalgia    HPI Debra Mills is a 31 y.o. female. She presents today with onset yesterday of severe, right greater than left, earache and sensation of congestion/decreased hearing, and some runny nose. Tactile temperature at home. Has had this difficulty in the past, usually responds to eardrops and oral medicine. Not coughing. No headache. No nausea/vomiting, no diarrhea.    HPI  Past Medical History:  Diagnosis Date  . Gestational diabetes   . Goiter     Patient Active Problem List   Diagnosis Date Noted  . Delivery with history of cesarean section 10/12/2016  . Status post repeat low transverse cesarean section 10/12/2016  . Enlarged thyroid gland 09/12/2016  . Gestational diabetes, diet controlled 07/20/2016  . 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 Supervision of high risk pregnancy, antepartum, third trimester 06/26/2016  . Previous cesarean section complicating pregnancy, antepartum condition or complication 40/98/1191  . Insufficient prenatal care 06/26/2016    Past Surgical History:  Procedure Laterality Date  . CESAREAN SECTION N/A 09/28/2014   Procedure: CESAREAN SECTION;  Surgeon: Guss Bunde, MD;  Location: [facility];  Service: Obstetrics;  Laterality: N/A;  . CESAREAN SECTION N/A 10/12/2016   Procedure: REPEAT CESAREAN SECTION;  Surgeon: Guss Bunde, MD;  Location: [facility];  Service: Obstetrics;  Laterality: N/A;    OB History    Gravida Para Term Preterm AB Living   2 1 1     1    SAB TAB Ectopic Multiple Live Births         0 1       Home Medications    Prior to Admission medications   Medication Sig Start Date End Date Taking? Authorizing Provider  docusate sodium (COLACE) 100 MG capsule Take 1 capsule (100 mg total) by mouth 2 (two) times daily.  10/15/16   Mumaw, Lauralyn Primes, DO  ibuprofen (ADVIL,MOTRIN) 600 MG tablet Take 1 tablet (600 mg total) by mouth every 6 (six) hours. 10/15/16   Mumaw, Lauralyn Primes, DO  neomycin-polymyxin-hydrocortisone (CORTISPORIN) 3.5-10000-1 OTIC suspension Place 4 drops into both ears every 4 (four) hours. 02/09/17 02/14/17  Sherlene Shams, MD  oxyCODONE-acetaminophen (ROXICET) 5-325 MG tablet Take 1-2 tablets by mouth every 4 (four) hours as needed for severe pain. 10/15/16   Mumaw, Lauralyn Primes, DO  polyethylene glycol [facility] / GLYCOLAX) packet Take 17 g by mouth daily. 10/15/16   Mumaw, Lauralyn Primes, DO  Prenatal Vit-Fe Fumarate-FA (PRENATAL MULTIVITAMIN) TABS tablet Take 1 tablet by mouth daily at 12 noon.    [provider]  sulfamethoxazole-trimethoprim (BACTRIM DS,SEPTRA DS) 800-160 MG tablet Take 1 tablet by mouth 2 (two) times daily. 02/09/17 02/14/17  Sherlene Shams, MD    Family History Family History  Problem Relation Age of Onset  . Diabetes Father   . Kidney disease Father        benign tumor removed 1 kidney  . Cancer Father   . Thyroid disease Father   . Birth defects Brother        heart defect    Social History Social History  Substance Use Topics  . Smoking status: Never Smoker  . Smokeless tobacco: Never Used  . Alcohol use No     Allergies   Fish allergy   Review of Systems Review  of Systems  All other systems reviewed and are negative.    Physical Exam Triage Vital Signs ED Triage Vitals  Enc Vitals Group     BP 02/09/17 1232 116/68     Pulse Rate 02/09/17 1232 (!) 117     Resp --      Temp 02/09/17 1232 99.1 F (37.3 C)     Temp Source 02/09/17 1232 Oral     SpO2 02/09/17 1232 100 %     Weight --      Height --      Pain Score 02/09/17 1233 4     Pain Loc --    Updated Vital Signs BP 116/68   Pulse (!) 117   Temp 99.1 F (37.3 C) (Oral)   LMP 02/08/2017 (Exact Date)   SpO2 100%   Breastfeeding? No   Physical  Exam  Constitutional: She is oriented to person, place, and time. No distress.  HENT:  Head: Atraumatic.  Bilateral ear canals quite swollen, right >> left, with scant drainage from the right ear. Pain with manipulation of both outer ears. Not able to visualize either eardrum clearly. Patient gives history of itchiness of both ears preceding this difficulty.  Eyes:  Conjugate gaze observed, no eye redness/discharge  Neck: Neck supple.  Cardiovascular: Normal rate and regular rhythm.   Pulmonary/Chest: No respiratory distress. She has no wheezes. She has no rales.  Lungs clear, symmetric breath sounds  Abdominal: She exhibits no distension.  Musculoskeletal: Normal range of motion.  Neurological: She is alert and oriented to person, place, and time.  Skin: Skin is warm and dry.  Nursing note and vitals reviewed.    UC Treatments / Results   Procedures Procedures (including critical care time) None today  Final Clinical Impressions(s) / UC Diagnoses   Final diagnoses:  Acute eczematoid otitis externa, bilateral  Acute URI   Anticipate gradual improvement in ear pain/decreased hearing over the next several days.  Prescriptions for cortisporin otic (ear drop) and trimethoprim/sulfa (antibiotic) were sent to the pharmacy.  Please establish care with a primary care provider to help manage issues with ears in the longer term.  Recheck for new fever >100.5, increasing phlegm production/nasal discharge, or if ear pain/congestion not starting to improve in a few days.     New Prescriptions New Prescriptions   NEOMYCIN-POLYMYXIN-HYDROCORTISONE (CORTISPORIN) 3.5-10000-1 OTIC SUSPENSION    Place 4 drops into both ears every 4 (four) hours.   SULFAMETHOXAZOLE-TRIMETHOPRIM (BACTRIM DS,SEPTRA DS) 800-160 MG TABLET    Take 1 tablet by mouth 2 (two) times daily.     Controlled Substance Prescriptions [place] Controlled Substance Registry consulted? No   Sherlene Shams, MD 02/10/17 1225

## 2017-02-09 NOTE — ED Triage Notes (Signed)
C/O bilat ear pain with runny nose since yesterday.  Has had tactile fever at home,  Taking Tylenol.

## 2017-02-20 ENCOUNTER — Inpatient Hospital Stay (HOSPITAL_COMMUNITY)
Admission: AD | Admit: 2017-02-20 | Discharge: 2017-02-21 | Disposition: A | Discharge status: Home / Self Care | Payer: Medicaid Other | Source: Ambulatory Visit | Attending: Obstetrics & Gynecology | Admitting: Obstetrics & Gynecology

## 2017-02-20 ENCOUNTER — Encounter (HOSPITAL_COMMUNITY): Payer: Self-pay | Admitting: *Deleted

## 2017-02-20 DIAGNOSIS — R1031 Right lower quadrant pain: Secondary | ICD-10-CM | POA: Diagnosis not present

## 2017-02-20 DIAGNOSIS — B009 Herpesviral infection, unspecified: Secondary | ICD-10-CM | POA: Diagnosis not present

## 2017-02-20 DIAGNOSIS — M5431 Sciatica, right side: Secondary | ICD-10-CM | POA: Insufficient documentation

## 2017-02-20 DIAGNOSIS — R1030 Lower abdominal pain, unspecified: Secondary | ICD-10-CM | POA: Diagnosis present

## 2017-02-20 DIAGNOSIS — Z3202 Encounter for pregnancy test, result negative: Secondary | ICD-10-CM | POA: Insufficient documentation

## 2017-02-20 LAB — URINALYSIS, ROUTINE W REFLEX MICROSCOPIC
BILIRUBIN URINE: NEGATIVE
GLUCOSE, UA: NEGATIVE mg/dL
Hgb urine dipstick: NEGATIVE
KETONES UR: NEGATIVE mg/dL
LEUKOCYTES UA: NEGATIVE
Nitrite: NEGATIVE
PH: 5 (ref 5.0–8.0)
Protein, ur: NEGATIVE mg/dL
Specific Gravity, Urine: 1.019 (ref 1.005–1.030)

## 2017-02-20 LAB — POCT PREGNANCY, URINE: Preg Test, Ur: NEGATIVE

## 2017-02-20 NOTE — MAU Provider Note (Signed)
History   [name] is a 31y/o female who presents with right sided groin pain that radiates down her right leg. This started two weeks ago and was at first intermittent but now for the past two days has become constant. She described the pain as sharp 10/10 and it radiates down her right leg to the foot with associated numbness and tingling.  She also states she thinks she has a herpes outbreak on her right thigh located at the right groin area for one day. She also endorses subjective fever for one day.  She denies chest pain, SOB, recent sickness, nausea, vomiting, no decreased appetite, no pain on urination, urinary frequency, vaginal discharge, diarrhea, or constipation.  CSN: [id number]  Arrival date and time: 02/20/17 2201   None     Chief Complaint  Patient presents with  . Pelvic Pain   HPI   Past Medical History:  Diagnosis Date  . Gestational diabetes   . Goiter     Past Surgical History:  Procedure Laterality Date  . CESAREAN SECTION N/A 09/28/2014   Procedure: CESAREAN SECTION;  Surgeon: Guss Bunde, MD;  Location: [facility];  Service: Obstetrics;  Laterality: N/A;  . CESAREAN SECTION N/A 10/12/2016   Procedure: REPEAT CESAREAN SECTION;  Surgeon: Guss Bunde, MD;  Location: [facility];  Service: Obstetrics;  Laterality: N/A;    Family History  Problem Relation Age of Onset  . Diabetes Father   . Kidney disease Father        benign tumor removed 1 kidney  . Cancer Father   . Thyroid disease Father   . Birth defects Brother        heart defect    Social History  Substance Use Topics  . Smoking status: Never Smoker  . Smokeless tobacco: Never Used  . Alcohol use No    Allergies:  Allergies  Allergen Reactions  . Fish Allergy Hives    Prescriptions Prior to Admission  Medication Sig Dispense Refill Last Dose  . docusate sodium (COLACE) 100 MG capsule Take 1 capsule (100 mg total) by mouth 2 (two) times daily. 30 capsule 0   .  ibuprofen (ADVIL,MOTRIN) 600 MG tablet Take 1 tablet (600 mg total) by mouth every 6 (six) hours. 30 tablet 0   . oxyCODONE-acetaminophen (ROXICET) 5-325 MG tablet Take 1-2 tablets by mouth every 4 (four) hours as needed for severe pain. 30 tablet 0   . polyethylene glycol (MIRALAX / GLYCOLAX) packet Take 17 g by mouth daily. 14 each 0   . Prenatal Vit-Fe Fumarate-FA (PRENATAL MULTIVITAMIN) TABS tablet Take 1 tablet by mouth daily at 12 noon.   10/11/2016 at Unknown time    Review of Systems Physical Exam   Blood pressure 105/66, pulse 85, temperature 98 F (36.7 C), temperature source Oral, resp. rate 18, height 5\' 1"  (1.549 m), weight 175 lb 8 oz (79.6 kg), last menstrual period 02/08/2017.  Physical Exam  Constitutional: She is oriented to person, place, and time. She appears well-developed and well-nourished. No distress.  HENT:  Head: Normocephalic and atraumatic.  Eyes: Pupils are equal, round, and reactive to light. EOM are normal.  Neck: Normal range of motion. Neck supple. Thyromegaly present.  Cardiovascular: Normal rate, regular rhythm and normal heart sounds.   No murmur heard. Respiratory: Breath sounds normal. She is in respiratory distress. She has no wheezes.  GI: Soft. Bowel sounds are normal. She exhibits no distension. There is tenderness. There is no rebound and  no guarding.  Genitourinary: Vagina normal. No vaginal discharge found.  Musculoskeletal: Normal range of motion. She exhibits edema.  Positive straight leg raise on the right  Neurological: She is alert and oriented to person, place, and time.  Skin: Skin is warm and dry. No erythema.  Does have three non-vesicular erythematous bumps along the underwear line on her right groin area. Non-tender to touch.  MAU Course  Procedures Pregnancy Test negative  MDM   Assessment and Plan  Right sided Sciatica - given history and positive straight leg raise this is most likely. Give Ibuprofen 600mg  and take  regularly. Can take with Tylenol. 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 RLQ pain - appendicitis unlikely given clinical presentation and afebrile with no other symptoms. Encouraged patient to have outpatient follow up if no improvement.   Debra Mills 02/20/2017, 11:30 PM   I spoke with and examined patient and agree with resident/PA/SNM's note and plan of care.  CRESENZO-DISHMAN,Akshitha Culmer  02/21/2017 8:51 AM

## 2017-02-20 NOTE — MAU Note (Signed)
Pt c/o RLQ pain with pain radiating down right leg. Pt states has Hx of HSV wondering if she needs medicine

## 2017-02-20 NOTE — MAU Note (Signed)
PT SAYS SHE HAS PAIN IN RIGHT GROIN -  RADIATES  DOWN  LEG-   STARTED   YESTERDAY.   TOOK TYLENOL AT 8PM- SOME  BETTER.   [facility]

## 2017-02-21 DIAGNOSIS — M5431 Sciatica, right side: Secondary | ICD-10-CM | POA: Diagnosis not present

## 2017-02-21 MED ORDER — IBUPROFEN 600 MG PO TABS: 600.0000 mg | ORAL_TABLET | Freq: Three times a day (TID) | ORAL | Status: DC

## 2017-02-21 MED ORDER — VALACYCLOVIR HCL 500 MG PO TABS
500.0000 mg | ORAL_TABLET | Freq: Two times a day (BID) | ORAL | Status: DC
  Filled 2017-02-21 (×2): qty 1

## 2017-02-21 MED ORDER — VALACYCLOVIR HCL 500 MG PO TABS: 500.0000 mg | ORAL_TABLET | Freq: Two times a day (BID) | ORAL | 0 refills | Status: DC

## 2017-02-21 MED ORDER — IBUPROFEN 600 MG PO TABS: 600.0000 mg | ORAL_TABLET | Freq: Three times a day (TID) | ORAL | 0 refills | Status: DC

## 2017-02-21 NOTE — Discharge Instructions (Signed)
Genital Herpes Genital herpes is a common sexually transmitted infection (STI) that is caused by a virus. The virus spreads from person to person through sexual contact. Infection can cause itching, blisters, and sores around the genitals or rectum. Symptoms may last several days and then go away This is called an outbreak. However, the virus remains in your body, so you may have more outbreaks in the future. The time between outbreaks varies and can be months or years. Genital herpes affects men and women. It is particularly concerning for pregnant women because the virus can be passed to the baby during delivery and can cause serious problems. Genital herpes is also a concern for people who have a weak disease-fighting (immune) system. What are the causes? This condition is caused by the herpes simplex virus (HSV) type 1 or type 2. The virus may spread through:  Sexual contact with an infected person, including vaginal, anal, and oral sex.  Contact with fluid from a herpes sore.  The skin. This means that you can get herpes from an infected partner even if he or she does not have a visible sore or does not know that he or she is infected. What increases the risk? You are more likely to develop this condition if:  You have sex with many partners.  You do not use latex condoms during sex. What are the signs or symptoms? Most people do not have symptoms (asymptomatic) or have mild symptoms that may be mistaken for other skin problems. Symptoms may include:  Small red bumps near the genitals, rectum, or mouth. These bumps turn into blisters and then turn into sores.  Flu-like symptoms, including:  Fever.  Body aches.  Swollen lymph nodes.  Headache.  Painful urination.  Pain and itching in the genital area or rectal area.  Vaginal discharge.  Tingling or shooting pain in the legs and buttocks. Generally, symptoms are more severe and last longer during the first (primary)  outbreak. Flu-like symptoms are also more common during the primary outbreak. How is this diagnosed? Genital herpes may be diagnosed based on:  A physical exam.  Your medical history.  Blood tests.  A test of a fluid sample (culture) from an open sore. How is this treated? There is no cure for this condition, but treatment with antiviral medicines that are taken by mouth (orally) can do the following:  Speed up healing and relieve symptoms.  Help to reduce the spread of the virus to sexual partners.  Limit the chance of future outbreaks, or make future outbreaks shorter.  Lessen symptoms of future outbreaks. Your health care provider may also recommend pain relief medicines, such as aspirin or ibuprofen. Follow these instructions at home: Sexual activity   Do not have sexual contact during active outbreaks.  Practice safe sex. Latex condoms and female condoms may help prevent the spread of the herpes virus. General instructions   Keep the affected areas dry and clean.  Take over-the-counter and prescription medicines only as told by your health care provider.  Avoid rubbing or touching blisters and sores. If you do touch blisters or sores:  Wash your hands thoroughly with soap and water.  Do not touch your eyes afterward.  To help relieve pain or itching, you may take the following actions as directed by your health care provider:  Apply a cold, wet cloth (cold compress) to affected areas 4-6 times a day.  Apply a substance that protects your skin and reduces bleeding (astringent).  Apply a   gel that helps relieve pain around sores (lidocaine gel).  Take a warm, shallow bath that cleans the genital area (sitz bath).  Keep all follow-up visits as told by your health care provider. This is important. How is this prevented?  Use condoms. Although anyone can get genital herpes during sexual contact, even with the use of a condom, a condom can provide some  protection.  Avoid having multiple sexual partners.  Talk with your sexual partner about any symptoms either of you may have. Also, talk with your partner about any history of STIs.  Get tested for STIs before you have sex. Ask your partner to do the same.  Do not have sexual contact if you have symptoms of genital herpes. Contact a health care provider if:  Your symptoms are not improving with medicine.  Your symptoms return.  You have new symptoms.  You have a fever.  You have abdominal pain.  You have redness, swelling, or pain in your eye.  You notice new sores on other parts of your body.  You are a woman and experience bleeding between menstrual periods.  You have had herpes and you become pregnant or plan to become pregnant. Summary  Genital herpes is a common sexually transmitted infection (STI) that is caused by the herpes simplex virus (HSV) type 1 or type 2.  These viruses are most often spread through sexual contact with an infected person.  You are more likely to develop this condition if you have sex with many partners or you have unprotected sex.  Most people do not have symptoms (asymptomatic) or have mild symptoms that may be mistaken for other skin problems. Symptoms occur as outbreaks that may happen months or years apart.  There is no cure for this condition, but treatment with oral antiviral medicines can reduce symptoms, reduce the chance of spreading the virus to a partner, prevent future outbreaks, or shorten future outbreaks. This information is not intended to replace advice given to you by your health care provider. Make sure you discuss any questions you have with your health care provider. Document Released: 04/19/2000 Document Revised: 03/22/2016 Document Reviewed: 03/22/2016 Elsevier Interactive Patient Education  2017 Elsevier Inc.  

## 2017-02-24 ENCOUNTER — Encounter (HOSPITAL_COMMUNITY): Payer: Self-pay | Admitting: *Deleted

## 2017-02-24 ENCOUNTER — Inpatient Hospital Stay (HOSPITAL_COMMUNITY)
Admission: AD | Admit: 2017-02-24 | Discharge: 2017-02-25 | Disposition: A | Discharge status: Home / Self Care | Payer: Medicaid Other | Source: Ambulatory Visit | Attending: Obstetrics & Gynecology | Admitting: Obstetrics & Gynecology

## 2017-02-24 ENCOUNTER — Other Ambulatory Visit: Payer: Self-pay | Admitting: Obstetrics & Gynecology

## 2017-02-24 DIAGNOSIS — M791 Myalgia, unspecified site: Secondary | ICD-10-CM

## 2017-02-24 DIAGNOSIS — M7918 Myalgia, other site: Secondary | ICD-10-CM | POA: Diagnosis not present

## 2017-02-24 DIAGNOSIS — R1031 Right lower quadrant pain: Secondary | ICD-10-CM | POA: Diagnosis not present

## 2017-02-24 DIAGNOSIS — A6000 Herpesviral infection of urogenital system, unspecified: Secondary | ICD-10-CM | POA: Diagnosis not present

## 2017-02-24 DIAGNOSIS — N898 Other specified noninflammatory disorders of vagina: Secondary | ICD-10-CM | POA: Diagnosis present

## 2017-02-24 LAB — URINALYSIS, ROUTINE W REFLEX MICROSCOPIC
Bilirubin Urine: NEGATIVE
GLUCOSE, UA: NEGATIVE mg/dL
HGB URINE DIPSTICK: NEGATIVE
Ketones, ur: NEGATIVE mg/dL
LEUKOCYTES UA: NEGATIVE
Nitrite: NEGATIVE
PH: 6 (ref 5.0–8.0)
Protein, ur: NEGATIVE mg/dL
SPECIFIC GRAVITY, URINE: 1.008 (ref 1.005–1.030)

## 2017-02-24 NOTE — Telephone Encounter (Signed)
Debra Mills Self [phone]  [facility] [place]  valACYclovir (VALTREX) 500 MG tablet  Patient called to say she is out of medicine and needs more. Please call and advise

## 2017-02-24 NOTE — MAU Note (Addendum)
Pt reports herpes outbreak that started Thursday. States she took medicine for 3 days and finished but still has the outbreak. States it looks like it is getting better, but has some right lower abdominal pain that started 2 weeks ago, but now she has a thick, brown vaginal discharge that she is concerned about. Has some burning with urination. Pt states that it itches a little. LMP: 02/08/2017.

## 2017-02-25 DIAGNOSIS — R1031 Right lower quadrant pain: Secondary | ICD-10-CM

## 2017-02-25 DIAGNOSIS — N898 Other specified noninflammatory disorders of vagina: Secondary | ICD-10-CM

## 2017-02-25 DIAGNOSIS — A6000 Herpesviral infection of urogenital system, unspecified: Secondary | ICD-10-CM

## 2017-02-25 LAB — WET PREP, GENITAL
Clue Cells Wet Prep HPF POC: NONE SEEN
Sperm: NONE SEEN
TRICH WET PREP: NONE SEEN
Yeast Wet Prep HPF POC: NONE SEEN

## 2017-02-25 LAB — GC/CHLAMYDIA PROBE AMP (~~LOC~~) NOT AT ARMC
CHLAMYDIA, DNA PROBE: NEGATIVE
NEISSERIA GONORRHEA: NEGATIVE

## 2017-02-25 MED ORDER — METHOCARBAMOL 500 MG PO TABS: 500.0000 mg | ORAL_TABLET | Freq: Four times a day (QID) | ORAL | 0 refills | Status: DC | PRN

## 2017-02-25 MED ORDER — VALACYCLOVIR HCL 500 MG PO TABS: 500.0000 mg | ORAL_TABLET | Freq: Two times a day (BID) | ORAL | 0 refills | Status: AC

## 2017-02-25 NOTE — MAU Provider Note (Signed)
History     CSN: [id number]  Arrival date and time: 02/24/17 2312   First Provider Initiated Contact with Patient 02/25/17 0008      Chief Complaint  Patient presents with  . Vaginal Discharge    HPI: Debra Mills is a 31 y.o. G2P1002 who presents to MAU with c/c persistent RLQ pain and vaginal discharge. She was seen for the same 4 days ago. Started taking ibuprofen for msk pain which has started to improved, but now having vaginal discharge. She reports pain on RLQ near her C/S scar (s/p C/S 4 months ago), this radiated down her R thigh, leg, and all the down to her foot. Also notes pain on right lower back. She also noted that she had a recent HSV outbreak, and although lesion has improved, still having burning pain in genital region, and also now having some vaginal discharge. She denies fever, chills, nausea, vomiting, diarrhea, constipation, urinary frequency, hematuria, dizziness, lightheadedness, or malaise. Last BM yesterday.    Past obstetric history: OB History  Gravida Para Term Preterm AB Living  2 1 1     2   SAB TAB Ectopic Multiple Live Births        0 2    # Outcome Date GA Lbr Len/2nd Weight Sex Delivery Anes PTL Lv  2 Term 09/28/14 [redacted]w[redacted]d 19:14 / 06:22 8 lb 1.8 oz (3.68 kg) M CS-Classical EPI  LIV  1 Gravida      CS-LTranv         Past Medical History:  Diagnosis Date  . Gestational diabetes   . Goiter     Past Surgical History:  Procedure Laterality Date  . CESAREAN SECTION N/A 09/28/2014   Procedure: CESAREAN SECTION;  Surgeon: Guss Bunde, MD;  Location: [facility];  Service: Obstetrics;  Laterality: N/A;  . CESAREAN SECTION N/A 10/12/2016   Procedure: REPEAT CESAREAN SECTION;  Surgeon: Guss Bunde, MD;  Location: [facility];  Service: Obstetrics;  Laterality: N/A;    Family History  Problem Relation Age of Onset  . Diabetes Father   . Kidney disease Father        benign tumor removed 1 kidney  . Cancer Father   . Thyroid disease Father    . Birth defects Brother        heart defect    Social History  Substance Use Topics  . Smoking status: Never Smoker  . Smokeless tobacco: Never Used  . Alcohol use No    Allergies:  Allergies  Allergen Reactions  . Fish Allergy Hives    Prescriptions Prior to Admission  Medication Sig Dispense Refill Last Dose  . acetaminophen (TYLENOL) 325 MG tablet Take 650 mg by mouth every 4 (four) hours as needed.   02/20/2017 at Unknown time  . docusate sodium (COLACE) 100 MG capsule Take 1 capsule (100 mg total) by mouth 2 (two) times daily. 30 capsule 0 More than a month at Unknown time  . ibuprofen (ADVIL,MOTRIN) 600 MG tablet Take 1 tablet (600 mg total) by mouth every 6 (six) hours. 30 tablet 0 More than a month at Unknown time  . ibuprofen (ADVIL,MOTRIN) 600 MG tablet Take 1 tablet (600 mg total) by mouth 3 (three) times daily. 30 tablet 0   . oxyCODONE-acetaminophen (ROXICET) 5-325 MG tablet Take 1-2 tablets by mouth every 4 (four) hours as needed for severe pain. 30 tablet 0 More than a month at Unknown time  . polyethylene glycol (MIRALAX / GLYCOLAX) packet Take  17 g by mouth daily. 14 each 0 More than a month at Unknown time  . Prenatal Vit-Fe Fumarate-FA (PRENATAL MULTIVITAMIN) TABS tablet Take 1 tablet by mouth daily at 12 noon.   More than a month at Unknown time  . [EXPIRED] valACYclovir (VALTREX) 500 MG tablet Take 1 tablet (500 mg total) by mouth 2 (two) times daily. 6 tablet 0     Review of Systems - Negative except for what is mentioned in HPI.  Physical Exam   Blood pressure 102/66, pulse 82, temperature 98.3 F (36.8 C), temperature source Oral, resp. rate 17, height 5\' 1"  (1.549 m), weight 177 lb (80.3 kg), last menstrual period 02/08/2017, SpO2 99 %.  Constitutional: Well-developed, well-nourished female in no acute distress.  HENT: Ava/AT, normal oropharynx mucosa. MMM Eyes: normal conjunctivae, no scleral icterus Cardiovascular: normal rate, regular rhythm, no  murmurs Respiratory: normal effort, lungs CTAB GI: Abd soft, non-tender, nondistended in all 4 quadrants GU: Neg CVAT. Pelvic: NEFG w/o visible lesions, physiologic discharge, no blood, cervix clean. No CMT, no palpable adnexal masses MSK: Extremities nontender, no edema, normal ROM. Mild paraspinal muscle tenderness on lower back Neurologic: Alert and oriented x 4. Psych: Normal mood and affect Skin: warm and dry; C/S incision scar healing well w/o erythema, or drainage.  MAU Course  Procedures  MDM Patient seen, evaluated, VS and nursing noted reviewed. Pain seems msk. Pain on right upper thigh possible meralgia parasthetica, but she also reports numbness sensation down to her foot which is not consistent. Neurological exam is normal. She has had some improvement of her pain with NSAID. Advised to continue this. Will prescribe muscle relaxant as well given paraspinal muscle tenderness and spasm. She is not breastfeeding.  HSV lesion seems healed, but still having some pain.  Wet prep unremarkable GC/Chalmydia probe sent  Assessment and Plan  Assessment: 1. Musculoskeletal pain   2. Genital herpes simplex, unspecified site     Plan: --Continue scheduled NSAID --Muscle relaxant prescribed --Valtrex rx given. Counseled on expected course of pain and with recent HSV outbreak.  --Discharge home in stable condition and return precautions.   Degele, Jenne Pane, MD 02/25/2017 12:27 AM

## 2017-02-25 NOTE — Discharge Instructions (Signed)
Genital Herpes Genital herpes is a common sexually transmitted infection (STI) that is caused by a virus. The virus spreads from person to person through sexual contact. Infection can cause itching, blisters, and sores around the genitals or rectum. Symptoms may last several days and then go away This is called an outbreak. However, the virus remains in your body, so you may have more outbreaks in the future. The time between outbreaks varies and can be months or years. Genital herpes affects men and women. It is particularly concerning for pregnant women because the virus can be passed to the baby during delivery and can cause serious problems. Genital herpes is also a concern for people who have a weak disease-fighting (immune) system. What are the causes? This condition is caused by the herpes simplex virus (HSV) type 1 or type 2. The virus may spread through:  Sexual contact with an infected person, including vaginal, anal, and oral sex.  Contact with fluid from a herpes sore.  The skin. This means that you can get herpes from an infected partner even if he or she does not have a visible sore or does not know that he or she is infected.  What increases the risk? You are more likely to develop this condition if:  You have sex with many partners.  You do not use latex condoms during sex.  What are the signs or symptoms? Most people do not have symptoms (asymptomatic) or have mild symptoms that may be mistaken for other skin problems. Symptoms may include:  Small red bumps near the genitals, rectum, or mouth. These bumps turn into blisters and then turn into sores.  Flu-like symptoms, including: ? Fever. ? Body aches. ? Swollen lymph nodes. ? Headache.  Painful urination.  Pain and itching in the genital area or rectal area.  Vaginal discharge.  Tingling or shooting pain in the legs and buttocks.  Generally, symptoms are more severe and last longer during the first (primary)  outbreak. Flu-like symptoms are also more common during the primary outbreak. How is this diagnosed? Genital herpes may be diagnosed based on:  A physical exam.  Your medical history.  Blood tests.  A test of a fluid sample (culture) from an open sore.  How is this treated? There is no cure for this condition, but treatment with antiviral medicines that are taken by mouth (orally) can do the following:  Speed up healing and relieve symptoms.  Help to reduce the spread of the virus to sexual partners.  Limit the chance of future outbreaks, or make future outbreaks shorter.  Lessen symptoms of future outbreaks.  Your health care provider may also recommend pain relief medicines, such as aspirin or ibuprofen. Follow these instructions at home: Sexual activity  Do not have sexual contact during active outbreaks.  Practice safe sex. Latex condoms and female condoms may help prevent the spread of the herpes virus. General instructions  Keep the affected areas dry and clean.  Take over-the-counter and prescription medicines only as told by your health care provider.  Avoid rubbing or touching blisters and sores. If you do touch blisters or sores: ? Wash your hands thoroughly with soap and water. ? Do not touch your eyes afterward.  To help relieve pain or itching, you may take the following actions as directed by your health care provider: ? Apply a cold, wet cloth (cold compress) to affected areas 4-6 times a day. ? Apply a substance that protects your skin and reduces bleeding (astringent). ?  Apply a gel that helps relieve pain around sores (lidocaine gel). ? Take a warm, shallow bath that cleans the genital area (sitz bath).  Keep all follow-up visits as told by your health care provider. This is important. How is this prevented?  Use condoms. Although anyone can get genital herpes during sexual contact, even with the use of a condom, a condom can provide some  protection.  Avoid having multiple sexual partners.  Talk with your sexual partner about any symptoms either of you may have. Also, talk with your partner about any history of STIs.  Get tested for STIs before you have sex. Ask your partner to do the same.  Do not have sexual contact if you have symptoms of genital herpes. Contact a health care provider if:  Your symptoms are not improving with medicine.  Your symptoms return.  You have new symptoms.  You have a fever.  You have abdominal pain.  You have redness, swelling, or pain in your eye.  You notice new sores on other parts of your body.  You are a woman and experience bleeding between menstrual periods.  You have had herpes and you become pregnant or plan to become pregnant. Summary  Genital herpes is a common sexually transmitted infection (STI) that is caused by the herpes simplex virus (HSV) type 1 or type 2.  These viruses are most often spread through sexual contact with an infected person.  You are more likely to develop this condition if you have sex with many partners or you have unprotected sex.  Most people do not have symptoms (asymptomatic) or have mild symptoms that may be mistaken for other skin problems. Symptoms occur as outbreaks that may happen months or years apart.  There is no cure for this condition, but treatment with oral antiviral medicines can reduce symptoms, reduce the chance of spreading the virus to a partner, prevent future outbreaks, or shorten future outbreaks. This information is not intended to replace advice given to you by your health care provider. Make sure you discuss any questions you have with your health care provider. Document Released: 04/19/2000 Document Revised: 03/22/2016 Document Reviewed: 03/22/2016 Elsevier Interactive Patient Education  2017 Elsevier Inc.   Musculoskeletal Pain Musculoskeletal pain is muscle and bone aches and pains. This pain can occur in any  part of the body. Follow these instructions at home:  Only take medicines for pain, discomfort, or fever as told by your health care provider.  You may continue all activities unless the activities cause more pain. When the pain lessens, slowly resume normal activities. Gradually increase the intensity and duration of the activities or exercise.  During periods of severe pain, bed rest may be helpful. Lie or sit in any position that is comfortable, but get out of bed and walk around at least every several hours.  If directed, put ice on the injured area. ? Put ice in a plastic bag. ? Place a towel between your skin and the bag. ? Leave the ice on for 20 minutes, 2-3 times a day. Contact a health care provider if:  Your pain is getting worse.  Your pain is not relieved with medicines.  You lose function in the area of the pain if the pain is in your arms, legs, or neck. This information is not intended to replace advice given to you by your health care provider. Make sure you discuss any questions you have with your health care provider. Document Released: 04/22/2005 Document Revised: 10/03/2015  Document Reviewed: 12/25/2012 Elsevier Interactive Patient Education  2017 [employer].

## 2017-02-28 MED ORDER — MELOXICAM 7.5 MG PO TABS: 7.5000 mg | ORAL_TABLET | Freq: Every day | ORAL | 0 refills | Status: DC

## 2017-02-28 NOTE — Telephone Encounter (Signed)
Patient's pharmacy called and left message stating the robaxin is back ordered and they need an alternative. Per Dr Ilda Basset, may change Rx to Mobic. med ordered. Per chart review, patient had Rx sent in for valtrex at MAU visit.

## 2017-05-13 IMAGING — US US THYROID
1 series · 13 of 25 positions shown · non-contrast
Comparison: None.

CLINICAL DATA: Shortness of breath.  Goiter on physical exam.

EXAM:
THYROID ULTRASOUND
TECHNIQUE: Ultrasound examination of the thyroid gland and adjacent soft
tissues was performed.

[Series 1: us thyroid · 0.13mm/px · 13 of 88 slices shown]
[im 1/88]
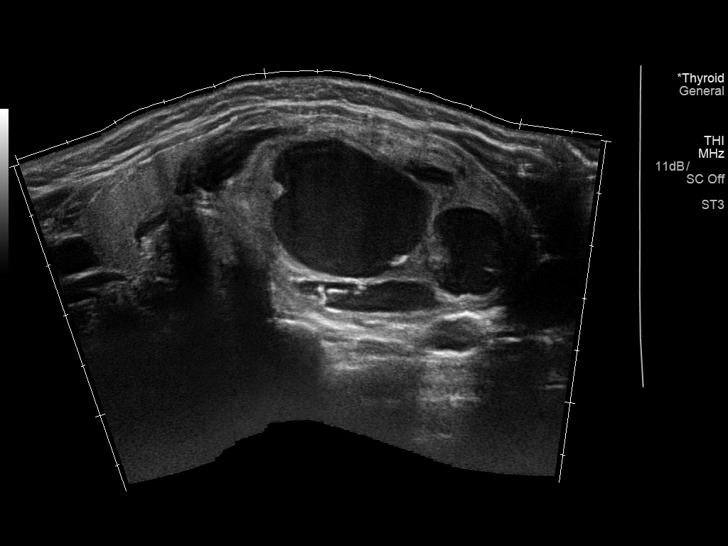
[im 8/88]
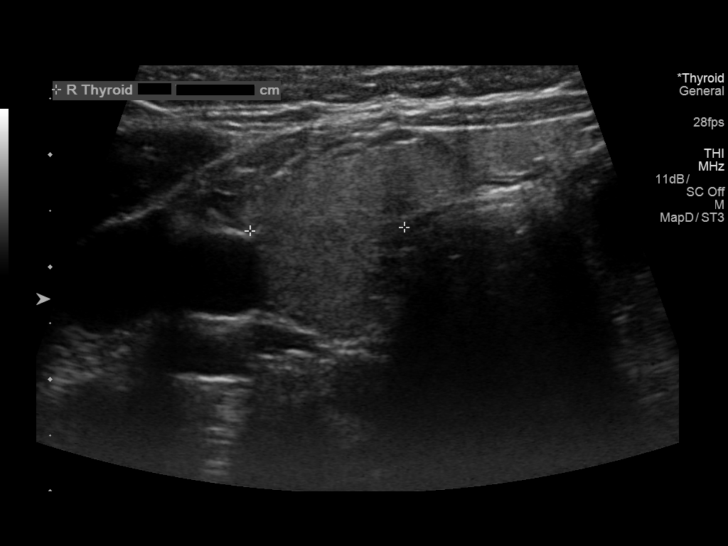
[im 15/88]
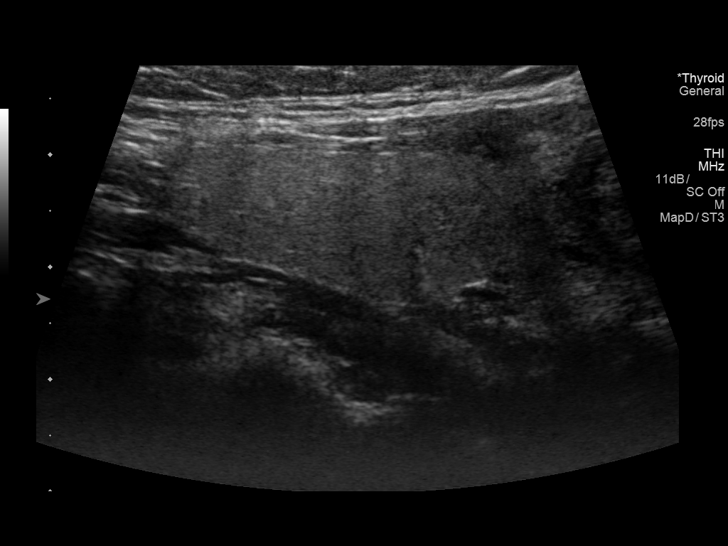
[im 22/88]
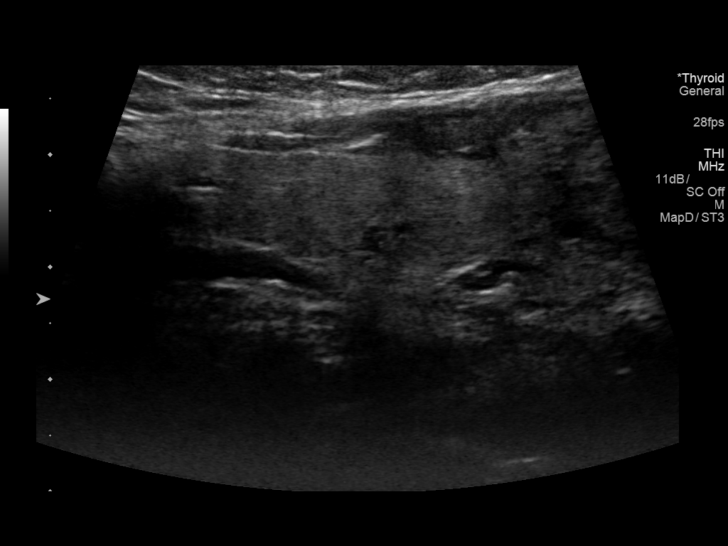
[im 30/88]
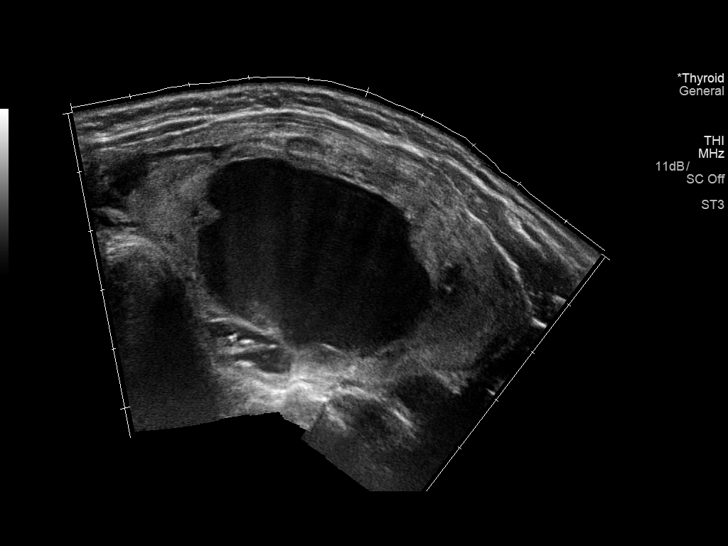
[im 37/88]
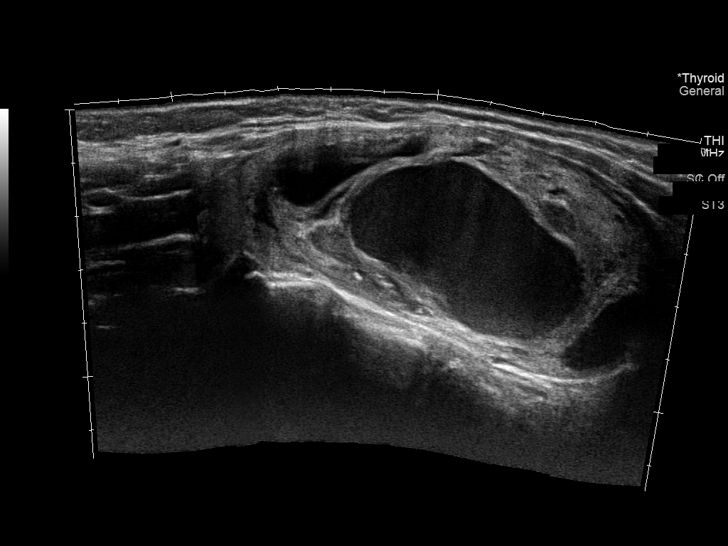
[im 44/88]
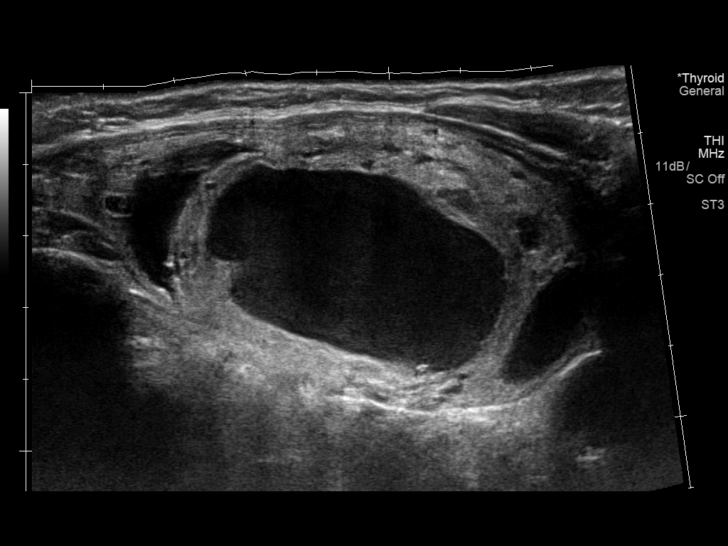
[im 51/88]
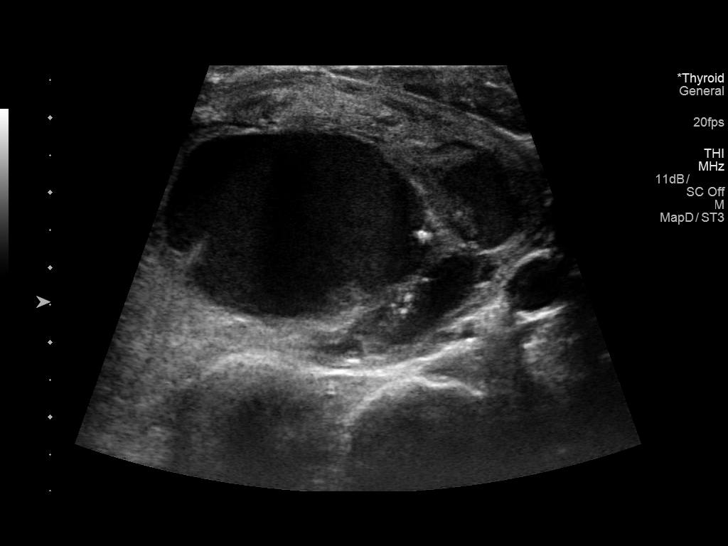
[im 59/88]
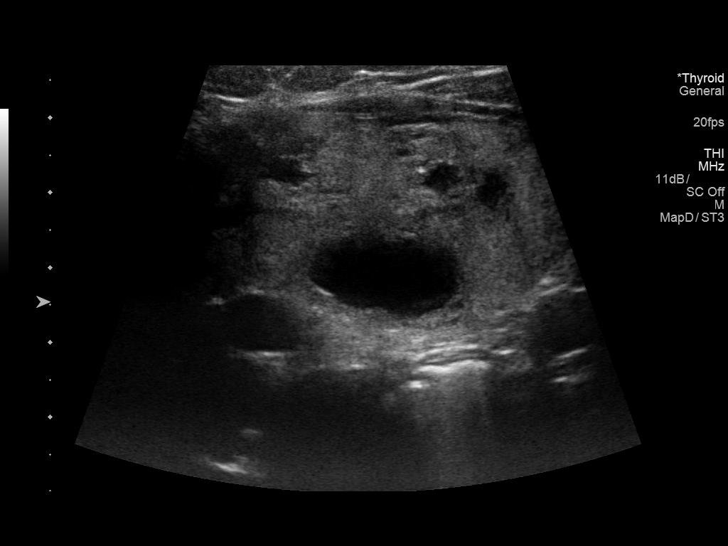
[im 66/88]
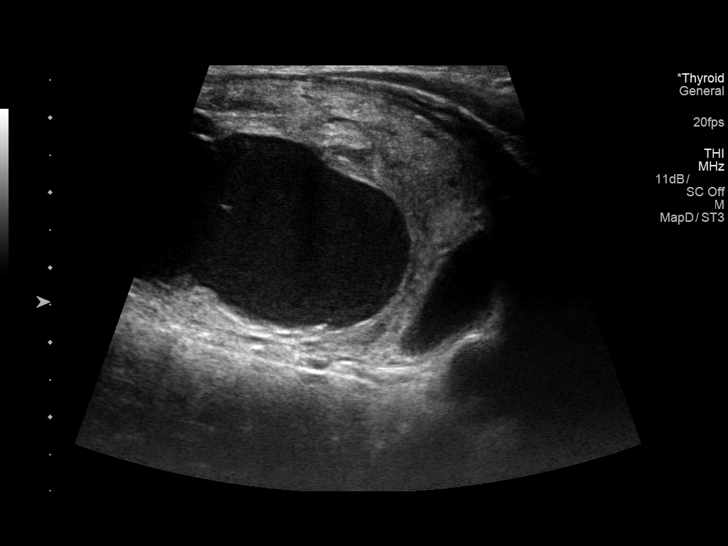
[im 73/88]
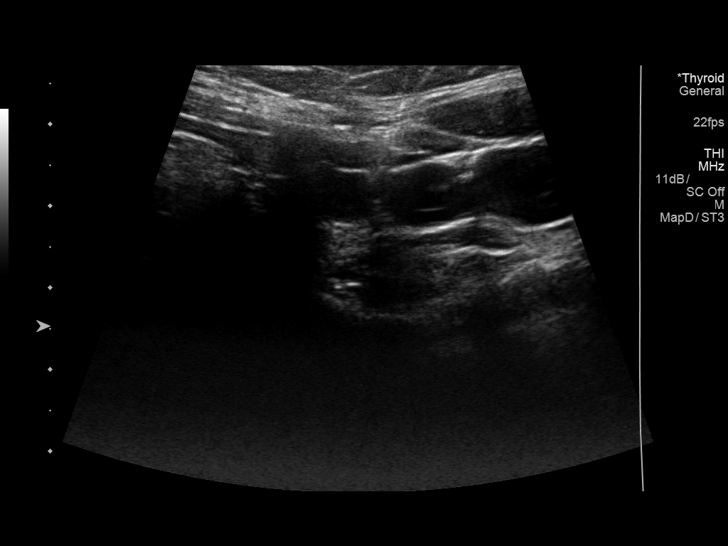
[im 80/88]
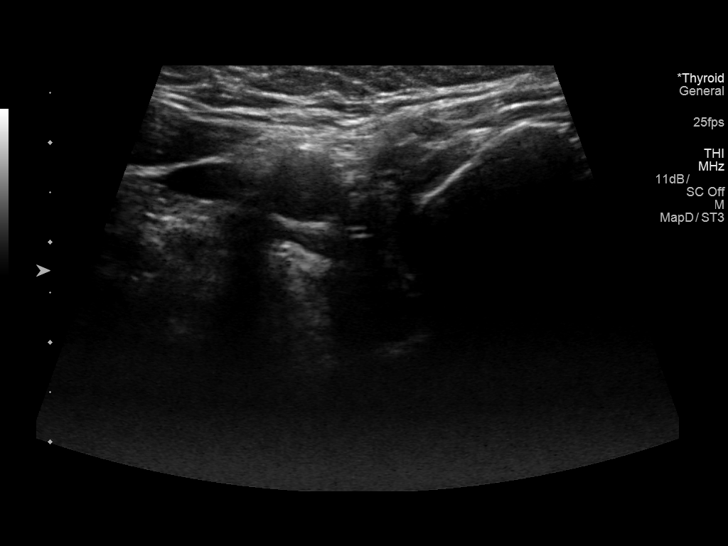
[im 88/88]
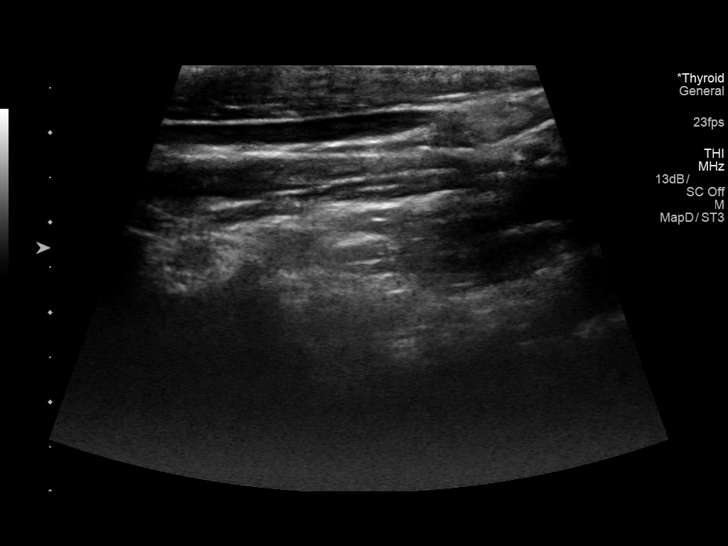

[13 of 25 positions shown; findings below may reference images not displayed]

FINDINGS: Parenchymal Echotexture: Mildly heterogenous

Isthmus: 0.9 cm thickness

Right lobe: 3.5 x 1.4 x 1.4 cm

Left lobe: 9 x 4 x 5.9 cm

_________________________________________________________

Estimated total number of nodules >/= 1 cm: 1

Number of spongiform nodules >/=  2 cm not described below (TR1): 0

Number of mixed cystic and solid nodules >/= 1.5 cm not described
below (TR2): 0

Nodule # 1:

Location: Left; Mid

Maximum size: 7.4 cm Cm; Other 2 dimensions: 7.2 x 4.6 cm

Composition: mixed cystic and solid (1)

Echogenicity: isoechoic (1)

Shape: not taller-than-wide (0)

Margins: ill-defined (0)

Echogenic foci: macrocalcifications (1)

ACR TI-RADS total points: 3.

ACR TI-RADS risk category: TR3 (3 points).

ACR TI-RADS recommendations:

**Given size (>/= 2.5 cm) and appearance, fine needle aspiration of
this mildly suspicious nodule should be considered based on TI-RADS
criteria.
IMPRESSION: 1. Mildly suspicious 7.4 cm complex left thyroid mass. Recommend FNA
biopsy.

The above is in keeping with the ACR TI-RADS recommendations - [HOSPITAL] 0830;[DATE].

## 2017-06-25 ENCOUNTER — Ambulatory Visit (HOSPITAL_COMMUNITY)
Admission: EM | Admit: 2017-06-25 | Discharge: 2017-06-25 | Disposition: A | Discharge status: Home / Self Care | Payer: Medicaid Other | Attending: Family Medicine | Admitting: Family Medicine

## 2017-06-25 ENCOUNTER — Encounter (HOSPITAL_COMMUNITY): Payer: Self-pay | Admitting: Family Medicine

## 2017-06-25 DIAGNOSIS — H60503 Unspecified acute noninfective otitis externa, bilateral: Secondary | ICD-10-CM | POA: Diagnosis not present

## 2017-06-25 MED ORDER — OFLOXACIN 0.3 % OP SOLN: OPHTHALMIC | 0 refills | Status: DC

## 2017-06-25 NOTE — ED Triage Notes (Signed)
Pt here for bilateral ear pain since yesterday. Reports with radiation into her throat.

## 2017-06-25 NOTE — ED Provider Notes (Signed)
[facility]    CSN: [id number] Arrival date & time: 06/25/17  1256     History   Chief Complaint Chief Complaint  Patient presents with  . Otalgia    HPI Debra Mills is a 32 y.o. female.   32 year old female comes in for 1 day history of bilateral ear pain.  States pain can radiate to her throat.  Denies URI symptoms such as cough, congestion, sore throat.  Subjective fever, took ibuprofen for it.  States also with ear fullness, decrease in hearing. Denies ear discharge. Denies cotton swab use, recent travels, swimming.  Has not tried anything for the symptoms other than ibuprofen.       Past Medical History:  Diagnosis Date  . Gestational diabetes   . Goiter     Patient Active Problem List   Diagnosis Date Noted  . Delivery with history of cesarean section 10/12/2016  . Status post repeat low transverse cesarean section 10/12/2016  . Enlarged thyroid gland 09/12/2016  . Gestational diabetes, diet controlled 07/20/2016  . 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 Supervision of high risk pregnancy, antepartum, third trimester 06/26/2016  . Previous cesarean section complicating pregnancy, antepartum condition or complication 25/36/6440  . Insufficient prenatal care 06/26/2016    Past Surgical History:  Procedure Laterality Date  . CESAREAN SECTION N/A 09/28/2014   Procedure: CESAREAN SECTION;  Surgeon: Guss Bunde, MD;  Location: [facility];  Service: Obstetrics;  Laterality: N/A;  . CESAREAN SECTION N/A 10/12/2016   Procedure: REPEAT CESAREAN SECTION;  Surgeon: Guss Bunde, MD;  Location: [facility];  Service: Obstetrics;  Laterality: N/A;    OB History    Gravida Para Term Preterm AB Living   2 1 1     2    SAB TAB Ectopic Multiple Live Births         0 2       Home Medications    Prior to Admission medications   Medication Sig Start Date End Date Taking? Authorizing Provider  acetaminophen (TYLENOL) 325  MG tablet Take 650 mg by mouth every 4 (four) hours as needed.    [provider]  docusate sodium (COLACE) 100 MG capsule Take 1 capsule (100 mg total) by mouth 2 (two) times daily. 10/15/16   Mumaw, Lauralyn Primes, DO  ibuprofen (ADVIL,MOTRIN) 600 MG tablet Take 1 tablet (600 mg total) by mouth every 6 (six) hours. 10/15/16   Mumaw, Lauralyn Primes, DO  ibuprofen (ADVIL,MOTRIN) 600 MG tablet Take 1 tablet (600 mg total) by mouth 3 (three) times daily. 02/21/17   Nuala Alpha, DO  meloxicam (MOBIC) 7.5 MG tablet Take 1 tablet (7.5 mg total) by mouth daily. 02/28/17   Aletha Halim, MD  ofloxacin (OCUFLOX) 0.3 % ophthalmic solution 10 drops in each ear once a day for 7 days 06/25/17   Ok Edwards, PA-C  oxyCODONE-acetaminophen (ROXICET) 5-325 MG tablet Take 1-2 tablets by mouth every 4 (four) hours as needed for severe pain. 10/15/16   Mumaw, Lauralyn Primes, DO  polyethylene glycol [facility] / GLYCOLAX) packet Take 17 g by mouth daily. 10/15/16   Mumaw, Lauralyn Primes, DO  Prenatal Vit-Fe Fumarate-FA (PRENATAL MULTIVITAMIN) TABS tablet Take 1 tablet by mouth daily at 12 noon.    [provider]    Family History Family History  Problem Relation Age of Onset  . Diabetes Father   . Kidney disease Father        benign tumor removed 1 kidney  .  Cancer Father   . Thyroid disease Father   . Birth defects Brother        heart defect    Social History Social History   Tobacco Use  . Smoking status: Never Smoker  . Smokeless tobacco: Never Used  Substance Use Topics  . Alcohol use: No  . Drug use: No     Allergies   Fish allergy   Review of Systems Review of Systems  Reason unable to perform ROS: See HPI as above.     Physical Exam Triage Vital Signs ED Triage Vitals [06/25/17 1352]  Enc Vitals Group     BP 105/61     Pulse Rate 100     Resp 18     Temp 99.8 F (37.7 C)     Temp src      SpO2 96 %     Weight      Height      Head  Circumference      Peak Flow      Pain Score      Pain Loc      Pain Edu?      Excl. in [place]?    No data found.  Updated Vital Signs BP 105/61   Pulse 100   Temp 99.8 F (37.7 C)   Resp 18   LMP 06/22/2017   SpO2 96%   Physical Exam  Constitutional: She is oriented to person, place, and time. She appears well-developed and well-nourished. No distress.  HENT:  Head: Normocephalic and atraumatic.  Tenderness on palpation of bilateral tragus.  Ear canal swollen shut, TM not visible.  Ear wick placed bilaterally.  Eyes: Conjunctivae are normal. Pupils are equal, round, and reactive to light.  Neurological: She is alert and oriented to person, place, and time.     UC Treatments / Results  Labs (all labs ordered are listed, but only abnormal results are displayed) Labs Reviewed - No data to display  EKG  EKG Interpretation None       Radiology No results found.  Procedures Procedures (including critical care time)  Medications Ordered in UC Medications - No data to display   Initial Impression / Assessment and Plan / UC Course  I have reviewed the triage vital signs and the nursing notes.  Pertinent labs & imaging results that were available during my care of the patient were reviewed by me and considered in my medical decision making (see chart for details).    Start ofloxacin as directed for otitis externa.  Ear wick placed bilaterally.  Patient can remove ear wick on own after treatment if symptoms resolved.  Return precautions given.  Patient expresses understanding and agrees to plan.  Final Clinical Impressions(s) / UC Diagnoses   Final diagnoses:  Acute otitis externa of both ears, unspecified type    ED Discharge Orders        Ordered    ofloxacin (OCUFLOX) 0.3 % ophthalmic solution     06/25/17 1517        Ok Edwards, PA-C 06/25/17 1521

## 2017-06-25 NOTE — Discharge Instructions (Signed)
Start ofloxacin as directed.  Ear wick was placed, this will allow the medicine to get into your ear canal.  Follow-up with PCP or here if symptoms not improving after treatment.

## 2017-07-03 ENCOUNTER — Encounter: Payer: Self-pay | Admitting: *Deleted

## 2017-09-08 ENCOUNTER — Emergency Department (HOSPITAL_COMMUNITY)
Admission: EM | Admit: 2017-09-08 | Discharge: 2017-09-08 | Disposition: A | Discharge status: Home / Self Care | Payer: Medicaid Other | Attending: Emergency Medicine | Admitting: Emergency Medicine

## 2017-09-08 ENCOUNTER — Encounter (HOSPITAL_COMMUNITY): Payer: Self-pay

## 2017-09-08 DIAGNOSIS — Z5321 Procedure and treatment not carried out due to patient leaving prior to being seen by health care provider: Secondary | ICD-10-CM | POA: Diagnosis not present

## 2017-09-08 DIAGNOSIS — R42 Dizziness and giddiness: Secondary | ICD-10-CM | POA: Diagnosis not present

## 2017-09-08 DIAGNOSIS — R51 Headache: Secondary | ICD-10-CM | POA: Insufficient documentation

## 2017-09-08 DIAGNOSIS — M542 Cervicalgia: Secondary | ICD-10-CM | POA: Diagnosis not present

## 2017-09-08 NOTE — ED Triage Notes (Signed)
Pt complains of neck pain, headache and dizziness since earlier today, no injury noted

## 2017-09-26 IMAGING — US US OB COMP LESS 14 WK
1 series · 15 of 28 positions shown · non-contrast
Comparison: Pelvic ultrasound performed 09/12/2014

CLINICAL DATA: Acute onset of pelvic cramping.  Initial encounter.

EXAM:
OBSTETRIC <14 WK US AND TRANSVAGINAL OB US
TECHNIQUE: Both transabdominal and transvaginal ultrasound examinations were
performed for complete evaluation of the gestation as well as the
maternal uterus, adnexal regions, and pelvic cul-de-sac.
Transvaginal technique was performed to assess early pregnancy.

[Series 1: us ob comp less 14 wk · 15 of 55 slices shown]
[im 1/55]
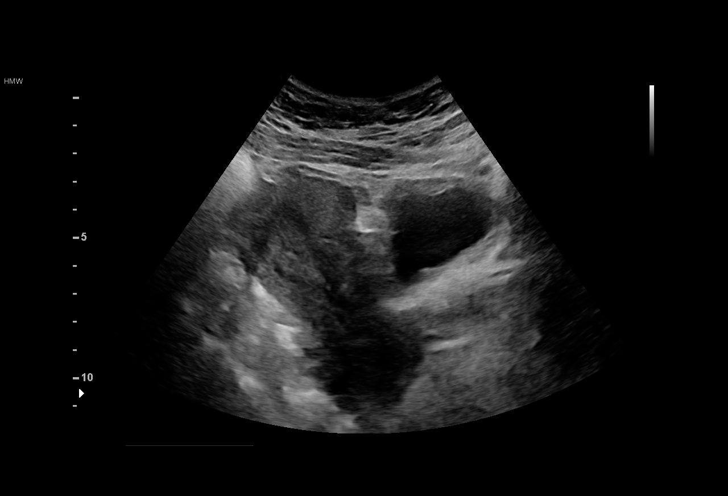
[im 5/55]
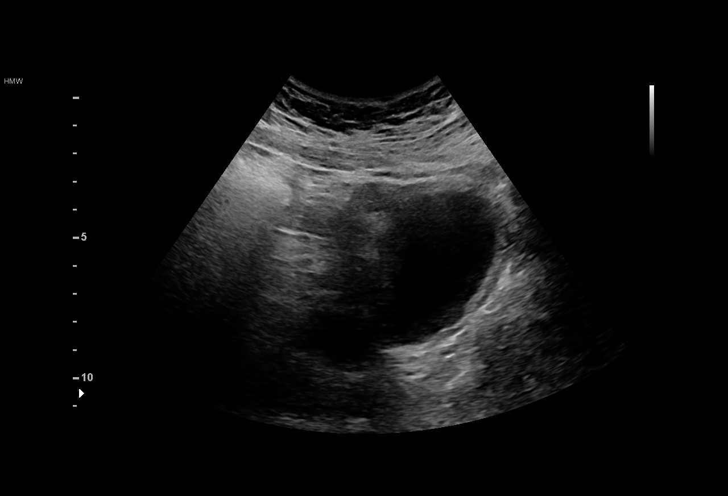
[im 9/55]
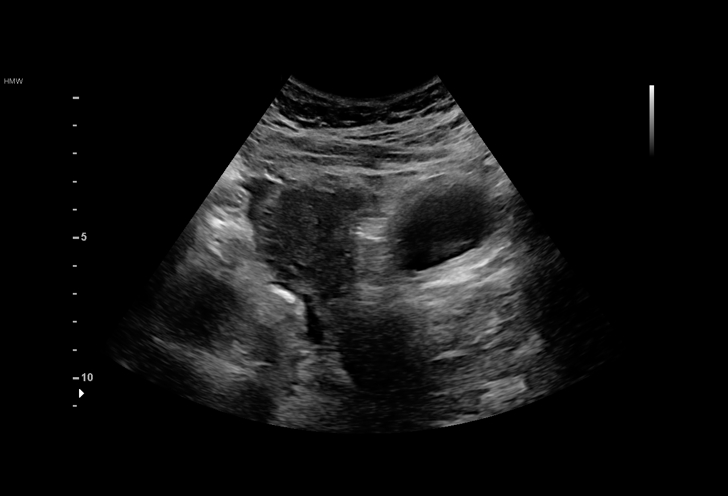
[im 13/55]
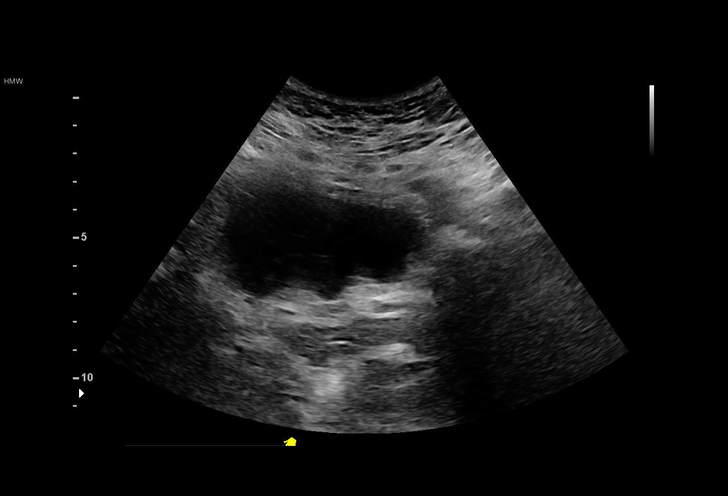
[im 17/55]
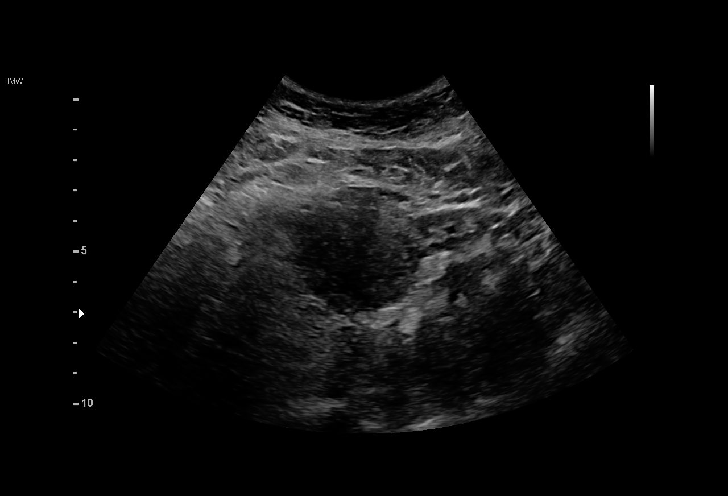
[im 21/55]
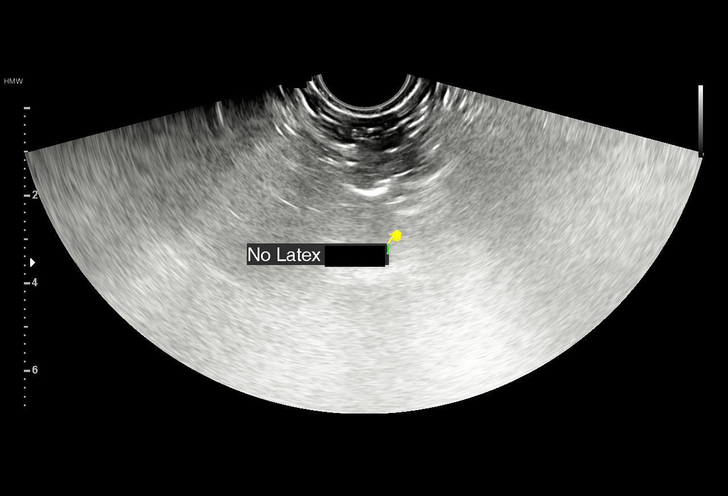
[im 25/55]
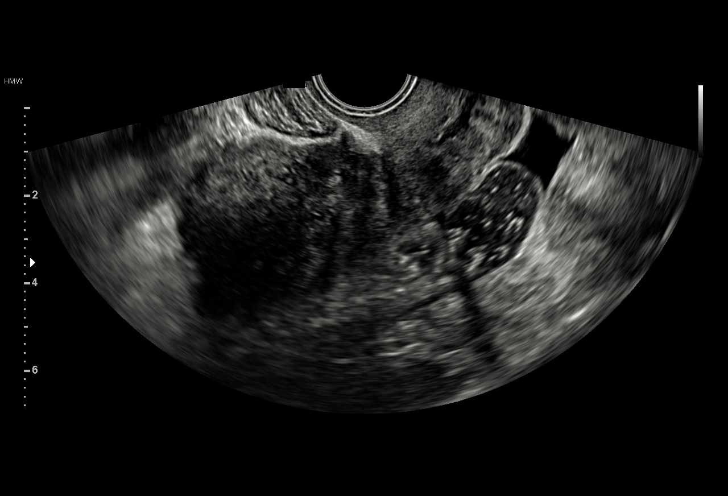
[im 29/55]
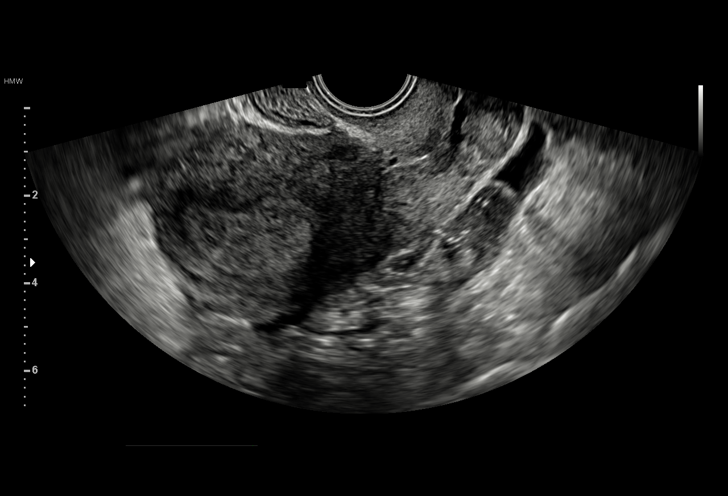
[im 31/55]
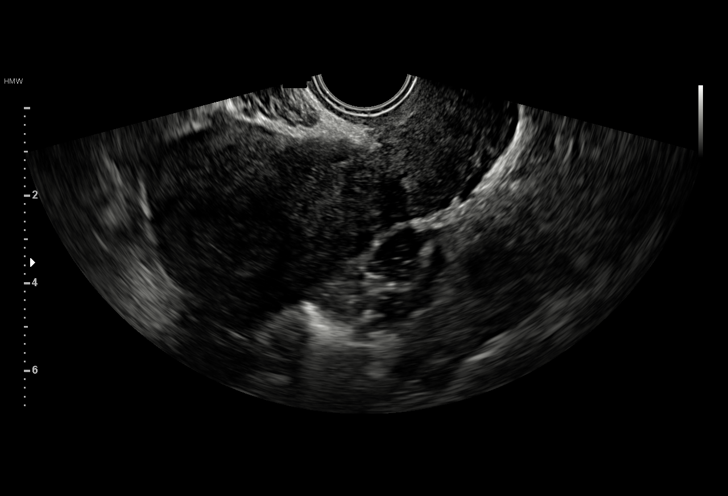
[im 35/55]
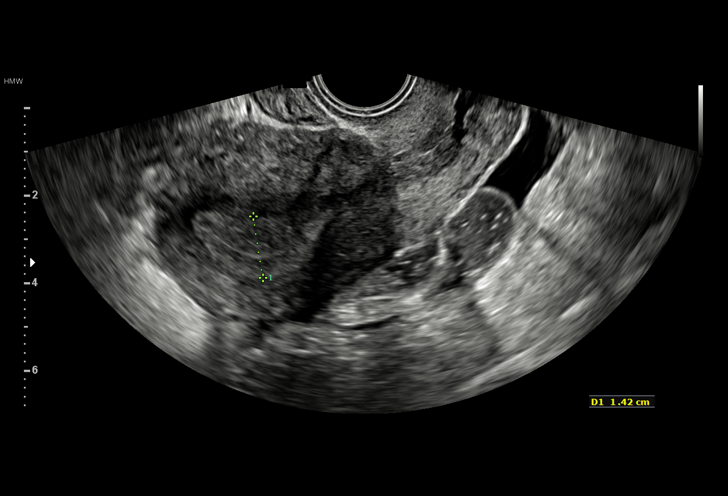
[im 39/55]
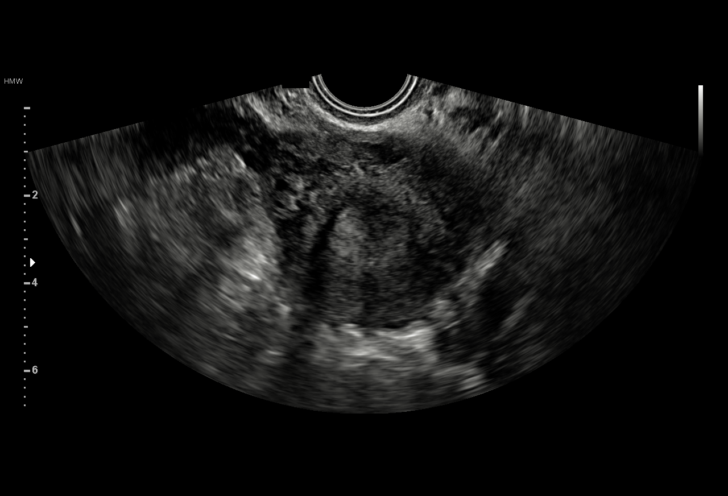
[im 43/55]
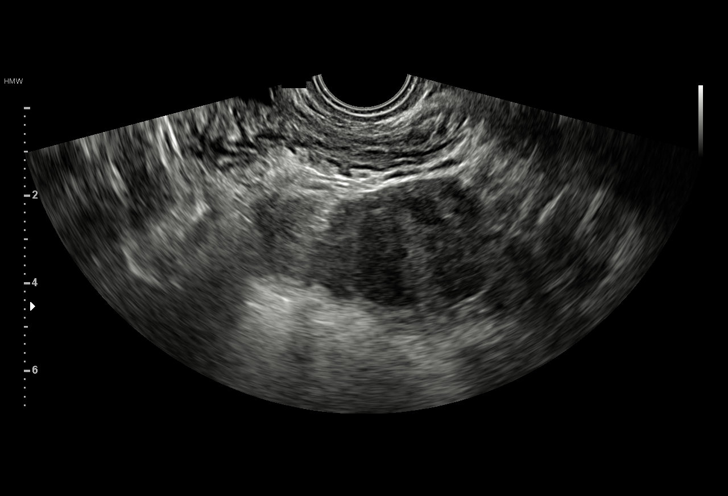
[im 47/55]
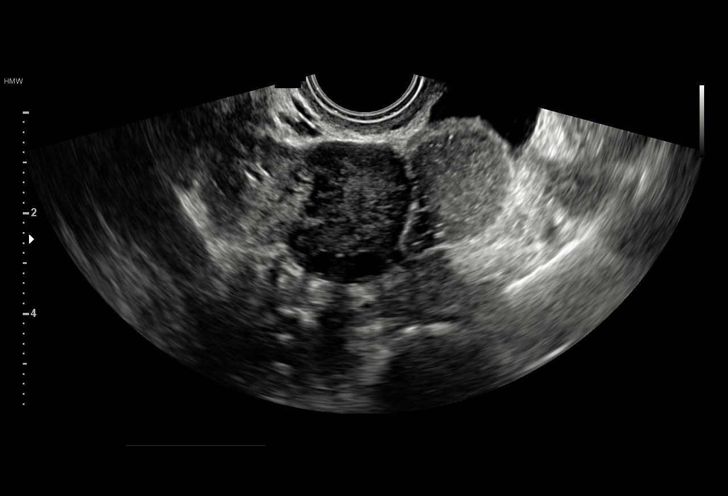
[im 51/55]
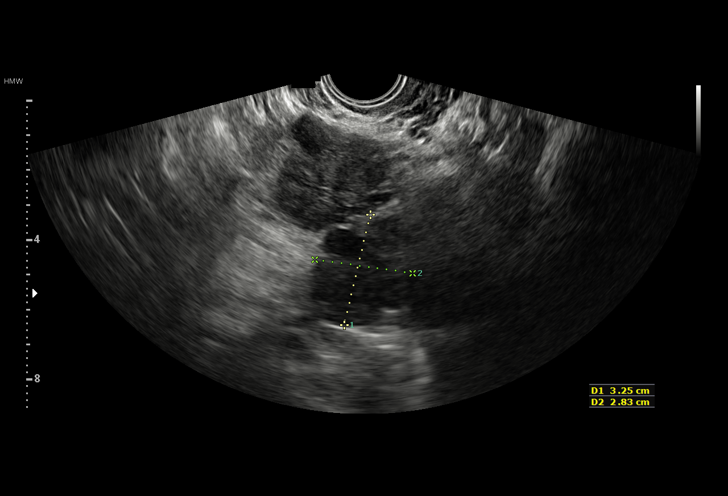
[im 55/55]
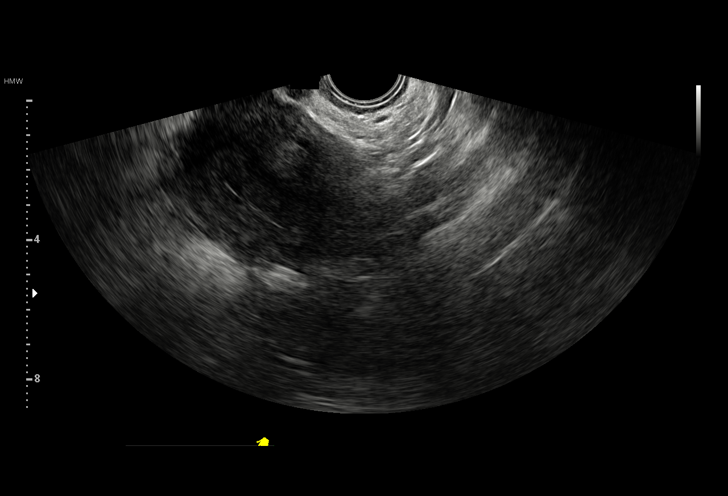

[15 of 28 positions shown; findings below may reference images not displayed]

FINDINGS: Intrauterine gestational sac: None seen.

Yolk sac:  N/A

Embryo:  N/A

Subchorionic hemorrhage:  None visualized.

Maternal uterus/adnexae: The uterus is unremarkable in appearance.

The ovaries are within normal limits the right ovary measures 2.8 x
2.2 x 2.2 cm, while the left ovary measures 3.3 x 2.8 x 2.7 cm. No
suspicious adnexal masses are seen. There is no evidence for ovarian
torsion.

Trace free fluid is seen within the pelvic cul-de-sac.
IMPRESSION: No intrauterine gestational sac seen. No evidence for ectopic
pregnancy at this time. This remains within normal limits, given the
quantitative beta HCG level of 137. If the quantitative beta HCG
level continues to rise, would perform follow-up pelvic ultrasound
in 2 weeks for further evaluation.

## 2017-12-27 ENCOUNTER — Other Ambulatory Visit: Payer: Self-pay

## 2017-12-27 ENCOUNTER — Emergency Department (HOSPITAL_COMMUNITY)
Admission: EM | Admit: 2017-12-27 | Discharge: 2017-12-28 | Disposition: A | Discharge status: Home / Self Care | Payer: Medicaid Other | Attending: Emergency Medicine | Admitting: Emergency Medicine

## 2017-12-27 ENCOUNTER — Emergency Department (HOSPITAL_COMMUNITY): Payer: Medicaid Other

## 2017-12-27 ENCOUNTER — Encounter (HOSPITAL_COMMUNITY): Payer: Self-pay

## 2017-12-27 DIAGNOSIS — M542 Cervicalgia: Secondary | ICD-10-CM | POA: Diagnosis not present

## 2017-12-27 DIAGNOSIS — Z79899 Other long term (current) drug therapy: Secondary | ICD-10-CM | POA: Diagnosis not present

## 2017-12-27 LAB — CBC WITH DIFFERENTIAL/PLATELET
BASOS PCT: 0 %
Basophils Absolute: 0 10*3/uL (ref 0.0–0.1)
EOS ABS: 0.5 10*3/uL (ref 0.0–0.7)
Eosinophils Relative: 4 %
HCT: 39.5 % (ref 36.0–46.0)
HEMOGLOBIN: 13.5 g/dL (ref 12.0–15.0)
Lymphocytes Relative: 26 %
Lymphs Abs: 3.4 10*3/uL (ref 0.7–4.0)
MCH: 27.3 pg (ref 26.0–34.0)
MCHC: 34.2 g/dL (ref 30.0–36.0)
MCV: 79.8 fL (ref 78.0–100.0)
Monocytes Absolute: 0.7 10*3/uL (ref 0.1–1.0)
Monocytes Relative: 6 %
Neutro Abs: 8.5 10*3/uL — ABNORMAL HIGH (ref 1.7–7.7)
Neutrophils Relative %: 64 %
Platelets: 216 10*3/uL (ref 150–400)
RBC: 4.95 MIL/uL (ref 3.87–5.11)
RDW: 12.8 % (ref 11.5–15.5)
WBC: 13.1 10*3/uL — AB (ref 4.0–10.5)

## 2017-12-27 LAB — POC URINE PREG, ED: Preg Test, Ur: NEGATIVE

## 2017-12-27 LAB — BASIC METABOLIC PANEL
ANION GAP: 11 (ref 5–15)
BUN: 14 mg/dL (ref 6–20)
CHLORIDE: 103 mmol/L (ref 98–111)
CO2: 26 mmol/L (ref 22–32)
Calcium: 9.8 mg/dL (ref 8.9–10.3)
Creatinine, Ser: 0.64 mg/dL (ref 0.44–1.00)
GFR calc non Af Amer: >60 mL/min (ref >60)
Glucose, Bld: 111 mg/dL — ABNORMAL HIGH (ref 70–99)
POTASSIUM: 3.9 mmol/L (ref 3.5–5.1)
SODIUM: 140 mmol/L (ref 135–145)

## 2017-12-27 MED ORDER — IOHEXOL 300 MG/ML  SOLN
75.0000 mL | Freq: Once | INTRAMUSCULAR | Status: AC | PRN
  Administered 2017-12-27: 75 mL via INTRAVENOUS

## 2017-12-27 MED ORDER — SODIUM CHLORIDE 0.9 % IV BOLUS
1000.0000 mL | Freq: Once | INTRAVENOUS | Status: AC
  Administered 2017-12-27: 1000 mL via INTRAVENOUS

## 2017-12-27 MED ORDER — KETOROLAC TROMETHAMINE 30 MG/ML IJ SOLN
30.0000 mg | Freq: Once | INTRAMUSCULAR | Status: AC
  Administered 2017-12-27: 30 mg via INTRAVENOUS
  Filled 2017-12-27: qty 1

## 2017-12-27 NOTE — ED Triage Notes (Signed)
Pt had a thyroid biopsy yesterday and is reporting pain at the site upon palpation and coughing. A&Ox4. No drainage from site.

## 2017-12-27 NOTE — ED Provider Notes (Signed)
[facility] DEPT Provider Note   CSN: [id number] Arrival date & time: 12/27/17  1929     History   Chief Complaint Chief Complaint  Patient presents with  . Neck Pain    s/p thyroid biopsy    HPI Debra Mills is a 32 y.o. female presenting for anterior neck pain after undergoing thyroid fine-needle aspiration biopsy yesterday for concerning nodule.  Patient states that she tolerated the procedure well with no complications afterwards however today she began sneezing and coughing which caused pain to the area.  Patient states that she has a sharp moderate pain with sneezing and coughing however denies pain to the area at rest.  Patient states that she has been eating and drinking well without increased pain today.  Patient denies shortness of breath or change in her voice.  Patient states that her thyroid area has been swollen for some quite some time which is why she had seen the endocrinologist however she states today that she feels that her neck is more swollen than it was yesterday.  Patient denies color change to the area, bleeding, streaking, shortness of breath, trouble swallowing.  HPI  Past Medical History:  Diagnosis Date  . Gestational diabetes   . Goiter     Patient Active Problem List   Diagnosis Date Noted  . Delivery with history of cesarean section 10/12/2016  . Status post repeat low transverse cesarean section 10/12/2016  . Enlarged thyroid gland 09/12/2016  . Gestational diabetes, diet controlled 07/20/2016  . 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 Supervision of high risk pregnancy, antepartum, third trimester 06/26/2016  . Previous cesarean section complicating pregnancy, antepartum condition or complication 05/14/3233  . Insufficient prenatal care 06/26/2016    Past Surgical History:  Procedure Laterality Date  . CESAREAN SECTION N/A 09/28/2014   Procedure: CESAREAN SECTION;  Surgeon: Guss Bunde, MD;  Location: [facility];  Service: Obstetrics;  Laterality: N/A;  . CESAREAN SECTION N/A 10/12/2016   Procedure: REPEAT CESAREAN SECTION;  Surgeon: Guss Bunde, MD;  Location: [facility];  Service: Obstetrics;  Laterality: N/A;     OB History    Gravida  2   Para  1   Term  1   Preterm      AB      Living  2     SAB      TAB      Ectopic      Multiple  0   Live Births  2            Home Medications    Prior to Admission medications   Medication Sig Start Date End Date Taking? Authorizing Provider  acetaminophen (TYLENOL) 325 MG tablet Take 650 mg by mouth every 4 (four) hours as needed.    [provider]  docusate sodium (COLACE) 100 MG capsule Take 1 capsule (100 mg total) by mouth 2 (two) times daily. 10/15/16   Mumaw, Lauralyn Primes, DO  ibuprofen (ADVIL,MOTRIN) 600 MG tablet Take 1 tablet (600 mg total) by mouth every 6 (six) hours. 10/15/16   Mumaw, Lauralyn Primes, DO  ibuprofen (ADVIL,MOTRIN) 600 MG tablet Take 1 tablet (600 mg total) by mouth 3 (three) times daily. 02/21/17   Nuala Alpha, DO  meloxicam (MOBIC) 7.5 MG tablet Take 1 tablet (7.5 mg total) by mouth daily. 02/28/17   Aletha Halim, MD  ofloxacin (OCUFLOX) 0.3 % ophthalmic solution 10 drops in each ear once a day  for 7 days 06/25/17   Ok Edwards, PA-C  oxyCODONE-acetaminophen (ROXICET) 5-325 MG tablet Take 1-2 tablets by mouth every 4 (four) hours as needed for severe pain. 10/15/16   Mumaw, Lauralyn Primes, DO  polyethylene glycol [facility] / GLYCOLAX) packet Take 17 g by mouth daily. 10/15/16   Mumaw, Lauralyn Primes, DO  Prenatal Vit-Fe Fumarate-FA (PRENATAL MULTIVITAMIN) TABS tablet Take 1 tablet by mouth daily at 12 noon.    [provider]    Family History Family History  Problem Relation Age of Onset  . Diabetes Father   . Kidney disease Father        benign tumor removed 1 kidney  . Cancer Father   . Thyroid disease Father   .  Birth defects Brother        heart defect    Social History Social History   Tobacco Use  . Smoking status: Never Smoker  . Smokeless tobacco: Never Used  Substance Use Topics  . Alcohol use: No  . Drug use: No     Allergies   Fish allergy   Review of Systems Review of Systems  Constitutional: Negative.  Negative for chills, fatigue and fever.  HENT: Negative.  Negative for drooling, rhinorrhea, sore throat, trouble swallowing and voice change.        Anterior neck pain.  Eyes: Negative.  Negative for visual disturbance.  Respiratory: Positive for cough. Negative for shortness of breath and stridor.   Cardiovascular: Negative.  Negative for chest pain.  Gastrointestinal: Negative.  Negative for abdominal pain, blood in stool, diarrhea, nausea and vomiting.  Genitourinary: Negative.  Negative for dysuria and hematuria.  Musculoskeletal: Negative.  Negative for arthralgias and myalgias.  Skin: Negative.  Negative for color change and rash.  Neurological: Negative.  Negative for dizziness, speech difficulty, weakness and headaches.     Physical Exam Updated Vital Signs BP 108/63   Pulse 78   Temp 97.8 F (36.6 C) (Oral)   Resp 18   SpO2 100%   Physical Exam  Constitutional: She is oriented to person, place, and time. She appears well-developed and well-nourished. No distress.  HENT:  Head: Normocephalic and atraumatic.  Right Ear: External ear normal.  Left Ear: External ear normal.  Nose: Nose normal.  Mouth/Throat: Uvula is midline, oropharynx is clear and moist and mucous membranes are normal. No trismus in the jaw. No uvula swelling. No posterior oropharyngeal edema, posterior oropharyngeal erythema or tonsillar abscesses.  Eyes: Pupils are equal, round, and reactive to light. EOM are normal.  Neck: Trachea normal, normal range of motion and phonation normal. Neck supple. No spinous process tenderness and no muscular tenderness present. No tracheal deviation, no  erythema and normal range of motion present. Thyroid mass and thyromegaly present.    Tenderness and swelling to the anterior lower neck.  No surrounding erythema, no streaking no increased warmth or signs of infection at this time.  No active bleeding present.  Small pinpoint area where fine-needle aspiration occurred, no discharge present.  Patient is able to swallow with increased pain.  Pulmonary/Chest: Effort normal. No respiratory distress.  Abdominal: Soft. There is no tenderness. There is no rebound and no guarding.  Musculoskeletal: Normal range of motion.  Neurological: She is alert and oriented to person, place, and time.  Skin: Skin is warm and dry.  Psychiatric: She has a normal mood and affect. Her behavior is normal.     ED Treatments / Results  Labs (all labs ordered are listed,  but only abnormal results are displayed) Labs Reviewed  CBC WITH DIFFERENTIAL/PLATELET - Abnormal; Notable for the following components:      Result Value   WBC 13.1 (*)    Neutro Abs 8.5 (*)    All other components within normal limits  BASIC METABOLIC PANEL - Abnormal; Notable for the following components:   Glucose, Bld 111 (*)    All other components within normal limits  POC URINE PREG, ED    EKG None  Radiology No results found.  Procedures Procedures (including critical care time)  Medications Ordered in ED Medications  ketorolac (TORADOL) 30 MG/ML injection 30 mg (has no administration in time range)  sodium chloride 0.9 % bolus 1,000 mL (1,000 mLs Intravenous New Bag/Given 12/27/17 2145)     Initial Impression / Assessment and Plan / ED Course  I have reviewed the triage vital signs and the nursing notes.  Pertinent labs & imaging results that were available during my care of the patient were reviewed by me and considered in my medical decision making (see chart for details).  Clinical Course as of Dec 27 2257  Sat Dec 27, 2017  2245 Handoff given to Antonietta Breach PA-C  at [facility]   [BM]    Clinical Course User Index [BM] Deliah [place], [place]   Patient presenting with anterior neck pain and subjective increased anterior neck swelling after fine-needle aspiration of thyroid nodule yesterday at [facility] with endocrinologist Dr. Hartford Poli.  Patient is afebrile, not tachycardic, not hypotensive.  Patient is in no acute distress, not ill-appearing.  Patient is able to swallow liquids with increased pain, no choking.  No signs of infection physical exam.  Case discussed with Dr. Vallery Ridge who recommends CT of neck to assess for surgical complications after fine-needle aspiration.  Baseline labs drawn, mildly elevated WBC on CBC, BMP nonacute, urine preg negative.  Handoff given to [employer] PA-C at shift change.  CT soft tissue neck with contrast ordered, fluid boluses been given.  Plan at this time is to await results of CT soft tissue neck and consult with ENT regarding results.  Final Clinical Impressions(s) / ED Diagnoses   Final diagnoses:  None    ED Discharge Orders    None       Gari Crown 12/27/17 2302    Charlesetta Shanks, MD 01/02/18 [phone]

## 2017-12-27 NOTE — ED Notes (Signed)
Pt aware urine sample is needed 

## 2017-12-28 MED ORDER — METHOCARBAMOL 500 MG PO TABS: 500.0000 mg | ORAL_TABLET | Freq: Two times a day (BID) | ORAL | 0 refills | Status: DC | PRN

## 2017-12-28 NOTE — Discharge Instructions (Signed)
We recommend that you continue 600 mg ibuprofen every 6 hours for management of discomfort.  Follow-up with your endocrinologist as scheduled.  We also advised to follow-up with your primary doctor.  Return to the emergency department if you experience worsening pain, fever over 100.4 F, difficulty breathing, or difficulty swallowing.

## 2017-12-28 NOTE — ED Provider Notes (Signed)
1:02 AM Patient care assumed from [facility], PA-C at change of shift.  Patient recently underwent thyroid fine-needle aspiration.  Presenting for some discomfort with sneezing and coughing.  Subjectively feels the swelling may be slightly increased.  Imaging today shows known mass to the anterior thyroid.  This is similar in appearance compared with ultrasound.  It appears to be contacting the patient's airway with a slight rightward displacement.  Airway remains widely patent.  There is no evidence of postprocedure complication.  No inflammatory changes.  Patient reassessed.  She is hemodynamically stable.  No present complaints.  She is tolerating her secretions.  No hypoxia or complaints of shortness of breath.  Denies difficulty breathing or swallowing prior to arrival.  Have communicated the results of CT today.  Patient verbalizes understanding.  Have encouraged continued use of ibuprofen with outpatient endocrine follow-up.  Return precautions discussed and provided. Patient discharged in stable condition with no unaddressed concerns.   Results for orders placed or performed during the hospital encounter of 12/27/17  CBC with Differential  Result Value Ref Range   WBC 13.1 (H) 4.0 - 10.5 K/uL   RBC 4.95 3.87 - 5.11 MIL/uL   Hemoglobin 13.5 12.0 - 15.0 g/dL   HCT 39.5 36.0 - 46.0 %   MCV 79.8 78.0 - 100.0 fL   MCH 27.3 26.0 - 34.0 pg   MCHC 34.2 30.0 - 36.0 g/dL   RDW 12.8 11.5 - 15.5 %   Platelets 216 150 - 400 K/uL   Neutrophils Relative % 64 %   Neutro Abs 8.5 (H) 1.7 - 7.7 K/uL   Lymphocytes Relative 26 %   Lymphs Abs 3.4 0.7 - 4.0 K/uL   Monocytes Relative 6 %   Monocytes Absolute 0.7 0.1 - 1.0 K/uL   Eosinophils Relative 4 %   Eosinophils Absolute 0.5 0.0 - 0.7 K/uL   Basophils Relative 0 %   Basophils Absolute 0.0 0.0 - 0.1 K/uL  Basic metabolic panel  Result Value Ref Range   Sodium 140 135 - 145 mmol/L   Potassium 3.9 3.5 - 5.1 mmol/L   Chloride 103 98 - 111  mmol/L   CO2 26 22 - 32 mmol/L   Glucose, Bld 111 (H) 70 - 99 mg/dL   BUN 14 6 - 20 mg/dL   Creatinine, Ser 0.64 0.44 - 1.00 mg/dL   Calcium 9.8 8.9 - 10.3 mg/dL   GFR calc non Af Amer >60 >60 mL/min   GFR calc Af Amer >60 >60 mL/min   Anion gap 11 5 - 15  POC Urine Pregnancy, ED (do NOT order at [facility])  Result Value Ref Range   Preg Test, Ur NEGATIVE NEGATIVE   Ct Soft Tissue Neck W Contrast  Result Date: 12/27/2017 CLINICAL DATA:  32 y/o F; thyroid biopsy yesterday. Pain at the site of biopsy on palpitation. Coughing. EXAM: CT NECK WITH CONTRAST TECHNIQUE: Multidetector CT imaging of the neck was performed using the standard protocol following the bolus administration of intravenous contrast. CONTRAST:  79mL OMNIPAQUE IOHEXOL 300 MG/ML  SOLN COMPARISON:  06/04/2016 thyroid ultrasound. FINDINGS: Pharynx and larynx: Normal. No mass or swelling. Salivary glands: No inflammation, mass, or stone. Thyroid: Heterogeneous mass with both solid enlarged cystic components within the anterior thyroid gland measuring 5.2 x 6.7 x 7.4 cm (AP x ML x CC series 8, image 44 and series 9, image 46-47) similar to the prior thyroid ultrasound given differences in technique. The mass displaces rightward the airway which is widely  patent. No inflammatory changes. Lymph nodes: None enlarged or abnormal density. Vascular: Negative. Limited intracranial: Negative. Visualized orbits: Negative. Mastoids and visualized paranasal sinuses: Clear. Skeleton: No acute or aggressive process. Upper chest: Incompletely visualized anterior mediastinal soft tissue interdigitated with fat compatible with persistent thymus/thymic hyperplasia. No masslike component. Other: None. IMPRESSION: 1. Mass within the anterior thyroid measuring up to 7.4 cm similar to prior thyroid ultrasound given differences in technique. No inflammatory changes. Mass effect on the airway which is displaced rightward but widely patent. 2. 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 No masslike component identified within the field of view. Electronically Signed   By: Kristine Garbe M.D.   On: 12/27/2017 23:38      Antonietta Breach, PA-C 12/28/17 0111    Charlesetta Shanks, MD 01/02/18 [phone]

## 2018-01-09 ENCOUNTER — Ambulatory Visit: Payer: Self-pay | Admitting: General Surgery

## 2018-01-09 NOTE — H&P (Signed)
History of Present Illness Ralene Ok MD; 01/09/2018 2:17 PM) The patient is a 32 year old female who presents with a thyroid nodule. Referred by: Dr. Hartford Poli Chief Complaint: Left thyroid nodule  Patient is a 32 year old female, from [place], otherwise healthy who comes in with a 2-3 year history of a left thyroid goiter. Patient states that she feels that over the last several months to years that the thyroid nodule has been stable in size. In speaking with her and the interpreter it appears that at the time of her pregnancy there was some fluctuation in size.  Patient states that she has some dysphagia with both liquids and solids. Patient also has a sense of asphyxiation when she lays down.  Patient has undergone ultrasound which revealed a 7.4 x 7.2 x 4.6 nodule. Patient underwent FNA and reportedly had a benign finding. I do not have those exact records currently however this is obtained from Dr. Shirlyn Goltz notes    Diagnostic Studies History Sabino Gasser; 01/09/2018 2:02 PM) Mammogram  never Pap Smear  1-5 years ago  Allergies Sabino Gasser; 01/09/2018 2:02 PM) No Known Drug Allergies [01/09/2018]: Allergies Reconciled   Medication History Sabino Gasser; 01/09/2018 2:02 PM) No Current Medications Medications Reconciled  Family History Sabino Gasser; 01/09/2018 2:02 PM) Alcohol Abuse  Family Members In General. Anesthetic complications  Family Members In General. Bleeding disorder  Family Members In General. Breast Cancer  Family Members In [place], Father. Cervical Cancer  Family Members In General. Colon Cancer  Family Members In General. Colon Polyps  Family Members In General. Depression  Family Members In General. Diabetes Mellitus  Family Members In General. Heart Disease  Family Members In General. Hypertension  Family Members In General. Ischemic Bowel Disease  Family Members In General. Kidney Disease  Father. Malignant  Neoplasm Of Pancreas  Family Members In General. Melanoma  Family Members In General. Migraine Headache  Family Members In General. Ovarian Cancer  Family Members In General. Prostate Cancer  Family Members In General. Rectal Cancer  Family Members In General. Respiratory Condition  Family Members In General. Seizure disorder  Family Members In General. Thyroid problems  Father.  Pregnancy / Birth History Sabino Gasser; 01/09/2018 2:02 PM) Age at menarche  31 years. Maternal age  64-30  Other Problems Sabino Gasser; 01/09/2018 2:02 PM) Back Pain  Chest pain     Review of Systems Ralene Ok MD; 01/09/2018 2:15 PM) HEENT Present- Sore Throat. Not Present- Earache, Hearing Loss, Hoarseness, Nose Bleed, Oral Ulcers, Ringing in the Ears, Seasonal Allergies, Sinus Pain, Visual Disturbances, Wears glasses/contact lenses and Yellow Eyes. Respiratory Not Present- Bloody sputum, Chronic Cough, Difficulty Breathing, Snoring and Wheezing. Breast Not Present- Breast Mass, Breast Pain, Nipple Discharge and Skin Changes. Gastrointestinal Present- Nausea. Not Present- Abdominal Pain, Bloating, Bloody Stool, Change in Bowel Habits, Chronic diarrhea, Constipation, Difficulty Swallowing, Excessive gas, Gets full quickly at meals, Hemorrhoids, Indigestion, Rectal Pain and Vomiting. Musculoskeletal Present- Back Pain. Not Present- Joint Pain, Joint Stiffness, Muscle Pain, Muscle Weakness and Swelling of Extremities. Neurological Present- Headaches and Weakness. Not Present- Decreased Memory, Fainting, Numbness, Seizures, Tingling, Tremor and Trouble walking. Psychiatric Not Present- Anxiety, Bipolar, Change in Sleep Pattern, Depression, Fearful and Frequent crying. All other systems negative  Vitals Sabino Gasser; 01/09/2018 2:03 PM) 01/09/2018 2:02 PM Weight: 169.38 lb Height: 61in Body Surface Area: 1.76 m Body Mass Index: 32 kg/m  Temp.: 99.105F(Oral)  Pulse: 98 (Regular)   BP: 124/80 (Sitting, Left Arm, Standard)  Physical Exam Ralene Ok MD; 01/09/2018 2:18 PM) The physical exam findings are as follows: Note:Constitutional: No acute distress, conversant, appears stated age  Eyes: Anicteric sclerae, moist conjunctiva, no lid lag  Neck:trachea midline, no cervical lymphadenopathy, left thyroid enlargement  Lungs: Clear to auscultation biilaterally, normal respiratory effot  Cardiovascular: regular rate & rhythm, no murmurs, no peripheal edema, pedal pulses 2+  GI: Soft, no masses or hepatosplenomegaly, non-tender to palpation  MSK: Normal gait, no clubbing cyanosis, edema  Skin: No rashes, palpation reveals normal skin turgor  Psychiatric: Appropriate judgment and insight, oriented to person, place, and time ,    Assessment & Plan Ralene Ok MD; 01/09/2018 2:19 PM) THYROID NODULE (E04.1) Impression: 32 year old female with left thyroid nodule patient also has signs and symptoms of dysphagia and asphyxiation when laying down. 1. Will proceed to the operating room for left thyroidectomy.  All risks and benefits were discussed with the patient to generally include: infection, bleeding, damage to the recurrent laryngeal nerve, damage to parathyroid glands, and possible need for further surgery. Alternatives were offered and described. All questions were answered and the patient voiced understanding of the procedure and wishes to proceed.

## 2018-02-02 NOTE — Pre-Procedure Instructions (Signed)
Debra Mills  02/02/2018      [id number] [address], [place] [address] Phone: [phone] Fax: [phone]  [facility] [place], [place] [facility] [address] AT [place] [address] [place] [address] Phone: [phone] Fax: [phone]    Your procedure is scheduled on 02/10/18.  Report to [facility] Admitting at 6 A.M.  Call this number if you have problems the morning of surgery:  [phone]   Remember:  Do not eat or drink after midnight.  o    Take these medicines the morning of surgery with A SIP OF WATER ---tylenol    Do not wear jewelry, make-up or nail polish.  Do not wear lotions, powders, or perfumes, or deodorant.  Do not shave 48 hours prior to surgery.  Men may shave face and neck.  Do not bring valuables to the hospital.  [facility] is not responsible for any belongings or valuables.  Contacts, dentures or bridgework may not be worn into surgery.  Leave your suitcase in the car.  After surgery it may be brought to your room.  For patients admitted to the hospital, discharge time will be determined by your treatment team.  Patients discharged the day of surgery will not be allowed to drive home.   Name and phone number of your driver:   Do not take any aspirin,anti-inflammatories,vitamins,or herbal supplements 5-7 days prior to surgery. Special instructions:  [facility] - Preparing for Surgery  Before surgery, you can play an important role.  Because skin is not sterile, your skin needs to be as free of germs as possible.  You can reduce the number of germs on you skin by washing with CHG (chlorahexidine gluconate) soap before surgery.  CHG is an antiseptic cleaner which kills germs and bonds with the skin to continue killing germs even after washing.  Oral Hygiene is also important in reducing the risk of infection.  Remember to brush  your teeth with your regular toothpaste the morning of surgery.  Please DO NOT use if you have an allergy to CHG or antibacterial soaps.  If your skin becomes reddened/irritated stop using the CHG and inform your nurse when you arrive at Short Stay.  Do not shave (including legs and underarms) for at least 48 hours prior to the first CHG shower.  You may shave your face.  Please follow these instructions carefully:   1.  Shower with CHG Soap the night before surgery and the morning of Surgery.  2.  If you choose to wash your hair, wash your hair first as usual with your normal shampoo.  3.  After you shampoo, rinse your hair and body thoroughly to remove the shampoo. 4.  Use CHG as you would any other liquid soap.  You can apply chg directly to the skin and wash gently with a      scrungie or washcloth.           5.  Apply the CHG Soap to your body ONLY FROM THE NECK DOWN.   Do not use on open wounds or open sores. Avoid contact with your eyes, ears, mouth and genitals (private parts).  Wash genitals (private parts) with your normal soap.  6.  Wash thoroughly, paying special attention to the area where your surgery will be performed.  7.  Thoroughly rinse your body with warm water from the neck down.  8.  DO NOT shower/wash with your normal soap after using and rinsing off the CHG Soap.  9.  Pat yourself dry with a clean towel.            10.  Wear clean pajamas.            11.  Place clean sheets on your bed the night of your first shower and do not sleep with pets.  Day of Surgery  Do not apply any lotions/deoderants the morning of surgery.   Please wear clean clothes to the hospital/surgery center. Remember to brush your teeth with toothpaste.    Please read over the following fact sheets that you were given.

## 2018-02-03 ENCOUNTER — Ambulatory Visit (HOSPITAL_COMMUNITY)
Admission: RE | Admit: 2018-02-03 | Discharge: 2018-02-03 | Disposition: A | Discharge status: Home / Self Care | Payer: Medicaid Other | Source: Ambulatory Visit | Attending: Anesthesiology | Admitting: Anesthesiology

## 2018-02-03 ENCOUNTER — Encounter (HOSPITAL_COMMUNITY)
Admission: RE | Admit: 2018-02-03 | Discharge: 2018-02-03 | Disposition: A | Discharge status: Home / Self Care | Payer: Medicaid Other | Source: Ambulatory Visit | Attending: General Surgery | Admitting: General Surgery

## 2018-02-03 ENCOUNTER — Other Ambulatory Visit: Payer: Self-pay

## 2018-02-03 ENCOUNTER — Encounter (HOSPITAL_COMMUNITY): Payer: Self-pay

## 2018-02-03 DIAGNOSIS — Z01818 Encounter for other preprocedural examination: Secondary | ICD-10-CM | POA: Diagnosis present

## 2018-02-03 HISTORY — DX: Headache: R51

## 2018-02-03 HISTORY — DX: Dyspnea, unspecified: R06.00

## 2018-02-03 HISTORY — DX: Headache, unspecified: R51.9

## 2018-02-03 HISTORY — DX: Hemorrhagic condition, unspecified: D69.9

## 2018-02-03 LAB — CBC
HCT: 43 % (ref 36.0–46.0)
HEMOGLOBIN: 14.5 g/dL (ref 12.0–15.0)
MCH: 27.4 pg (ref 26.0–34.0)
MCHC: 33.7 g/dL (ref 30.0–36.0)
MCV: 81.3 fL (ref 78.0–100.0)
Platelets: 224 10*3/uL (ref 150–400)
RBC: 5.29 MIL/uL — AB (ref 3.87–5.11)
RDW: 12.4 % (ref 11.5–15.5)
WBC: 12.8 10*3/uL — AB (ref 4.0–10.5)

## 2018-02-03 NOTE — Progress Notes (Signed)
PCP:  Junie Spencer, [place] @ [facility] on [address].   Interpreter: Hollace Kinnier from [place]  Pt. Reported she's had sob, along with chest pain at times under stressful situations, involving discussions with her husband for the past 3 yrs. "It comes on then it resolves ". Discussed with pt. If she needed to talk to a Education officer, museum and she states "no ".   Will request EKG from PCP.

## 2018-02-04 NOTE — Progress Notes (Signed)
Anesthesia Chart Review:  Case:  [id number] Date/Time:  02/10/18 0745   Procedure:  LEFT THYROIDECTOMY (Left )   Anesthesia type:  General   Pre-op diagnosis:  LEFT THYROID NODULE   Location:  [facility] OR ROOM 09 / [facility] OR   Surgeon:  Ralene Ok, MD      DISCUSSION: 32 yo female never smoker. Pertinent hx includes HA, Dyspnea, Gestational diabetes, Pt report of easy bleeding when cut. S/p cesarian section 09/28/14 and 10/13/2016. Pt speaks Arabic, interpreter needed.  At PAT pt reported that she occasionally has episodes of feeling SOB with chest tightness. This has been ongoing for several years. She says it only happens during stressful situations; usually during discussions with her husband. She denies any exertional symptoms. She has young children and says she is able to carry them up stairs without issue. She has no current CP or SOB. She has no cardiac history. Her only cardiac risk factor appears to be her weight, BMI 32. Her symptoms do not sound anginal. More consistent with anxiety given the situational nature and her ability to perform moderate workloads without symptoms.  Anticipate she can proceed as planned barring acute status change.   VS: BP 107/73   Pulse 89   Temp 37.1 C   Resp 20   Ht 5\' 1"  (1.549 m)   Wt 77.2 kg   LMP 01/11/2018   SpO2 100%   BMI 32.18 kg/m   PROVIDERS: Mindi Curling, PA-C is PCP   LABS: Labs reviewed: Acceptable for surgery. (all labs ordered are listed, but only abnormal results are displayed)  Labs Reviewed  CBC - Abnormal; Notable for the following components:      Result Value   WBC 12.8 (*)    RBC 5.29 (*)    All other components within normal limits     IMAGES: CHEST - 2 VIEW 02/03/2018  COMPARISON:  None.  FINDINGS: The heart size and mediastinal contours are within normal limits. Both lungs are clear. The visualized skeletal structures are unremarkable.  IMPRESSION: No active cardiopulmonary  disease.  EKG: 11/24/2017: Sinus rhythm 81bpm. LAFB. Nonspecific anterior t-wave changes.    CV: N/A  Past Medical History:  Diagnosis Date  . Bleeds easily ([place])    when she has a cut.  Marland Kitchen Dyspnea   . Gestational diabetes   . Goiter   . Headache     Past Surgical History:  Procedure Laterality Date  . CESAREAN SECTION N/A 09/28/2014   Procedure: CESAREAN SECTION;  Surgeon: Guss Bunde, MD;  Location: [facility];  Service: Obstetrics;  Laterality: N/A;  . CESAREAN SECTION N/A 10/12/2016   Procedure: REPEAT CESAREAN SECTION;  Surgeon: Guss Bunde, MD;  Location: [facility];  Service: Obstetrics;  Laterality: N/A;    MEDICATIONS: . acetaminophen (TYLENOL) 500 MG tablet  . docusate sodium (COLACE) 100 MG capsule  . ibuprofen (ADVIL,MOTRIN) 600 MG tablet  . ibuprofen (ADVIL,MOTRIN) 600 MG tablet  . meloxicam (MOBIC) 7.5 MG tablet  . methocarbamol (ROBAXIN) 500 MG tablet  . ofloxacin (OCUFLOX) 0.3 % ophthalmic solution  . oxyCODONE-acetaminophen (ROXICET) 5-325 MG tablet  . polyethylene glycol (MIRALAX / GLYCOLAX) packet   No current facility-administered medications for this encounter.     Wynonia Musty [facility] Short Stay Center/Anesthesiology Phone [phone] 02/04/2018 12:35 PM

## 2018-02-09 ENCOUNTER — Encounter (HOSPITAL_COMMUNITY): Payer: Self-pay | Admitting: Anesthesiology

## 2018-02-09 NOTE — Anesthesia Preprocedure Evaluation (Addendum)
Anesthesia Evaluation  Patient identified by MRN, date of birth, ID band Patient awake    Reviewed: Allergy & Precautions, NPO status , Patient's Chart, lab work & pertinent test results  Airway Mallampati: II  TM Distance: >3 FB Neck ROM: Full    Dental no notable dental hx. (+) Teeth Intact   Pulmonary shortness of breath and with exertion,    Pulmonary exam normal breath sounds clear to auscultation       Cardiovascular negative cardio ROS Normal cardiovascular exam Rhythm:Regular Rate:Normal     Neuro/Psych  Headaches, negative psych ROS   GI/Hepatic negative GI ROS, Neg liver ROS,   Endo/Other  diabetes, GestationalLeft Thyroid nodule Obesity  Renal/GU negative Renal ROS  negative genitourinary   Musculoskeletal negative musculoskeletal ROS (+)   Abdominal (+) + obese,   Peds  Hematology negative hematology ROS (+)   Anesthesia Other Findings   Reproductive/Obstetrics                            Anesthesia Physical Anesthesia Plan  ASA: II  Anesthesia Plan: General   Post-op Pain Management:    Induction: Intravenous  PONV Risk Score and Plan: 4 or greater and Scopolamine patch - Pre-op, Ondansetron, Dexamethasone, Treatment may vary due to age or medical condition and Midazolam  Airway Management Planned: Oral ETT  Additional Equipment:   Intra-op Plan:   Post-operative Plan: Extubation in OR  Informed Consent: I have reviewed the patients History and Physical, chart, labs and discussed the procedure including the risks, benefits and alternatives for the proposed anesthesia with the patient or authorized representative who has indicated his/her understanding and acceptance.   Dental advisory given  Plan Discussed with: CRNA and Surgeon  Anesthesia Plan Comments:        Anesthesia Quick Evaluation

## 2018-02-10 ENCOUNTER — Encounter (HOSPITAL_COMMUNITY): Payer: Self-pay | Admitting: *Deleted

## 2018-02-10 ENCOUNTER — Other Ambulatory Visit: Payer: Self-pay

## 2018-02-10 ENCOUNTER — Observation Stay (HOSPITAL_COMMUNITY)
Admission: RE | Admit: 2018-02-10 | Discharge: 2018-02-11 | Disposition: A | Discharge status: Home / Self Care | Payer: Medicaid Other | Source: Ambulatory Visit | Attending: General Surgery | Admitting: General Surgery

## 2018-02-10 ENCOUNTER — Encounter (HOSPITAL_COMMUNITY)
Admission: RE | Disposition: A | Discharge status: Home / Self Care | Payer: Self-pay | Source: Ambulatory Visit | Attending: General Surgery

## 2018-02-10 ENCOUNTER — Ambulatory Visit (HOSPITAL_COMMUNITY): Payer: Medicaid Other | Admitting: Physician Assistant

## 2018-02-10 ENCOUNTER — Ambulatory Visit (HOSPITAL_COMMUNITY): Payer: Medicaid Other | Admitting: Certified Registered Nurse Anesthetist

## 2018-02-10 DIAGNOSIS — Z8042 Family history of malignant neoplasm of prostate: Secondary | ICD-10-CM | POA: Insufficient documentation

## 2018-02-10 DIAGNOSIS — R1310 Dysphagia, unspecified: Secondary | ICD-10-CM | POA: Diagnosis not present

## 2018-02-10 DIAGNOSIS — Z9889 Other specified postprocedural states: Secondary | ICD-10-CM

## 2018-02-10 DIAGNOSIS — Z803 Family history of malignant neoplasm of breast: Secondary | ICD-10-CM | POA: Insufficient documentation

## 2018-02-10 DIAGNOSIS — E041 Nontoxic single thyroid nodule: Secondary | ICD-10-CM | POA: Insufficient documentation

## 2018-02-10 DIAGNOSIS — Z800 Family history of malignant neoplasm of digestive organs: Secondary | ICD-10-CM | POA: Insufficient documentation

## 2018-02-10 DIAGNOSIS — E890 Postprocedural hypothyroidism: Secondary | ICD-10-CM

## 2018-02-10 DIAGNOSIS — Z833 Family history of diabetes mellitus: Secondary | ICD-10-CM | POA: Insufficient documentation

## 2018-02-10 DIAGNOSIS — Z9089 Acquired absence of other organs: Secondary | ICD-10-CM

## 2018-02-10 DIAGNOSIS — C73 Malignant neoplasm of thyroid gland: Secondary | ICD-10-CM | POA: Diagnosis not present

## 2018-02-10 HISTORY — PX: THYROIDECTOMY: SHX17

## 2018-02-10 LAB — POCT PREGNANCY, URINE: Preg Test, Ur: NEGATIVE

## 2018-02-10 SURGERY — THYROIDECTOMY
Anesthesia: General | Site: Neck | Laterality: Left

## 2018-02-10 MED ORDER — CHLORHEXIDINE GLUCONATE CLOTH 2 % EX PADS: 6.0000 | MEDICATED_PAD | Freq: Once | CUTANEOUS | Status: DC

## 2018-02-10 MED ORDER — LACTATED RINGERS IV SOLN
INTRAVENOUS | Status: DC | PRN
  Administered 2018-02-10: 08:00:00 via INTRAVENOUS

## 2018-02-10 MED ORDER — ONDANSETRON HCL 4 MG/2ML IJ SOLN
INTRAMUSCULAR | Status: AC
  Filled 2018-02-10: qty 2

## 2018-02-10 MED ORDER — SUGAMMADEX SODIUM 200 MG/2ML IV SOLN
INTRAVENOUS | Status: DC | PRN
  Administered 2018-02-10: 200 mg via INTRAVENOUS

## 2018-02-10 MED ORDER — HYDROMORPHONE HCL 1 MG/ML IJ SOLN
0.2500 mg | INTRAMUSCULAR | Status: DC | PRN
  Administered 2018-02-10 (×2): 0.5 mg via INTRAVENOUS

## 2018-02-10 MED ORDER — DEXTROSE-NACL 5-0.9 % IV SOLN
INTRAVENOUS | Status: DC
  Administered 2018-02-10 – 2018-02-11 (×2): via INTRAVENOUS

## 2018-02-10 MED ORDER — ONDANSETRON HCL 4 MG/2ML IJ SOLN: 4.0000 mg | Freq: Four times a day (QID) | INTRAMUSCULAR | Status: DC | PRN

## 2018-02-10 MED ORDER — METOCLOPRAMIDE HCL 5 MG/ML IJ SOLN: 10.0000 mg | Freq: Once | INTRAMUSCULAR | Status: DC | PRN

## 2018-02-10 MED ORDER — PHENOL 1.4 % MT LIQD: 1.0000 | OROMUCOSAL | Status: DC | PRN

## 2018-02-10 MED ORDER — TRAMADOL HCL 50 MG PO TABS
50.0000 mg | ORAL_TABLET | Freq: Four times a day (QID) | ORAL | Status: DC | PRN
  Administered 2018-02-11 (×2): 50 mg via ORAL
  Filled 2018-02-10 (×2): qty 1

## 2018-02-10 MED ORDER — FENTANYL CITRATE (PF) 100 MCG/2ML IJ SOLN
INTRAMUSCULAR | Status: DC | PRN
  Administered 2018-02-10: 150 ug via INTRAVENOUS
  Administered 2018-02-10: 50 ug via INTRAVENOUS

## 2018-02-10 MED ORDER — MEPERIDINE HCL 50 MG/ML IJ SOLN: 6.2500 mg | INTRAMUSCULAR | Status: DC | PRN

## 2018-02-10 MED ORDER — DEXMEDETOMIDINE HCL IN NACL 200 MCG/50ML IV SOLN
INTRAVENOUS | Status: AC
  Filled 2018-02-10: qty 50

## 2018-02-10 MED ORDER — DEXAMETHASONE SODIUM PHOSPHATE 10 MG/ML IJ SOLN
INTRAMUSCULAR | Status: DC | PRN
  Administered 2018-02-10: 10 mg via INTRAVENOUS

## 2018-02-10 MED ORDER — LIDOCAINE 2% (20 MG/ML) 5 ML SYRINGE
INTRAMUSCULAR | Status: DC | PRN
  Administered 2018-02-10: 60 mg via INTRAVENOUS
  Administered 2018-02-10: 40 mg via INTRAVENOUS

## 2018-02-10 MED ORDER — ONDANSETRON HCL 4 MG/2ML IJ SOLN
INTRAMUSCULAR | Status: DC | PRN
  Administered 2018-02-10: 4 mg via INTRAVENOUS

## 2018-02-10 MED ORDER — CEFAZOLIN SODIUM-DEXTROSE 2-4 GM/100ML-% IV SOLN
2.0000 g | INTRAVENOUS | Status: AC
  Administered 2018-02-10: 2 g via INTRAVENOUS
  Filled 2018-02-10: qty 100

## 2018-02-10 MED ORDER — KETOROLAC TROMETHAMINE 30 MG/ML IJ SOLN
INTRAMUSCULAR | Status: AC
  Filled 2018-02-10: qty 1

## 2018-02-10 MED ORDER — PROPOFOL 10 MG/ML IV BOLUS
INTRAVENOUS | Status: AC
  Filled 2018-02-10: qty 20

## 2018-02-10 MED ORDER — PHENYLEPHRINE 40 MCG/ML (10ML) SYRINGE FOR IV PUSH (FOR BLOOD PRESSURE SUPPORT)
PREFILLED_SYRINGE | INTRAVENOUS | Status: DC | PRN
  Administered 2018-02-10: 80 ug via INTRAVENOUS
  Administered 2018-02-10 (×3): 40 ug via INTRAVENOUS
  Administered 2018-02-10: 80 ug via INTRAVENOUS
  Administered 2018-02-10: 40 ug via INTRAVENOUS

## 2018-02-10 MED ORDER — ROCURONIUM BROMIDE 50 MG/5ML IV SOSY
PREFILLED_SYRINGE | INTRAVENOUS | Status: DC | PRN
  Administered 2018-02-10: 50 mg via INTRAVENOUS
  Administered 2018-02-10: 20 mg via INTRAVENOUS

## 2018-02-10 MED ORDER — ACETAMINOPHEN 500 MG PO TABS: 1000.0000 mg | ORAL_TABLET | Freq: Four times a day (QID) | ORAL | Status: DC | PRN

## 2018-02-10 MED ORDER — BUPIVACAINE HCL 0.25 % IJ SOLN
INTRAMUSCULAR | Status: DC | PRN
  Administered 2018-02-10: 12 mL

## 2018-02-10 MED ORDER — FENTANYL CITRATE (PF) 250 MCG/5ML IJ SOLN
INTRAMUSCULAR | Status: AC
  Filled 2018-02-10: qty 5

## 2018-02-10 MED ORDER — MIDAZOLAM HCL 2 MG/2ML IJ SOLN
INTRAMUSCULAR | Status: AC
  Filled 2018-02-10: qty 2

## 2018-02-10 MED ORDER — DEXAMETHASONE SODIUM PHOSPHATE 10 MG/ML IJ SOLN
INTRAMUSCULAR | Status: AC
  Filled 2018-02-10: qty 1

## 2018-02-10 MED ORDER — SODIUM CHLORIDE 0.9 % IV SOLN
INTRAVENOUS | Status: DC | PRN
  Administered 2018-02-10: 20 ug/min via INTRAVENOUS

## 2018-02-10 MED ORDER — LIDOCAINE 2% (20 MG/ML) 5 ML SYRINGE
INTRAMUSCULAR | Status: AC
  Filled 2018-02-10: qty 5

## 2018-02-10 MED ORDER — HYDROMORPHONE HCL 1 MG/ML IJ SOLN
INTRAMUSCULAR | Status: AC
  Filled 2018-02-10: qty 1

## 2018-02-10 MED ORDER — ONDANSETRON 4 MG PO TBDP: 4.0000 mg | ORAL_TABLET | Freq: Four times a day (QID) | ORAL | Status: DC | PRN

## 2018-02-10 MED ORDER — DEXMEDETOMIDINE HCL 200 MCG/2ML IV SOLN
INTRAVENOUS | Status: DC | PRN
  Administered 2018-02-10: 8 ug via INTRAVENOUS
  Administered 2018-02-10: 4 ug via INTRAVENOUS

## 2018-02-10 MED ORDER — BUPIVACAINE HCL (PF) 0.25 % IJ SOLN
INTRAMUSCULAR | Status: AC
  Filled 2018-02-10: qty 30

## 2018-02-10 MED ORDER — 0.9 % SODIUM CHLORIDE (POUR BTL) OPTIME
TOPICAL | Status: DC | PRN
  Administered 2018-02-10: 1000 mL

## 2018-02-10 MED ORDER — MIDAZOLAM HCL 5 MG/5ML IJ SOLN
INTRAMUSCULAR | Status: DC | PRN
  Administered 2018-02-10: 2 mg via INTRAVENOUS

## 2018-02-10 MED ORDER — KETOROLAC TROMETHAMINE 30 MG/ML IJ SOLN
30.0000 mg | Freq: Four times a day (QID) | INTRAMUSCULAR | Status: DC | PRN
  Administered 2018-02-10: 30 mg via INTRAVENOUS
  Filled 2018-02-10: qty 1

## 2018-02-10 MED ORDER — ACETAMINOPHEN 10 MG/ML IV SOLN
1000.0000 mg | Freq: Once | INTRAVENOUS | Status: AC
  Administered 2018-02-10: 1000 mg via INTRAVENOUS
  Filled 2018-02-10: qty 100

## 2018-02-10 MED ORDER — PROPOFOL 10 MG/ML IV BOLUS
INTRAVENOUS | Status: DC | PRN
  Administered 2018-02-10: 180 mg via INTRAVENOUS

## 2018-02-10 SURGICAL SUPPLY — 50 items
ATTRACTOMAT 16X20 MAGNETIC DRP (DRAPES) ×3 IMPLANT
BLADE CLIPPER SURG (BLADE) IMPLANT
BLADE SURG 15 STRL LF DISP TIS (BLADE) ×1 IMPLANT
BLADE SURG 15 STRL SS (BLADE) ×2
CANISTER SUCT 3000ML PPV (MISCELLANEOUS) ×3 IMPLANT
CHLORAPREP W/TINT 26ML (MISCELLANEOUS) ×3 IMPLANT
CLIP VESOCCLUDE MED 24/CT (CLIP) ×3 IMPLANT
CLIP VESOCCLUDE SM WIDE 24/CT (CLIP) ×3 IMPLANT
CONT SPEC 4OZ CLIKSEAL STRL BL (MISCELLANEOUS) IMPLANT
COVER SURGICAL LIGHT HANDLE (MISCELLANEOUS) ×3 IMPLANT
COVER WAND RF STERILE (DRAPES) ×3 IMPLANT
CRADLE DONUT ADULT HEAD (MISCELLANEOUS) ×3 IMPLANT
DERMABOND ADVANCED (GAUZE/BANDAGES/DRESSINGS) ×2
DERMABOND ADVANCED .7 DNX12 (GAUZE/BANDAGES/DRESSINGS) ×1 IMPLANT
DRAPE LAPAROTOMY 100X72 PEDS (DRAPES) ×3 IMPLANT
DRAPE UTILITY XL STRL (DRAPES) ×6 IMPLANT
ELECT COATED BLADE 2.86 ST (ELECTRODE) ×3 IMPLANT
ELECT REM PT RETURN 9FT ADLT (ELECTROSURGICAL) ×3
ELECTRODE REM PT RTRN 9FT ADLT (ELECTROSURGICAL) ×1 IMPLANT
GAUZE 4X4 16PLY RFD (DISPOSABLE) ×3 IMPLANT
GAUZE SPONGE 4X4 12PLY STRL (GAUZE/BANDAGES/DRESSINGS) ×3 IMPLANT
GLOVE BIO SURGEON STRL SZ7.5 (GLOVE) ×3 IMPLANT
GOWN STRL REUS W/ TWL LRG LVL3 (GOWN DISPOSABLE) ×1 IMPLANT
GOWN STRL REUS W/ TWL XL LVL3 (GOWN DISPOSABLE) ×1 IMPLANT
GOWN STRL REUS W/TWL LRG LVL3 (GOWN DISPOSABLE) ×2
GOWN STRL REUS W/TWL XL LVL3 (GOWN DISPOSABLE) ×2
HEMOSTAT SURGICEL 2X4 FIBR (HEMOSTASIS) ×3 IMPLANT
ILLUMINATOR WAVEGUIDE N/F (MISCELLANEOUS) ×3 IMPLANT
KIT BASIN OR (CUSTOM PROCEDURE TRAY) ×3 IMPLANT
KIT TURNOVER KIT B (KITS) ×3 IMPLANT
NEEDLE HYPO 25GX1X1/2 BEV (NEEDLE) ×3 IMPLANT
NS IRRIG 1000ML POUR BTL (IV SOLUTION) ×3 IMPLANT
PACK SURGICAL SETUP 50X90 (CUSTOM PROCEDURE TRAY) ×3 IMPLANT
PAD ARMBOARD 7.5X6 YLW CONV (MISCELLANEOUS) ×3 IMPLANT
PENCIL BUTTON HOLSTER BLD 10FT (ELECTRODE) ×3 IMPLANT
SHEARS HARMONIC 9CM CVD (BLADE) ×3 IMPLANT
SPECIMEN JAR MEDIUM (MISCELLANEOUS) ×3 IMPLANT
SPONGE INTESTINAL PEANUT (DISPOSABLE) ×3 IMPLANT
SUT MNCRL AB 4-0 PS2 18 (SUTURE) ×3 IMPLANT
SUT SILK 2 0 (SUTURE) ×2
SUT SILK 2-0 18XBRD TIE 12 (SUTURE) ×1 IMPLANT
SUT SILK 3 0 (SUTURE) ×2
SUT SILK 3-0 18XBRD TIE 12 (SUTURE) ×1 IMPLANT
SUT VIC AB 3-0 SH 18 (SUTURE) ×3 IMPLANT
SYR BULB 3OZ (MISCELLANEOUS) ×3 IMPLANT
SYR CONTROL 10ML LL (SYRINGE) ×3 IMPLANT
TOWEL OR 17X24 6PK STRL BLUE (TOWEL DISPOSABLE) ×3 IMPLANT
TOWEL OR 17X26 10 PK STRL BLUE (TOWEL DISPOSABLE) ×3 IMPLANT
TUBE CONNECTING 12'X1/4 (SUCTIONS) ×1
TUBE CONNECTING 12X1/4 (SUCTIONS) ×2 IMPLANT

## 2018-02-10 NOTE — Progress Notes (Signed)
Patient received from OR,S/P Left Thyroidectomy report given by Elta Guadeloupe, RN. Patient stable, easily aroused to verbal stimuli. No complaints of pain

## 2018-02-10 NOTE — H&P (Signed)
History of Present Illness  The patient is a 32 year old female who presents with a thyroid nodule. Referred by: Dr. Hartford Poli Chief Complaint: Left thyroid nodule  Patient is a 32 year old female, from [place], otherwise healthy who comes in with a 2-3 year history of a left thyroid goiter. Patient states that she feels that over the last several months to years that the thyroid nodule has been stable in size. In speaking with her and the interpreter it appears that at the time of her pregnancy there was some fluctuation in size.  Patient states that she has some dysphagia with both liquids and solids. Patient also has a sense of asphyxiation when she lays down.  Patient has undergone ultrasound which revealed a 7.4 x 7.2 x 4.6 nodule. Patient underwent FNA and reportedly had a benign finding. I do not have those exact records currently however this is obtained from Dr. Shirlyn Goltz notes    Diagnostic Studies History  Mammogram  never Pap Smear  1-5 years ago  Allergies No Known Drug Allergies [01/09/2018]: Allergies Reconciled   Medication History  No Current Medications Medications Reconciled  Family History  Alcohol Abuse  Family Members In General. Anesthetic complications  Family Members In General. Bleeding disorder  Family Members In General. Breast Cancer  Family Members In [place], Father. Cervical Cancer  Family Members In General. Colon Cancer  Family Members In General. Colon Polyps  Family Members In General. Depression  Family Members In General. Diabetes Mellitus  Family Members In General. Heart Disease  Family Members In General. Hypertension  Family Members In General. Ischemic Bowel Disease  Family Members In General. Kidney Disease  Father. Malignant Neoplasm Of Pancreas  Family Members In General. Melanoma  Family Members In General. Migraine Headache  Family Members In General. Ovarian Cancer  Family Members  In General. Prostate Cancer  Family Members In General. Rectal Cancer  Family Members In General. Respiratory Condition  Family Members In General. Seizure disorder  Family Members In General. Thyroid problems  Father.  Pregnancy / Birth History Age at menarche  43 years. Maternal age  57-30  Other Problems  Back Pain  Chest pain     Review of Systems HEENT Present- Sore Throat. Not Present- Earache, Hearing Loss, Hoarseness, Nose Bleed, Oral Ulcers, Ringing in the Ears, Seasonal Allergies, Sinus Pain, Visual Disturbances, Wears glasses/contact lenses and Yellow Eyes. Respiratory Not Present- Bloody sputum, Chronic Cough, Difficulty Breathing, Snoring and Wheezing. Breast Not Present- Breast Mass, Breast Pain, Nipple Discharge and Skin Changes. Gastrointestinal Present- Nausea. Not Present- Abdominal Pain, Bloating, Bloody Stool, Change in Bowel Habits, Chronic diarrhea, Constipation, Difficulty Swallowing, Excessive gas, Gets full quickly at meals, Hemorrhoids, Indigestion, Rectal Pain and Vomiting. Musculoskeletal Present- Back Pain. Not Present- Joint Pain, Joint Stiffness, Muscle Pain, Muscle Weakness and Swelling of Extremities. Neurological Present- Headaches and Weakness. Not Present- Decreased Memory, Fainting, Numbness, Seizures, Tingling, Tremor and Trouble walking. Psychiatric Not Present- Anxiety, Bipolar, Change in Sleep Pattern, Depression, Fearful and Frequent crying. All other systems negative  BP (!) 108/59   Pulse 79   Temp 97.8 F (36.6 C) (Oral)   Resp 18   Ht 5\' 1"  (1.549 m)   Wt 75 kg   LMP 02/10/2018   SpO2 98%   BMI 31.24 kg/m      Physical Exam  The physical exam findings are as follows: Note:Constitutional: No acute distress, conversant, appears stated age  Eyes: Anicteric sclerae, moist conjunctiva, no lid lag  Neck:trachea midline,  no cervical lymphadenopathy, left thyroid enlargement  Lungs: Clear to auscultation  biilaterally, normal respiratory effot  Cardiovascular: regular rate & rhythm, no murmurs, no peripheal edema, pedal pulses 2+  GI: Soft, no masses or hepatosplenomegaly, non-tender to palpation  MSK: Normal gait, no clubbing cyanosis, edema  Skin: No rashes, palpation reveals normal skin turgor  Psychiatric: Appropriate judgment and insight, oriented to person, place, and time ,    Assessment & Plan ( THYROID NODULE (E04.1) Impression: 32 year old female with left thyroid nodule patient also has signs and symptoms of dysphagia and asphyxiation when laying down. 1. Will proceed to the operating room for left thyroidectomy.  All risks and benefits were discussed with the patient to generally include: infection, bleeding, damage to the recurrent laryngeal nerve, damage to parathyroid glands, and possible need for further surgery. Alternatives were offered and described. All questions were answered and the patient voiced understanding of the procedure and wishes to proceed.

## 2018-02-10 NOTE — Care Management Note (Signed)
Case Management Note  Patient Details  Name: Debra Mills MRN: [id number] Date of Birth: Feb 09, 1986  Subjective/Objective:                    Action/Plan:  From home 02/10/18 s/p LEFT THYROIDECTOMY , has PCP and prescription coverage. Will continue to follow for discharge needs. Expected Discharge Date:                  Expected Discharge Plan:  Home/Self Care  In-House Referral:     Discharge planning Services     Post Acute Care Choice:    Choice offered to:     DME Arranged:    DME Agency:     HH Arranged:    HH Agency:     Status of Service:  In process, will continue to follow  If discussed at Long Length of Stay Meetings, dates discussed:    Additional Comments:  Marilu Favre, RN 02/10/2018, 10:44 AM

## 2018-02-10 NOTE — Transfer of Care (Signed)
Immediate Anesthesia Transfer of Care Note  Patient: Debra Mills  Procedure(s) Performed: LEFT THYROIDECTOMY (Left Neck)  Patient Location: PACU  Anesthesia Type:General  Level of Consciousness: awake, alert  and oriented  Airway & Oxygen Therapy: Patient Spontanous Breathing and Patient connected to nasal cannula oxygen  Post-op Assessment: Report given to RN, Post -op Vital signs reviewed and stable and Patient moving all extremities  Post vital signs: Reviewed and stable  Last Vitals:  Vitals Value Taken Time  BP 124/61 02/10/2018  9:44 AM  Temp 36.8 C 02/10/2018  9:44 AM  Pulse 90 02/10/2018  9:48 AM  Resp 17 02/10/2018  9:48 AM  SpO2 99 % 02/10/2018  9:48 AM  Vitals shown include unvalidated device data.  Last Pain:  Vitals:   02/10/18 0614  TempSrc:   PainSc: 0-No pain         Complications: No apparent anesthesia complications

## 2018-02-10 NOTE — Discharge Instructions (Signed)
Thyroidectomy, Care After °Refer to this sheet in the next few weeks. These instructions provide you with information about caring for yourself after your procedure. Your health care provider may also give you more specific instructions. Your treatment has been planned according to current medical practices, but problems sometimes occur. Call your health care provider if you have any problems or questions after your procedure. °What can I expect after the procedure? °After your procedure, it is typical to have: °· Mild pain in the neck or upper body, especially when swallowing. °· A sore throat. °· A weak voice. ° °Follow these instructions at home: °· Take medicines only as directed by your health care provider. °· If your entire thyroid gland was removed, you may need to take thyroid hormone medicine from now on. °· Do not take medicines that contain aspirin and ibuprofen until your health care provider says that you can. These medicines can increase your risk of bleeding. °· Some pain medicines cause constipation. Drink enough fluid to keep your urine clear or pale yellow. This can help to prevent constipation. °· Start slowly with eating. You may need to have only liquids and soft foods for a few days or as directed by your health care provider. °· Do not take baths, swim, or use a hot tub until your health care provider approves. °· There are many different ways to close and cover an incision, including stitches (sutures), skin glue, and adhesive strips. Follow your health care provider's instructions for: °? Incision care. °? Bandage (dressing) changes and removal. °? Incision closure removal. °· Resume your usual activities as directed by your health care provider. °· For the first 10 days after the procedure or as instructed by your health care provider: °? Do not lift anything heavier than 20 lb (9.1 kg). °? Do not jog, swim, or do other strenuous exercises. °? Do not play contact sports. °· Keep all  follow-up visits as directed by your health care provider. This is important. °Contact a health care provider if: °· The soreness in your throat gets worse. °· You have increased pain at your incision or incisions. °· You have increased bleeding from an incision. °· Your incision becomes infected. Watch for: °? Swelling. °? Redness. °? Warmth. °? Pus. °· You notice a bad smell coming from an incision or dressing. °· You have a fever. °· You feel lightheaded or faint. °· You have numbness, tingling, or muscle spasms in your: °? Arms. °? Hands. °? Feet. °? Face. °· You have trouble swallowing. °Get help right away if: °· You develop a rash. °· You have difficulty breathing. °· You hear whistling noises coming from your chest. °· You develop a cough that gets worse. °· Your speech changes, or you have hoarseness that gets worse. °This information is not intended to replace advice given to you by your health care provider. Make sure you discuss any questions you have with your health care provider. °Document Released: 11/09/2004 Document Revised: 12/24/2015 Document Reviewed: 09/22/2013 °Elsevier Interactive Patient Education © 2018 [employer] ° °

## 2018-02-10 NOTE — Op Note (Signed)
02/10/2018  9:29 AM  PATIENT:  Debra Mills  32 y.o. female  PRE-OPERATIVE DIAGNOSIS:  LEFT THYROID NODULE  POST-OPERATIVE DIAGNOSIS:  LEFT THYROID NODULE  PROCEDURE:  Procedure(s): LEFT THYROIDECTOMY (Left)  SURGEON:  Surgeon(s) and Role:    Ralene Ok, MD - Primary    * Fanny Skates, MD - Assisting  ANESTHESIA:   local and general  EBL:  25cc   BLOOD ADMINISTERED:none  DRAINS: none   LOCAL MEDICATIONS USED:  BUPIVICAINE   SPECIMEN:  Source of Specimen:  Left thyroid lobe, with stitch in left superior lobe  DISPOSITION OF SPECIMEN:  PATHOLOGY  COUNTS:  YES  TOURNIQUET:  * No tourniquets in log *  DICTATION: .Dragon Dictation  Findings: Large left sided lobe  Indications for procedure: The patient is a 13F who had a left thyroid nodule with signs of dysphagia and asphyxia with laying down.   Details of the procedure:  The patient was taken back to the operating room. The patient was placed in supine position with bilateral SCDs in place.  The patient was prepped and draped in the usual sterile fashion. After appropriate anitbiotics were confirmed, a time-out was confirmed and all facts were verified.   A 4 cm incision was made approximately 2 fingerbreadths above the sternal notch. Bovie cautery was used to maintain hemostasis dissection was carried down through the platysma. The platysma was elevated and flaps were created superiorly and inferiorly to the thyroid cartilage as well as the sternal notch, repsectively. The strap muscles were identified in the midline and separated. left-sided strap muscles were elevated off the anterior surface of the thyroid. This dissection was carried laterally. The middle thyroid vein was identified and doubly ligated. We proceeded to dissect away the superior lobe and Kitners were used to gently dissect the surrounding musculature from the thyroid. The superior thyroid vessels were identified and doubly ligated with clips and  transected with a Harmonic scalpel. At this time this freed up the superior lobe was able to deliver this into the wound. We did not identify the superior parathyroid gland.  There was some venous bleeding that came from the left middle thyroid vessels.   We continued to dissect the thyroid off of the trachea from lateral to medial direction. The left recurrent laryngeal nerve was not identified, however dissection was kept right on the thyroid lobe.  The inferior thyroid vessels were identified ligated with clips. At this time Berry's ligament was dissected away from the trachea. This delivered the left lobe of the thyroid into the wound and the harmonic scalpel was used to divide the thyroid in the midline. A superior stitch was then placed in the superior thyroid lobe.   The area was irrigated out. The dissection bed was hemostatic. We placed fibrillar hemostatic agent into the wound. Strap muscles were then reapproximated in the midline with interrupted 3-0 Vicryl stitches. The platysma was reapproximated using 3-0 Vicryl stitches in interrupted fashion. Skin was then reapproximated using a running subcuticular 4-0 Monocryl. The skin was then dressed with Dermabond. The patient was taken to the recovery room in stable condition.    PLAN OF CARE: Admit for overnight observation  PATIENT DISPOSITION:  PACU - hemodynamically stable.   Delay start of Pharmacological VTE agent (>24hrs) due to surgical blood loss or risk of bleeding: not applicable

## 2018-02-10 NOTE — Anesthesia Procedure Notes (Signed)
Procedure Name: Intubation Date/Time: 02/10/2018 7:58 AM Performed by: Colin Benton, CRNA Pre-anesthesia Checklist: Patient identified, Emergency Drugs available, Suction available and Patient being monitored Patient Re-evaluated:Patient Re-evaluated prior to induction Oxygen Delivery Method: Circle System Utilized Preoxygenation: Pre-oxygenation with 100% oxygen Induction Type: IV induction Ventilation: Mask ventilation without difficulty Laryngoscope Size: Mac and 3 Grade View: Grade I Tube type: Oral Tube size: 7.0 mm Number of attempts: 1 Airway Equipment and Method: Stylet Placement Confirmation: ETT inserted through vocal cords under direct vision,  positive ETCO2 and breath sounds checked- equal and bilateral Secured at: 21 cm Tube secured with: Tape Dental Injury: Teeth and Oropharynx as per pre-operative assessment

## 2018-02-10 NOTE — Anesthesia Postprocedure Evaluation (Signed)
Anesthesia Post Note  Patient: Debra Mills  Procedure(s) Performed: LEFT THYROIDECTOMY (Left Neck)     Patient location during evaluation: PACU Anesthesia Type: General Level of consciousness: awake and alert and oriented Pain management: pain level controlled Vital Signs Assessment: post-procedure vital signs reviewed and stable Respiratory status: spontaneous breathing, nonlabored ventilation and respiratory function stable Cardiovascular status: blood pressure returned to baseline and stable Postop Assessment: no apparent nausea or vomiting Anesthetic complications: no    Last Vitals:  Vitals:   02/10/18 0953 02/10/18 1015  BP: 104/61   Pulse: 86 82  Resp: 15 13  Temp:    SpO2: 97% 96%    Last Pain:  Vitals:   02/10/18 1020  TempSrc:   PainSc: Asleep                 Debra Mills A.

## 2018-02-10 NOTE — Plan of Care (Signed)

## 2018-02-11 ENCOUNTER — Encounter (HOSPITAL_COMMUNITY): Payer: Self-pay | Admitting: General Surgery

## 2018-02-11 DIAGNOSIS — C73 Malignant neoplasm of thyroid gland: Secondary | ICD-10-CM | POA: Diagnosis not present

## 2018-02-11 LAB — BASIC METABOLIC PANEL
Anion gap: 7 (ref 5–15)
BUN: 8 mg/dL (ref 6–20)
CHLORIDE: 106 mmol/L (ref 98–111)
CO2: 24 mmol/L (ref 22–32)
CREATININE: 0.6 mg/dL (ref 0.44–1.00)
Calcium: 9 mg/dL (ref 8.9–10.3)
GFR calc Af Amer: >60 mL/min (ref >60)
GLUCOSE: 138 mg/dL — AB (ref 70–99)
POTASSIUM: 3.6 mmol/L (ref 3.5–5.1)
SODIUM: 137 mmol/L (ref 135–145)

## 2018-02-11 LAB — HIV ANTIBODY (ROUTINE TESTING W REFLEX): HIV SCREEN 4TH GENERATION: NONREACTIVE

## 2018-02-11 MED ORDER — TRAMADOL HCL 50 MG PO TABS: 50.0000 mg | ORAL_TABLET | Freq: Four times a day (QID) | ORAL | 0 refills | Status: DC | PRN

## 2018-02-11 NOTE — Progress Notes (Signed)
Patient given discharge instructions and aware of follow up appointments. Patient verbalized understanding. Patient left unit in stable condition.

## 2018-02-11 NOTE — Plan of Care (Signed)
  Problem: Education: Goal: Knowledge of General Education information will improve Description Including pain rating scale, medication(s)/side effects and non-pharmacologic comfort measures Outcome: Progressing Note:  POC and pain management reviewed with pt.   

## 2018-02-11 NOTE — Discharge Summary (Signed)
Physician Discharge Summary  Patient ID: Debra Mills MRN: [id number] DOB/AGE: 03-Sep-1985 32 y.o.  Admit date: 02/10/2018 Discharge date: 02/11/2018  Admission Diagnoses: thyroid nodule  Discharge Diagnoses:  Active Problems:   S/P thyroidectomy   Discharged Condition: good  Hospital Course: Pt underwent left thyroid lobectomy.  Please see op note for full details.  Post op pt was sent to the floor and started on a liq diet.  She was tolerating that well.  Pt had good pain control.  She was ambulating well on her own.  POD 1 labs showed a normal Ca level.  She was deemed stable for Dc and Dc'd home  Consults: None  Significant Diagnostic Studies: none  Treatments: surgery: as above  Discharge Exam: Blood pressure 105/66, pulse 79, temperature 98.2 F (36.8 C), temperature source Oral, resp. rate 16, height 5\' 1"  (1.549 m), weight 75 kg, last menstrual period 02/10/2018, SpO2 97 %. General appearance: alert and cooperative Neck: inc c/d/i, no hematoma or drainage  Disposition: Discharge disposition: 01-Home or Self Care       Discharge Instructions    Diet - low sodium heart healthy   Complete by:  As directed    Increase activity slowly   Complete by:  As directed      Allergies as of 02/11/2018      Reactions   Fish Allergy Hives      Medication List    TAKE these medications   acetaminophen 500 MG tablet Commonly known as:  TYLENOL Take 1,000 mg by mouth every 6 (six) hours as needed for moderate pain.   docusate sodium 100 MG capsule Commonly known as:  COLACE Take 1 capsule (100 mg total) by mouth 2 (two) times daily.   ibuprofen 600 MG tablet Commonly known as:  ADVIL,MOTRIN Take 1 tablet (600 mg total) by mouth every 6 (six) hours.   ibuprofen 600 MG tablet Commonly known as:  ADVIL,MOTRIN Take 1 tablet (600 mg total) by mouth 3 (three) times daily.   meloxicam 7.5 MG tablet Commonly known as:  MOBIC Take 1 tablet (7.5 mg total) by mouth daily.   methocarbamol 500 MG tablet Commonly known as:  ROBAXIN Take 1 tablet (500 mg total) by mouth every 12 (twelve) hours as needed for muscle spasms.   ofloxacin 0.3 % ophthalmic solution Commonly known as:  OCUFLOX 10 drops in each ear once a day for 7 days   oxyCODONE-acetaminophen 5-325 MG tablet Commonly known as:  PERCOCET/ROXICET Take 1-2 tablets by mouth every 4 (four) hours as needed for severe pain.   polyethylene glycol packet Commonly known as:  MIRALAX / GLYCOLAX Take 17 g by mouth daily.   traMADol 50 MG tablet Commonly known as:  ULTRAM Take 1 tablet (50 mg total) by mouth every 6 (six) hours as needed (mild pain).      Follow-up Information    Ralene Ok, MD. Schedule an appointment as soon as possible for a visit in 2 weeks.   Specialty:  General Surgery Why:  For wound re-check Contact information: [place] [address] [phone]           Signed: Ralene Ok 02/11/2018, 6:41 AM

## 2018-03-18 ENCOUNTER — Ambulatory Visit: Payer: Self-pay | Admitting: General Surgery

## 2018-03-18 NOTE — H&P (Signed)
  History of Present Illness Ralene Ok MD; 03/18/2018 2:00 PM) The patient is a 32 year old female presenting for a post-operative visit. Patient is a 32 year old female who comes in today for first postoperative visit. I did discuss with her that we were trying to get a hold of her over the phone on several occasions to discuss her pathology. Patient has been doing well from surgery without any dysphagia or pain. Patient's pathology revealed papillary thyroid carcinoma, follicular variant. Size was 7.5 cm I did discuss this with the patient and her husband.   Allergies Emeline Gins, [place]; 03/18/2018 1:51 PM) No Known Drug Allergies [01/09/2018]: Allergies Reconciled   Medication History Emeline Gins, [place]; 03/18/2018 1:51 PM) No Current Medications Medications Reconciled  Vitals Emeline Gins CMA; 03/18/2018 1:50 PM) 03/18/2018 1:50 PM Weight: 169.38 lb Height: 61in Body Surface Area: 1.76 m Body Mass Index: 32 kg/m  Temp.: 97.40F  Pulse: 104 (Regular)  BP: 130/80 (Sitting, Left Arm, Standard)       Physical Exam Ralene Ok MD; 03/18/2018 2:00 PM) The physical exam findings are as follows: Note:Constitutional: No acute distress, conversant, appears stated age  Eyes: Anicteric sclerae, moist conjunctiva, no lid lag  Neck: No thyromegaly, trachea midline, no cervical lymphadenopathy, incision is well-healed.  Lungs: Clear to auscultation biilaterally, normal respiratory effot  Cardiovascular: regular rate & rhythm, no murmurs, no peripheal edema, pedal pulses 2+  GI: Soft, no masses or hepatosplenomegaly, non-tender to palpation  MSK: Normal gait, no clubbing cyanosis, edema  Skin: No rashes, palpation reveals normal skin turgor  Psychiatric: Appropriate judgment and insight, oriented to person, place, and time    Assessment & Plan Ralene Ok MD; 03/18/2018 2:01 PM) PAPILLARY THYROID CARCINOMA (C73) Impression:  Patient is a 32 year old female with papillary thyroid carcinoma, follicular pattern, at 7.5 cm. 1. I discussed with the patient that since this is acting like a more aggressive thyroid cancer as well as the size I would recommend at this time completion thyroidectomy. Thereafter the patient would likely be a candidate for radioactive iodine.  2. We will schedule her for completion right side thyroidectomy. 3. All risks and benefits were discussed with the patient to generally include: infection, bleeding, damage to the recurrent laryngeal nerve, damage to parathyroid glands, and possible need for further surgery. Alternatives were offered and described. All questions were answered and the patient voiced understanding of the procedure and wishes to proceed.

## 2018-04-13 NOTE — Patient Instructions (Addendum)
Debra Mills  04/13/2018   Your procedure is scheduled on: 04-21-18   Report to [facility] Main  Entrance     Report to admitting at 7:45AM    Call this number if you have problems the morning of surgery [phone]     Remember: Do not eat food or drink liquids :After Midnight. BRUSH YOUR TEETH MORNING OF SURGERY AND RINSE YOUR MOUTH OUT, NO CHEWING GUM CANDY OR MINTS.     Take these medicines the morning of surgery with A SIP OF WATER: tylenol if needed                                You may not have any metal on your body including hair pins and              piercings  Do not wear jewelry, make-up, lotions, powders or perfumes, deodorant             Do not wear nail polish.  Do not shave  48 hours prior to surgery.                 Do not bring valuables to the hospital. [place].  Contacts, dentures or bridgework may not be worn into surgery.       Patients discharged the day of surgery will not be allowed to drive home.  Name and phone number of your driver:  Special Instructions: N/A              Please read over the following fact sheets you were given: _____________________________________________________________________             [facility] - Preparing for Surgery Before surgery, you can play an important role.  Because skin is not sterile, your skin needs to be as free of germs as possible.  You can reduce the number of germs on your skin by washing with CHG (chlorahexidine gluconate) soap before surgery.  CHG is an antiseptic cleaner which kills germs and bonds with the skin to continue killing germs even after washing. Please DO NOT use if you have an allergy to CHG or antibacterial soaps.  If your skin becomes reddened/irritated stop using the CHG and inform your nurse when you arrive at Short Stay. Do not shave (including legs and underarms) for at least 48 hours prior to the first  CHG shower.  You may shave your face/neck. Please follow these instructions carefully:  1.  Shower with CHG Soap the night before surgery and the  morning of Surgery.  2.  If you choose to wash your hair, wash your hair first as usual with your  normal  shampoo.  3.  After you shampoo, rinse your hair and body thoroughly to remove the  shampoo.                           4.  Use CHG as you would any other liquid soap.  You can apply chg directly  to the skin and wash                       Gently with a scrungie or clean washcloth.  5.  Apply the CHG Soap to your body ONLY FROM THE NECK DOWN.   Do not use on face/ open                           Wound or open sores. Avoid contact with eyes, ears mouth and genitals (private parts).                       Wash face,  Genitals (private parts) with your normal soap.             6.  Wash thoroughly, paying special attention to the area where your surgery  will be performed.  7.  Thoroughly rinse your body with warm water from the neck down.  8.  DO NOT shower/wash with your normal soap after using and rinsing off  the CHG Soap.                9.  Pat yourself dry with a clean towel.            10.  Wear clean pajamas.            11.  Place clean sheets on your bed the night of your first shower and do not  sleep with pets. Day of Surgery : Do not apply any lotions/deodorants the morning of surgery.  Please wear clean clothes to the hospital/surgery center.  FAILURE TO FOLLOW THESE INSTRUCTIONS MAY RESULT IN THE CANCELLATION OF YOUR SURGERY PATIENT SIGNATURE_________________________________  NURSE SIGNATURE__________________________________  ________________________________________________________________________

## 2018-04-13 NOTE — Progress Notes (Signed)
cxr 02-03-18 epic

## 2018-04-15 ENCOUNTER — Ambulatory Visit (HOSPITAL_COMMUNITY)
Admission: RE | Admit: 2018-04-15 | Discharge: 2018-04-15 | Disposition: A | Discharge status: Home / Self Care | Payer: Medicaid Other | Source: Ambulatory Visit | Attending: Anesthesiology | Admitting: Anesthesiology

## 2018-04-15 ENCOUNTER — Other Ambulatory Visit: Payer: Self-pay

## 2018-04-15 ENCOUNTER — Encounter (HOSPITAL_COMMUNITY): Payer: Self-pay

## 2018-04-15 ENCOUNTER — Encounter (HOSPITAL_COMMUNITY)
Admission: RE | Admit: 2018-04-15 | Discharge: 2018-04-15 | Disposition: A | Discharge status: Home / Self Care | Payer: Medicaid Other | Source: Ambulatory Visit | Attending: General Surgery | Admitting: General Surgery

## 2018-04-15 DIAGNOSIS — Z01811 Encounter for preprocedural respiratory examination: Secondary | ICD-10-CM | POA: Insufficient documentation

## 2018-04-15 HISTORY — DX: Dizziness and giddiness: R42

## 2018-04-15 LAB — CBC
HCT: 39.2 % (ref 36.0–46.0)
Hemoglobin: 12.5 g/dL (ref 12.0–15.0)
MCH: 25.6 pg — ABNORMAL LOW (ref 26.0–34.0)
MCHC: 31.9 g/dL (ref 30.0–36.0)
MCV: 80.3 fL (ref 80.0–100.0)
PLATELETS: 231 10*3/uL (ref 150–400)
RBC: 4.88 MIL/uL (ref 3.87–5.11)
RDW: 12.9 % (ref 11.5–15.5)
WBC: 10 10*3/uL (ref 4.0–10.5)
nRBC: 0 % (ref 0.0–0.2)

## 2018-04-21 ENCOUNTER — Encounter (HOSPITAL_COMMUNITY)
Admission: RE | Disposition: A | Discharge status: Home / Self Care | Payer: Self-pay | Source: Home / Self Care | Attending: General Surgery

## 2018-04-21 ENCOUNTER — Encounter (HOSPITAL_COMMUNITY): Payer: Self-pay | Admitting: *Deleted

## 2018-04-21 ENCOUNTER — Other Ambulatory Visit: Payer: Self-pay

## 2018-04-21 ENCOUNTER — Ambulatory Visit (HOSPITAL_COMMUNITY): Payer: Medicaid Other | Admitting: Registered Nurse

## 2018-04-21 ENCOUNTER — Observation Stay (HOSPITAL_COMMUNITY)
Admission: RE | Admit: 2018-04-21 | Discharge: 2018-04-22 | Disposition: A | Discharge status: Home / Self Care | Payer: Medicaid Other | Attending: General Surgery | Admitting: General Surgery

## 2018-04-21 DIAGNOSIS — Z9089 Acquired absence of other organs: Secondary | ICD-10-CM

## 2018-04-21 DIAGNOSIS — E063 Autoimmune thyroiditis: Secondary | ICD-10-CM | POA: Insufficient documentation

## 2018-04-21 DIAGNOSIS — E890 Postprocedural hypothyroidism: Secondary | ICD-10-CM

## 2018-04-21 DIAGNOSIS — C73 Malignant neoplasm of thyroid gland: Principal | ICD-10-CM | POA: Insufficient documentation

## 2018-04-21 DIAGNOSIS — Z9889 Other specified postprocedural states: Secondary | ICD-10-CM

## 2018-04-21 HISTORY — PX: THYROID LOBECTOMY: SHX420

## 2018-04-21 LAB — PREGNANCY, URINE: Preg Test, Ur: NEGATIVE

## 2018-04-21 SURGERY — LOBECTOMY, THYROID
Anesthesia: General | Laterality: Right

## 2018-04-21 MED ORDER — FENTANYL CITRATE (PF) 250 MCG/5ML IJ SOLN
INTRAMUSCULAR | Status: DC | PRN
  Administered 2018-04-21 (×5): 50 ug via INTRAVENOUS

## 2018-04-21 MED ORDER — SCOPOLAMINE 1 MG/3DAYS TD PT72
1.0000 | MEDICATED_PATCH | TRANSDERMAL | Status: DC
  Administered 2018-04-21: 1 via TRANSDERMAL

## 2018-04-21 MED ORDER — DEXAMETHASONE SODIUM PHOSPHATE 10 MG/ML IJ SOLN
INTRAMUSCULAR | Status: AC
  Filled 2018-04-21: qty 1

## 2018-04-21 MED ORDER — GABAPENTIN 300 MG PO CAPS
300.0000 mg | ORAL_CAPSULE | ORAL | Status: AC
  Administered 2018-04-21: 300 mg via ORAL
  Filled 2018-04-21: qty 1

## 2018-04-21 MED ORDER — ROCURONIUM BROMIDE 10 MG/ML (PF) SYRINGE
PREFILLED_SYRINGE | INTRAVENOUS | Status: AC
  Filled 2018-04-21: qty 10

## 2018-04-21 MED ORDER — SCOPOLAMINE 1 MG/3DAYS TD PT72
MEDICATED_PATCH | TRANSDERMAL | Status: AC
  Filled 2018-04-21: qty 1

## 2018-04-21 MED ORDER — CELECOXIB 200 MG PO CAPS
200.0000 mg | ORAL_CAPSULE | Freq: Two times a day (BID) | ORAL | Status: DC
  Administered 2018-04-21 – 2018-04-22 (×3): 200 mg via ORAL
  Filled 2018-04-21 (×3): qty 1

## 2018-04-21 MED ORDER — SUCCINYLCHOLINE CHLORIDE 200 MG/10ML IV SOSY
PREFILLED_SYRINGE | INTRAVENOUS | Status: DC | PRN
  Administered 2018-04-21: 120 mg via INTRAVENOUS

## 2018-04-21 MED ORDER — DEXAMETHASONE SODIUM PHOSPHATE 10 MG/ML IJ SOLN
INTRAMUSCULAR | Status: DC | PRN
  Administered 2018-04-21: 10 mg via INTRAVENOUS

## 2018-04-21 MED ORDER — LACTATED RINGERS IV SOLN: INTRAVENOUS | Status: DC

## 2018-04-21 MED ORDER — ONDANSETRON HCL 4 MG/2ML IJ SOLN
INTRAMUSCULAR | Status: DC | PRN
  Administered 2018-04-21: 4 mg via INTRAVENOUS

## 2018-04-21 MED ORDER — SUGAMMADEX SODIUM 200 MG/2ML IV SOLN
INTRAVENOUS | Status: DC | PRN
  Administered 2018-04-21: 200 mg via INTRAVENOUS

## 2018-04-21 MED ORDER — ONDANSETRON HCL 4 MG/2ML IJ SOLN
INTRAMUSCULAR | Status: AC
  Filled 2018-04-21: qty 2

## 2018-04-21 MED ORDER — CEFAZOLIN SODIUM-DEXTROSE 2-4 GM/100ML-% IV SOLN
2.0000 g | INTRAVENOUS | Status: AC
  Administered 2018-04-21: 2 g via INTRAVENOUS
  Filled 2018-04-21: qty 100

## 2018-04-21 MED ORDER — PROPOFOL 10 MG/ML IV BOLUS
INTRAVENOUS | Status: AC
  Filled 2018-04-21: qty 20

## 2018-04-21 MED ORDER — 0.9 % SODIUM CHLORIDE (POUR BTL) OPTIME
TOPICAL | Status: DC | PRN
  Administered 2018-04-21: 1000 mL

## 2018-04-21 MED ORDER — ACETAMINOPHEN 500 MG PO TABS
1000.0000 mg | ORAL_TABLET | ORAL | Status: AC
  Administered 2018-04-21: 1000 mg via ORAL
  Filled 2018-04-21: qty 2

## 2018-04-21 MED ORDER — MEPERIDINE HCL 50 MG/ML IJ SOLN: 6.2500 mg | INTRAMUSCULAR | Status: DC | PRN

## 2018-04-21 MED ORDER — MIDAZOLAM HCL 2 MG/2ML IJ SOLN
INTRAMUSCULAR | Status: AC
  Filled 2018-04-21: qty 2

## 2018-04-21 MED ORDER — FENTANYL CITRATE (PF) 100 MCG/2ML IJ SOLN
INTRAMUSCULAR | Status: DC | PRN
  Administered 2018-04-21: 50 ug via INTRAVENOUS

## 2018-04-21 MED ORDER — KETOROLAC TROMETHAMINE 30 MG/ML IJ SOLN
30.0000 mg | Freq: Four times a day (QID) | INTRAMUSCULAR | Status: DC | PRN
  Administered 2018-04-21 (×2): 30 mg via INTRAVENOUS
  Filled 2018-04-21 (×2): qty 1

## 2018-04-21 MED ORDER — ONDANSETRON 4 MG PO TBDP: 4.0000 mg | ORAL_TABLET | Freq: Four times a day (QID) | ORAL | Status: DC | PRN

## 2018-04-21 MED ORDER — PHENYLEPHRINE 40 MCG/ML (10ML) SYRINGE FOR IV PUSH (FOR BLOOD PRESSURE SUPPORT)
PREFILLED_SYRINGE | INTRAVENOUS | Status: DC | PRN
  Administered 2018-04-21: 120 ug via INTRAVENOUS
  Administered 2018-04-21: 80 ug via INTRAVENOUS
  Administered 2018-04-21: 120 ug via INTRAVENOUS

## 2018-04-21 MED ORDER — CHLORHEXIDINE GLUCONATE CLOTH 2 % EX PADS: 6.0000 | MEDICATED_PAD | Freq: Once | CUTANEOUS | Status: DC

## 2018-04-21 MED ORDER — BUPIVACAINE-EPINEPHRINE 0.25% -1:200000 IJ SOLN
INTRAMUSCULAR | Status: DC | PRN
  Administered 2018-04-21: 7 mL

## 2018-04-21 MED ORDER — METOCLOPRAMIDE HCL 5 MG/ML IJ SOLN: 10.0000 mg | Freq: Once | INTRAMUSCULAR | Status: DC | PRN

## 2018-04-21 MED ORDER — MIDAZOLAM HCL 2 MG/2ML IJ SOLN
INTRAMUSCULAR | Status: DC | PRN
  Administered 2018-04-21: 1 mg via INTRAVENOUS
  Administered 2018-04-21: 0.5 mg via INTRAVENOUS
  Administered 2018-04-21: 1 mg via INTRAVENOUS

## 2018-04-21 MED ORDER — ONDANSETRON HCL 4 MG/2ML IJ SOLN: 4.0000 mg | Freq: Four times a day (QID) | INTRAMUSCULAR | Status: DC | PRN

## 2018-04-21 MED ORDER — GABAPENTIN 300 MG PO CAPS
300.0000 mg | ORAL_CAPSULE | Freq: Two times a day (BID) | ORAL | Status: DC
  Administered 2018-04-21 – 2018-04-22 (×3): 300 mg via ORAL
  Filled 2018-04-21 (×3): qty 1

## 2018-04-21 MED ORDER — LIDOCAINE 2% (20 MG/ML) 5 ML SYRINGE
INTRAMUSCULAR | Status: AC
  Filled 2018-04-21: qty 5

## 2018-04-21 MED ORDER — CELECOXIB 200 MG PO CAPS
200.0000 mg | ORAL_CAPSULE | ORAL | Status: AC
  Administered 2018-04-21: 200 mg via ORAL
  Filled 2018-04-21: qty 1

## 2018-04-21 MED ORDER — BUPIVACAINE-EPINEPHRINE (PF) 0.25% -1:200000 IJ SOLN
INTRAMUSCULAR | Status: AC
  Filled 2018-04-21: qty 30

## 2018-04-21 MED ORDER — FENTANYL CITRATE (PF) 100 MCG/2ML IJ SOLN
INTRAMUSCULAR | Status: AC
  Filled 2018-04-21: qty 2

## 2018-04-21 MED ORDER — DEXTROSE-NACL 5-0.9 % IV SOLN
INTRAVENOUS | Status: DC
  Administered 2018-04-21 – 2018-04-22 (×2): via INTRAVENOUS

## 2018-04-21 MED ORDER — TRAMADOL HCL 50 MG PO TABS
50.0000 mg | ORAL_TABLET | Freq: Four times a day (QID) | ORAL | Status: DC | PRN
  Administered 2018-04-22: 50 mg via ORAL
  Filled 2018-04-21: qty 1

## 2018-04-21 MED ORDER — FENTANYL CITRATE (PF) 250 MCG/5ML IJ SOLN
INTRAMUSCULAR | Status: AC
  Filled 2018-04-21: qty 5

## 2018-04-21 MED ORDER — ROCURONIUM BROMIDE 10 MG/ML (PF) SYRINGE
PREFILLED_SYRINGE | INTRAVENOUS | Status: DC | PRN
  Administered 2018-04-21: 10 mg via INTRAVENOUS
  Administered 2018-04-21: 40 mg via INTRAVENOUS

## 2018-04-21 MED ORDER — PHENOL 1.4 % MT LIQD: 1.0000 | OROMUCOSAL | Status: DC | PRN

## 2018-04-21 MED ORDER — FENTANYL CITRATE (PF) 100 MCG/2ML IJ SOLN
25.0000 ug | INTRAMUSCULAR | Status: DC | PRN
  Administered 2018-04-21 (×2): 50 ug via INTRAVENOUS

## 2018-04-21 MED ORDER — PROPOFOL 10 MG/ML IV BOLUS
INTRAVENOUS | Status: DC | PRN
  Administered 2018-04-21: 200 mg via INTRAVENOUS

## 2018-04-21 MED ORDER — PROPOFOL 10 MG/ML IV BOLUS
INTRAVENOUS | Status: AC
  Filled 2018-04-21: qty 40

## 2018-04-21 MED ORDER — LACTATED RINGERS IV SOLN
INTRAVENOUS | Status: DC
  Administered 2018-04-21 (×2): via INTRAVENOUS

## 2018-04-21 MED ORDER — LIDOCAINE 2% (20 MG/ML) 5 ML SYRINGE
INTRAMUSCULAR | Status: DC | PRN
  Administered 2018-04-21: 80 mg via INTRAVENOUS

## 2018-04-21 SURGICAL SUPPLY — 39 items
ATTRACTOMAT 16X20 MAGNETIC DRP (DRAPES) ×3 IMPLANT
BLADE HEX COATED 2.75 (ELECTRODE) ×3 IMPLANT
BLADE SURG 15 STRL LF DISP TIS (BLADE) ×1 IMPLANT
BLADE SURG 15 STRL SS (BLADE) ×2
CHLORAPREP W/TINT 26ML (MISCELLANEOUS) ×3 IMPLANT
CLIP VESOCCLUDE MED 6/CT (CLIP) ×9 IMPLANT
CLIP VESOCCLUDE SM WIDE 6/CT (CLIP) ×15 IMPLANT
DERMABOND ADVANCED (GAUZE/BANDAGES/DRESSINGS) ×4
DERMABOND ADVANCED .7 DNX12 (GAUZE/BANDAGES/DRESSINGS) ×2 IMPLANT
DISSECTOR ROUND CHERRY 3/8 STR (MISCELLANEOUS) ×3 IMPLANT
DRAPE LAPAROTOMY T 98X78 PEDS (DRAPES) ×3 IMPLANT
ELECT PENCIL ROCKER SW 15FT (MISCELLANEOUS) ×3 IMPLANT
ELECT REM PT RETURN 15FT ADLT (MISCELLANEOUS) ×3 IMPLANT
GAUZE 4X4 16PLY RFD (DISPOSABLE) ×6 IMPLANT
GAUZE SPONGE 4X4 12PLY STRL (GAUZE/BANDAGES/DRESSINGS) IMPLANT
GLOVE BIOGEL PI IND STRL 7.0 (GLOVE) ×1 IMPLANT
GLOVE BIOGEL PI INDICATOR 7.0 (GLOVE) ×2
GLOVE SURG SIGNA 7.5 PF LTX (GLOVE) ×3 IMPLANT
GLOVE SURG SS PI 7.0 STRL IVOR (GLOVE) ×3 IMPLANT
GOWN STRL REUS W/TWL XL LVL3 (GOWN DISPOSABLE) ×9 IMPLANT
HEMOSTAT SURGICEL 2X4 FIBR (HEMOSTASIS) ×3 IMPLANT
ILLUMINATOR WAVEGUIDE N/F (MISCELLANEOUS) ×3 IMPLANT
KIT BASIN OR (CUSTOM PROCEDURE TRAY) ×3 IMPLANT
NEEDLE HYPO 25X1 1.5 SAFETY (NEEDLE) ×3 IMPLANT
PACK BASIC VI WITH GOWN DISP (CUSTOM PROCEDURE TRAY) ×3 IMPLANT
SHEARS HARMONIC 9CM CVD (BLADE) ×3 IMPLANT
STAPLER VISISTAT 35W (STAPLE) ×3 IMPLANT
SUT ETHIBOND CT1 BRD #0 30IN (SUTURE) IMPLANT
SUT MNCRL AB 4-0 PS2 18 (SUTURE) ×3 IMPLANT
SUT SILK 2 0 (SUTURE)
SUT SILK 2-0 18XBRD TIE 12 (SUTURE) IMPLANT
SUT SILK 3 0 (SUTURE)
SUT SILK 3-0 18XBRD TIE 12 (SUTURE) IMPLANT
SUT VIC AB 3-0 SH 18 (SUTURE) ×6 IMPLANT
SYR BULB IRRIGATION 50ML (SYRINGE) ×3 IMPLANT
SYR CONTROL 10ML LL (SYRINGE) ×3 IMPLANT
TOWEL OR 17X26 10 PK STRL BLUE (TOWEL DISPOSABLE) ×3 IMPLANT
TOWEL OR NON WOVEN STRL DISP B (DISPOSABLE) ×3 IMPLANT
YANKAUER SUCT BULB TIP 10FT TU (MISCELLANEOUS) ×3 IMPLANT

## 2018-04-21 NOTE — Anesthesia Preprocedure Evaluation (Signed)
Anesthesia Evaluation  Patient identified by MRN, date of birth, ID band Patient awake    Reviewed: Allergy & Precautions, NPO status , Patient's Chart, lab work & pertinent test results  Airway Mallampati: II  TM Distance: >3 FB Neck ROM: Full    Dental no notable dental hx.    Pulmonary neg pulmonary ROS,    Pulmonary exam normal breath sounds clear to auscultation       Cardiovascular negative cardio ROS Normal cardiovascular exam Rhythm:Regular Rate:Normal     Neuro/Psych negative neurological ROS  negative psych ROS   GI/Hepatic negative GI ROS, Neg liver ROS,   Endo/Other  negative endocrine ROSdiabetes  Renal/GU negative Renal ROS  negative genitourinary   Musculoskeletal negative musculoskeletal ROS (+)   Abdominal   Peds negative pediatric ROS (+)  Hematology negative hematology ROS (+)   Anesthesia Other Findings   Reproductive/Obstetrics negative OB ROS                             Anesthesia Physical Anesthesia Plan  ASA: II  Anesthesia Plan: General   Post-op Pain Management:    Induction: Intravenous  PONV Risk Score and Plan: 3 and Ondansetron, Dexamethasone, Midazolam and Treatment may vary due to age or medical condition  Airway Management Planned: Oral ETT  Additional Equipment:   Intra-op Plan:   Post-operative Plan: Extubation in OR  Informed Consent: I have reviewed the patients History and Physical, chart, labs and discussed the procedure including the risks, benefits and alternatives for the proposed anesthesia with the patient or authorized representative who has indicated his/her understanding and acceptance.   Dental advisory given  Plan Discussed with: CRNA  Anesthesia Plan Comments:         Anesthesia Quick Evaluation

## 2018-04-21 NOTE — H&P (Signed)
Debra Mills is an 32 y.o. female.   Chief Complaint: thyroid nodule HPI: The patient is a 32 year old female presenting for a post-operative visit. Patient is a 32 year old female who comes in today for first postoperative visit. I did discuss with her that we were trying to get a hold of her over the phone on several occasions to discuss her pathology. Patient has been doing well from surgery without any dysphagia or pain. Patient's pathology revealed papillary thyroid carcinoma, follicular variant. Size was 7.5 cm I did discuss this with the patient and her husband.  Past Medical History:  Diagnosis Date  . Bleeds easily ([place])    when she has a cut.  . Dizziness    on occasion : none today had some dizzines after thyroid surgery this past october   . Dyspnea   . Gestational diabetes   . Goiter   . Headache     Past Surgical History:  Procedure Laterality Date  . CESAREAN SECTION N/A 09/28/2014   Procedure: CESAREAN SECTION;  Surgeon: Guss Bunde, MD;  Location: [facility];  Service: Obstetrics;  Laterality: N/A;  . CESAREAN SECTION N/A 10/12/2016   Procedure: REPEAT CESAREAN SECTION;  Surgeon: Guss Bunde, MD;  Location: [facility];  Service: Obstetrics;  Laterality: N/A;  . THYROIDECTOMY Left 02/10/2018  . THYROIDECTOMY Left 02/10/2018   Procedure: LEFT THYROIDECTOMY;  Surgeon: Ralene Ok, MD;  Location: [facility];  Service: General;  Laterality: Left;    Family History  Problem Relation Age of Onset  . Diabetes Father   . Kidney disease Father        benign tumor removed 1 kidney  . Cancer Father   . Thyroid disease Father   . Birth defects Brother        heart defect   Social History:  reports that she has never smoked. She has never used smokeless tobacco. She reports that she does not drink alcohol or use drugs.  Allergies:  Allergies  Allergen Reactions  . Fish Allergy Hives    Medications Prior to Admission  Medication Sig Dispense Refill  .  acetaminophen (TYLENOL) 500 MG tablet Take 1,000 mg by mouth every 6 (six) hours as needed for moderate pain or headache.       No results found for this or any previous visit (from the past 48 hour(s)). No results found.  Review of Systems  Constitutional: Negative for chills, fever and malaise/fatigue.  HENT: Negative for ear discharge, hearing loss and sore throat.   Eyes: Negative for blurred vision and discharge.  Respiratory: Negative for cough and shortness of breath.   Cardiovascular: Negative for chest pain, orthopnea and leg swelling.  Gastrointestinal: Negative for abdominal pain, constipation, diarrhea, heartburn, nausea and vomiting.  Musculoskeletal: Negative for myalgias and neck pain.  Skin: Negative for itching and rash.  Neurological: Negative for dizziness, focal weakness, seizures and loss of consciousness.  Endo/Heme/Allergies: Negative for environmental allergies. Does not bruise/bleed easily.  Psychiatric/Behavioral: Negative for depression and suicidal ideas.  All other systems reviewed and are negative.   Last menstrual period 04/12/2018. Physical Exam  BP 114/75   Pulse 82   Temp 97.6 F (36.4 C) (Oral)   Resp 16   Ht 5\' 1"  (1.549 m)   Wt 79.4 kg   LMP 04/12/2018   SpO2 100%   BMI 33.07 kg/m   The physical exam findings are as follows: Note:Constitutional: No acute distress, conversant, appears stated age  Eyes: Anicteric sclerae,  moist conjunctiva, no lid lag  Neck: No thyromegaly, trachea midline, no cervical lymphadenopathy, incision is well-healed.  Lungs: Clear to auscultation biilaterally, normal respiratory effot  Cardiovascular: regular rate & rhythm, no murmurs, no peripheal edema, pedal pulses 2+  GI: Soft, no masses or hepatosplenomegaly, non-tender to palpation  MSK: Normal gait, no clubbing cyanosis, edema  Skin: No rashes, palpation reveals normal skin turgor  Psychiatric: Appropriate judgment and insight, oriented  to person, place, and time Assessment/Plan PAPILLARY THYROID CARCINOMA (C73) Impression: Patient is a 32 year old female with papillary thyroid carcinoma, follicular pattern, at 7.5 cm. 1. I discussed with the patient that since this is acting like a more aggressive thyroid cancer as well as the size I would recommend at this time completion thyroidectomy. Thereafter the patient would likely be a candidate for radioactive iodine.  2. We will schedule her for completion right side thyroidectomy. 3. All risks and benefits were discussed with the patient to generally include: infection, bleeding, damage to the recurrent laryngeal nerve, damage to parathyroid glands, and possible need for further surgery. Alternatives were offered and described. All questions were answered and the patient voiced understanding of the procedure and wishes to proceed.  Ralene Ok, MD 04/21/2018, 8:28 AM

## 2018-04-21 NOTE — Discharge Instructions (Signed)
Thyroidectomy, Care After Refer to this sheet in the next few weeks. These instructions provide you with information about caring for yourself after your procedure. Your health care provider may also give you more specific instructions. Your treatment has been planned according to current medical practices, but problems sometimes occur. Call your health care provider if you have any problems or questions after your procedure. What can I expect after the procedure? After your procedure, it is typical to have:  Mild pain in the neck or upper body, especially when swallowing.  A sore throat.  A weak voice.  Follow these instructions at home:  Take medicines only as directed by your health care provider.  If your entire thyroid gland was removed, you may need to take thyroid hormone medicine from now on.  Do not take medicines that contain aspirin and ibuprofen until your health care provider says that you can. These medicines can increase your risk of bleeding.  Some pain medicines cause constipation. Drink enough fluid to keep your urine clear or pale yellow. This can help to prevent constipation.  Start slowly with eating. You may need to have only liquids and soft foods for a few days or as directed by your health care provider.  Do not take baths, swim, or use a hot tub until your health care provider approves.  There are many different ways to close and cover an incision, including stitches (sutures), skin glue, and adhesive strips. Follow your health care provider's instructions for: ? Incision care. ? Bandage (dressing) changes and removal. ? Incision closure removal.  Resume your usual activities as directed by your health care provider.  For the first 10 days after the procedure or as instructed by your health care provider: ? Do not lift anything heavier than 20 lb (9.1 kg). ? Do not jog, swim, or do other strenuous exercises. ? Do not play contact sports.  Keep all  follow-up visits as directed by your health care provider. This is important. Contact a health care provider if:  The soreness in your throat gets worse.  You have increased pain at your incision or incisions.  You have increased bleeding from an incision.  Your incision becomes infected. Watch for: ? Swelling. ? Redness. ? Warmth. ? Pus.  You notice a bad smell coming from an incision or dressing.  You have a fever.  You feel lightheaded or faint.  You have numbness, tingling, or muscle spasms in your: ? Arms. ? Hands. ? Feet. ? Face.  You have trouble swallowing. Get help right away if:  You develop a rash.  You have difficulty breathing.  You hear whistling noises coming from your chest.  You develop a cough that gets worse.  Your speech changes, or you have hoarseness that gets worse. This information is not intended to replace advice given to you by your health care provider. Make sure you discuss any questions you have with your health care provider. Document Released: 11/09/2004 Document Revised: 12/24/2015 Document Reviewed: 09/22/2013 Elsevier Interactive Patient Education  2018 [employer].

## 2018-04-21 NOTE — Anesthesia Procedure Notes (Signed)
Date/Time: 04/21/2018 11:54 AM Performed by: Cynda Familia, CRNA Oxygen Delivery Method: Simple face mask Placement Confirmation: positive ETCO2 and breath sounds checked- equal and bilateral Dental Injury: Teeth and Oropharynx as per pre-operative assessment

## 2018-04-21 NOTE — Op Note (Signed)
04/21/2018  11:46 AM  PATIENT:  Debra Mills  32 y.o. female  PRE-OPERATIVE DIAGNOSIS:  PAPILLARY THYROID CARCINOMA  POST-OPERATIVE DIAGNOSIS:  PAPILLARY THYROID CARCINOMA  PROCEDURE:  Procedure(s): RIGHT THYROID LOBECTOMY (Right)  SURGEON:  Surgeon(s) and Role:    Ralene Ok, MD - Primary    * Coralie Keens, MD - Assisting ANESTHESIA:   local and general  EBL:  10 mL   BLOOD ADMINISTERED:none  DRAINS: none   LOCAL MEDICATIONS USED:  BUPIVICAINE   SPECIMEN:  Source of Specimen:  Right thyroid lobe with stitch marking the right superior lobe  DISPOSITION OF SPECIMEN:  PATHOLOGY  COUNTS:  YES  TOURNIQUET:  * No tourniquets in log *  DICTATION: .Dragon Dictation  Counts: reported as correct x 2    Indications for procedure: The patient is a 85F  who had a left thyroid nodule that was removed and seen to be papillary with follicular variant.  Pt was counseled and taken back for completion thyroidectomy.  Details of the procedure:  The patient was taken back to the operating room. The patient was placed in supine position with bilateral SCDs in place.  The patient was prepped and draped in the usual sterile fashion. After appropriate anitbiotics were confirmed, a time-out was confirmed and all facts were verified.   A 4 cm incision was made approximately 2 fingerbreadths above the sternal notch. Bovie cautery was used to maintain hemostasis dissection was carried down through the platysma. The platysma was elevated and flaps were created superiorly and inferiorly to the thyroid cartilage as well as the sternal notch, repsectively. The strap muscles were identified in the midline and separated. Right-sided strap muscles were elevated off the anterior surface of the thyroid. This dissection was carried laterally. The middle thyroid vein was identified and doubly ligated. We proceeded to dissect away the superior lobe and Kitners were used to gently dissect the  surrounding musculature from the thyroid. The superior thyroid vessels were identified and doubly ligated with clips and transected with a Harmonic scalpel. At this time this freed up the superior lobe was able to deliver this into the wound. We also identified the superior parathyroid gland which we preserved.   We continued to dissect the thyroid off of the trachea from lateral to medial direction. The right recurrent laryngeal nerve was not identified, however dissection was kept right on the thyroid lobe.  The inferior thyroid vessels were identified ligated with clips. At this time Berry's ligament was dissected away from the trachea. This delivered the right lobe of the thyroid into the wound and the harmonic scalpel was used to divide the thyroid in the midline. A superior stitch was then placed in the superior thyroid lobe.   The area was irrigated out. The dissection bed was hemostatic. We placed fibrillar hemostatic agent into the wound. Strap muscles were then reapproximated in the midline with interrupted 3-0 Vicryl stitches. The platysma was reapproximated using 3-0 Vicryl stitches in interrupted fashion. Skin was then reapproximated using a running subcuticular 4-0 Monocryl. The skin was then dressed with Dermabond. The patient was taken to the recovery room in stable condition.    PLAN OF CARE: Admit for overnight observation  PATIENT DISPOSITION:  PACU - hemodynamically stable.   Delay start of Pharmacological VTE agent (>24hrs) due to surgical blood loss or risk of bleeding: yes

## 2018-04-21 NOTE — Plan of Care (Signed)
Pt stable at this time. She is tolerating clear liquids well overall though still is being cautious overall. No nausea to report. Pt reports minimal pain at this time.

## 2018-04-21 NOTE — Transfer of Care (Signed)
Immediate Anesthesia Transfer of Care Note  Patient: Debra Mills  Procedure(s) Performed: RIGHT THYROID LOBECTOMY (Right )  Patient Location: PACU  Anesthesia Type:General  Level of Consciousness: sedated  Airway & Oxygen Therapy: Patient Spontanous Breathing and Patient connected to face mask oxygen  Post-op Assessment: Report given to RN and Post -op Vital signs reviewed and stable  Post vital signs: Reviewed and stable  Last Vitals:  Vitals Value Taken Time  BP 118/83 04/21/2018 12:00 PM  Temp    Pulse 82 04/21/2018 12:02 PM  Resp 12 04/21/2018 12:02 PM  SpO2 100 % 04/21/2018 12:02 PM  Vitals shown include unvalidated device data.  Last Pain:  Vitals:   04/21/18 0838  TempSrc: Oral         Complications: No apparent anesthesia complications

## 2018-04-21 NOTE — Anesthesia Procedure Notes (Signed)
Procedure Name: Intubation Date/Time: 04/21/2018 10:41 AM Performed by: Cynda Familia, CRNA Pre-anesthesia Checklist: Patient identified, Emergency Drugs available, Suction available and Patient being monitored Patient Re-evaluated:Patient Re-evaluated prior to induction Oxygen Delivery Method: Circle System Utilized Preoxygenation: Pre-oxygenation with 100% oxygen Induction Type: IV induction Ventilation: Mask ventilation without difficulty Laryngoscope Size: Miller and 2 Grade View: Grade I Tube type: Oral Number of attempts: 1 Airway Equipment and Method: Stylet Placement Confirmation: ETT inserted through vocal cords under direct vision,  positive ETCO2 and breath sounds checked- equal and bilateral Secured at: 22 cm Tube secured with: Tape Dental Injury: Teeth and Oropharynx as per pre-operative assessment  Comments: Smooth IV Induction Singer-- intubation AM CRNA atraumatic-- after induction and upon mouth opening-- upper teeth with chipping-- pt denied preop and did not report during interview-- teeth unchanged after laryngoscopy-- dental advisory given by CRNA preop-- bilat BS Tobias Alexander

## 2018-04-21 NOTE — Anesthesia Postprocedure Evaluation (Signed)
Anesthesia Post Note  Patient: [place]  Procedure(s) Performed: RIGHT THYROID LOBECTOMY (Right )     Patient location during evaluation: PACU Anesthesia Type: General Level of consciousness: sedated Pain management: pain level controlled Vital Signs Assessment: post-procedure vital signs reviewed and stable Respiratory status: spontaneous breathing and respiratory function stable Cardiovascular status: stable Postop Assessment: no apparent nausea or vomiting Anesthetic complications: no    Last Vitals:  Vitals:   04/21/18 0838 04/21/18 1200  BP: 114/75 118/83  Pulse: 82   Resp: 16   Temp: 36.4 C 36.6 C  SpO2: 100%                   Pauletta Pickney DANIEL

## 2018-04-22 ENCOUNTER — Encounter (HOSPITAL_COMMUNITY): Payer: Self-pay | Admitting: General Surgery

## 2018-04-22 DIAGNOSIS — C73 Malignant neoplasm of thyroid gland: Secondary | ICD-10-CM | POA: Diagnosis not present

## 2018-04-22 LAB — BASIC METABOLIC PANEL
ANION GAP: 8 (ref 5–15)
BUN: 11 mg/dL (ref 6–20)
CO2: 26 mmol/L (ref 22–32)
Calcium: 9 mg/dL (ref 8.9–10.3)
Chloride: 106 mmol/L (ref 98–111)
Creatinine, Ser: 0.59 mg/dL (ref 0.44–1.00)
GFR calc non Af Amer: >60 mL/min (ref >60)
Glucose, Bld: 131 mg/dL — ABNORMAL HIGH (ref 70–99)
Potassium: 3.8 mmol/L (ref 3.5–5.1)
SODIUM: 140 mmol/L (ref 135–145)

## 2018-04-22 MED ORDER — TRAMADOL HCL 50 MG PO TABS: 50.0000 mg | ORAL_TABLET | Freq: Four times a day (QID) | ORAL | 0 refills | Status: AC | PRN

## 2018-04-22 MED ORDER — MAGNESIUM OXIDE -MG SUPPLEMENT 200 MG PO TABS: 200.0000 mg | ORAL_TABLET | Freq: Every day | ORAL | 0 refills | Status: AC

## 2018-04-22 MED ORDER — CALCIUM CARBONATE-VITAMIN D 500-200 MG-UNIT PO TABS: 1.0000 | ORAL_TABLET | Freq: Two times a day (BID) | ORAL | 3 refills | Status: AC

## 2018-04-22 NOTE — Progress Notes (Signed)
Discharge instructions given to pt and all questions were answered. Used interpreter [id number]. Pt taken down via wheelchair and was picked up by her husband.

## 2018-04-22 NOTE — Discharge Summary (Signed)
Physician Discharge Summary  Patient ID: Debra Mills MRN: [id number] DOB/AGE: 03-Dec-1985 32 y.o.  Admit date: 04/21/2018 Discharge date: 04/22/2018  Admission Diagnoses: Papillary thyroid cancer  Discharge Diagnoses:  Active Problems:   S/P thyroidectomy   Discharged Condition: good  Hospital Course: Patient is a 32 year old female who previously underwent left thyroidectomy.  Patient was found to have papillary thyroid cancer.  Patient surgically return for completion thyroidectomy.  Please see operative note for details.  Postoperative patient was sent to the floor.  Patient started liquid diet advance as tolerated.  Patient good pain control.  Patient's calcium level was normal postop day #1.  Patient was deemed stable for discharge and discharged home.  Consults: None  Significant Diagnostic Studies: None  Treatments: Surgery as above  Discharge Exam: Blood pressure 106/71, pulse 66, temperature 98.6 F (37 C), temperature source Oral, resp. rate 16, height 5\' 1"  (1.549 m), weight 79.4 kg, last menstrual period 04/12/2018, SpO2 100 %. General appearance: alert and cooperative Neck: inc c/d/i  Disposition: Discharge disposition: 01-Home or Self Care       Discharge Instructions    Diet - low sodium heart healthy   Complete by:  As directed    Increase activity slowly   Complete by:  As directed      Allergies as of 04/22/2018      Reactions   Fish Allergy Hives      Medication List    TAKE these medications   acetaminophen 500 MG tablet Commonly known as:  TYLENOL Take 1,000 mg by mouth every 6 (six) hours as needed for moderate pain or headache.   calcium-vitamin D 500-200 MG-UNIT tablet Commonly known as:  OSCAL 500/200 D-3 Take 1 tablet by mouth 2 (two) times daily.   Magnesium Oxide 200 MG Tabs Take 1 tablet (200 mg total) by mouth daily.   traMADol 50 MG tablet Commonly known as:  ULTRAM Take 1 tablet (50 mg total) by mouth every 6 (six)  hours as needed (mild pain).      Follow-up Information    Ralene Ok, MD In 3 weeks.   Specialty:  General Surgery Why:  For wound re-check Contact information: [place] [address] [phone]           Signed: Ralene Ok 04/22/2018, 8:47 AM

## 2018-05-01 ENCOUNTER — Emergency Department (HOSPITAL_COMMUNITY)
Admission: EM | Admit: 2018-05-01 | Discharge: 2018-05-01 | Disposition: A | Discharge status: Home / Self Care | Payer: Medicaid Other | Attending: Emergency Medicine | Admitting: Emergency Medicine

## 2018-05-01 ENCOUNTER — Encounter (HOSPITAL_COMMUNITY): Payer: Self-pay | Admitting: Emergency Medicine

## 2018-05-01 DIAGNOSIS — Z79899 Other long term (current) drug therapy: Secondary | ICD-10-CM | POA: Insufficient documentation

## 2018-05-01 DIAGNOSIS — R600 Localized edema: Secondary | ICD-10-CM | POA: Insufficient documentation

## 2018-05-01 DIAGNOSIS — Z85850 Personal history of malignant neoplasm of thyroid: Secondary | ICD-10-CM | POA: Insufficient documentation

## 2018-05-01 DIAGNOSIS — G8918 Other acute postprocedural pain: Secondary | ICD-10-CM | POA: Diagnosis present

## 2018-05-01 DIAGNOSIS — R609 Edema, unspecified: Secondary | ICD-10-CM

## 2018-05-01 LAB — CBC
HCT: 38.9 % (ref 36.0–46.0)
Hemoglobin: 12.4 g/dL (ref 12.0–15.0)
MCH: 25.8 pg — ABNORMAL LOW (ref 26.0–34.0)
MCHC: 31.9 g/dL (ref 30.0–36.0)
MCV: 80.9 fL (ref 80.0–100.0)
PLATELETS: 231 10*3/uL (ref 150–400)
RBC: 4.81 MIL/uL (ref 3.87–5.11)
RDW: 12.5 % (ref 11.5–15.5)
WBC: 15.1 10*3/uL — ABNORMAL HIGH (ref 4.0–10.5)
nRBC: 0 % (ref 0.0–0.2)

## 2018-05-01 LAB — BASIC METABOLIC PANEL
Anion gap: 9 (ref 5–15)
BUN: 10 mg/dL (ref 6–20)
CO2: 27 mmol/L (ref 22–32)
Calcium: 10.1 mg/dL (ref 8.9–10.3)
Chloride: 101 mmol/L (ref 98–111)
Creatinine, Ser: 0.69 mg/dL (ref 0.44–1.00)
GFR calc Af Amer: >60 mL/min (ref >60)
GFR calc non Af Amer: >60 mL/min (ref >60)
Glucose, Bld: 110 mg/dL — ABNORMAL HIGH (ref 70–99)
Potassium: 3.7 mmol/L (ref 3.5–5.1)
Sodium: 137 mmol/L (ref 135–145)

## 2018-05-01 MED ORDER — AMOXICILLIN-POT CLAVULANATE 875-125 MG PO TABS: 1.0000 | ORAL_TABLET | Freq: Two times a day (BID) | ORAL | 0 refills | Status: DC

## 2018-05-01 NOTE — Discharge Instructions (Addendum)
Please return to emergency department if you have increased redness, swelling, or pain Call Dr. Rosendo Gros office Monday for recheck

## 2018-05-01 NOTE — ED Triage Notes (Signed)
Pt reports that she had her thyroid removed 12/17. Reports starting last night has drainage coming from incision site. Denies pain and did not notify MD who did surgery.

## 2018-05-01 NOTE — ED Notes (Signed)
PT DISCHARGED. INSTRUCTIONS GIVEN. AAOX4. PT IN NO APPARENT DISTRESS OR PAIN. THE OPPORTUNITY TO ASK QUESTIONS WAS PROVIDED. 

## 2018-05-01 NOTE — ED Provider Notes (Signed)
[facility] DEPT Provider Note   CSN: [id number] Arrival date & time: 05/01/18  1726     History   Chief Complaint Chief Complaint  Patient presents with  . Post-op Problem    HPI Debra Mills is a 32 y.o. female.  HPI  32 year old female status post thyroidectomy 1217 presents today stating that she had some discharge from the incision and some swelling noted.  She has not had any redness, pain, difficulty speaking or breathing.  She felt that she had a low-grade fever earlier and took some Tylenol.  She is being treated for thyroid cancer.  Surgeon was Dr. Rosendo Gros  Past Medical History:  Diagnosis Date  . Bleeds easily ([place])    when she has a cut.  . Dizziness    on occasion : none today had some dizzines after thyroid surgery this past october   . Dyspnea   . Gestational diabetes   . Goiter   . Headache     Patient Active Problem List   Diagnosis Date Noted  . S/P thyroidectomy 02/10/2018  . Delivery with history of cesarean section 10/12/2016  . Status post repeat low transverse cesarean section 10/12/2016  . Enlarged thyroid gland 09/12/2016  . Gestational diabetes, diet controlled 07/20/2016  . 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 Supervision of high risk pregnancy, antepartum, third trimester 06/26/2016  . Previous cesarean section complicating pregnancy, antepartum condition or complication 77/93/9030  . Insufficient prenatal care 06/26/2016    Past Surgical History:  Procedure Laterality Date  . CESAREAN SECTION N/A 09/28/2014   Procedure: CESAREAN SECTION;  Surgeon: Guss Bunde, MD;  Location: [facility];  Service: Obstetrics;  Laterality: N/A;  . CESAREAN SECTION N/A 10/12/2016   Procedure: REPEAT CESAREAN SECTION;  Surgeon: Guss Bunde, MD;  Location: [facility];  Service: Obstetrics;  Laterality: N/A;  . THYROID LOBECTOMY Right 04/21/2018   Procedure: RIGHT THYROID LOBECTOMY;   Surgeon: Ralene Ok, MD;  Location: [facility];  Service: General;  Laterality: Right;  . THYROIDECTOMY Left 02/10/2018  . THYROIDECTOMY Left 02/10/2018   Procedure: LEFT THYROIDECTOMY;  Surgeon: Ralene Ok, MD;  Location: [facility];  Service: General;  Laterality: Left;     OB History    Gravida  2   Para  1   Term  1   Preterm      AB      Living  2     SAB      TAB      Ectopic      Multiple  0   Live Births  2            Home Medications    Prior to Admission medications   Medication Sig Start Date End Date Taking? Authorizing Provider  acetaminophen (TYLENOL) 500 MG tablet Take 1,000 mg by mouth every 6 (six) hours as needed for moderate pain or headache.     [provider]  calcium-vitamin D (OSCAL 500/200 D-3) 500-200 MG-UNIT tablet Take 1 tablet by mouth 2 (two) times daily. 04/22/18 04/22/19  Ralene Ok, MD  Magnesium Oxide 200 MG TABS Take 1 tablet (200 mg total) by mouth daily. 04/22/18   Ralene Ok, MD  traMADol (ULTRAM) 50 MG tablet Take 1 tablet (50 mg total) by mouth every 6 (six) hours as needed (mild pain). 04/22/18   Ralene Ok, MD    Family History Family History  Problem Relation Age of Onset  . Diabetes Father   .  Kidney disease Father        benign tumor removed 1 kidney  . Cancer Father   . Thyroid disease Father   . Birth defects Brother        heart defect    Social History Social History   Tobacco Use  . Smoking status: Never Smoker  . Smokeless tobacco: Never Used  Substance Use Topics  . Alcohol use: No  . Drug use: No     Allergies   Fish allergy   Review of Systems Review of Systems  All other systems reviewed and are negative.    Physical Exam Updated Vital Signs BP 111/68 (BP Location: Left Arm)   Pulse 79   Temp 98 F (36.7 C) (Oral)   Resp 16   LMP 04/12/2018   SpO2 100%   Physical Exam Vitals signs and nursing note reviewed.  Constitutional:      Appearance:  Normal appearance.  HENT:     Head: Normocephalic.     Right Ear: External ear normal.     Left Ear: External ear normal.     Nose: Nose normal.     Mouth/Throat:     Mouth: Mucous membranes are moist.  Eyes:     Pupils: Pupils are equal, round, and reactive to light.  Neck:     Comments: Well-healing anterior incision with mild swelling and fluctuance noted on exam No erythema, warmth, or tenderness Cardiovascular:     Rate and Rhythm: Normal rate and regular rhythm.  Pulmonary:     Effort: Pulmonary effort is normal.  Abdominal:     General: Abdomen is flat.  Musculoskeletal: Normal range of motion.  Skin:    General: Skin is warm and dry.     Capillary Refill: Capillary refill takes less than 2 seconds.  Neurological:     General: No focal deficit present.     Mental Status: She is alert.  Psychiatric:        Mood and Affect: Mood normal.      ED Treatments / Results  Labs (all labs ordered are listed, but only abnormal results are displayed) Labs Reviewed  CBC  BASIC METABOLIC PANEL    EKG None  Radiology No results found.  Procedures Procedures (including critical care time)  Medications Ordered in ED Medications - No data to display   Initial Impression / Assessment and Plan / ED Course  I have reviewed the triage vital signs and the nursing notes.  Pertinent labs & imaging results that were available during my care of the patient were reviewed by me and considered in my medical decision making (see chart for details).    Discussed with Dr. Marlou Starks, on-call for Dr. Rosendo Gros.  Plan Augmentin and patient will recheck with Dr. Winfred Burn early next week.  We discussed return precautions such as increased swelling, difficulty breathing or swallowing, or return of fever. Call Dr. Letitia Caul office on Monday morning for recheck Final Clinical Impressions(s) / ED Diagnoses   Final diagnoses:  Swelling  Post-operative pain    ED Discharge Orders    None        Pattricia Boss, MD 05/01/18 2221

## 2019-07-22 ENCOUNTER — Ambulatory Visit: Payer: Medicaid Other

## 2019-07-26 ENCOUNTER — Ambulatory Visit: Payer: Medicaid Other | Attending: Internal Medicine

## 2019-07-26 DIAGNOSIS — Z23 Encounter for immunization: Secondary | ICD-10-CM

## 2019-07-26 NOTE — Progress Notes (Signed)
   Covid-19 Vaccination Clinic  Name:  Debra Mills    MRN: [id number] DOB: [birth year]  07/26/2019  [name] was observed post Covid-19 immunization for 15 minutes without incident. She was provided with Vaccine Information Sheet and instruction to access the V-Safe system.   [name] was instructed to call 911 with any severe reactions post vaccine: Marland Kitchen Difficulty breathing  . Swelling of face and throat  . A fast heartbeat  . A bad rash all over body  . Dizziness and weakness   Immunizations Administered    Name Date Dose VIS Date Route   Pfizer COVID-19 Vaccine 07/26/2019 10:13 AM 0.3 mL 04/16/2019 Intramuscular   Manufacturer: [place]   Lot: [id number]   [place]: [id number]

## 2019-08-18 ENCOUNTER — Ambulatory Visit: Payer: Medicaid Other | Attending: Internal Medicine

## 2019-08-18 DIAGNOSIS — Z23 Encounter for immunization: Secondary | ICD-10-CM

## 2019-08-18 NOTE — Progress Notes (Signed)
   Covid-19 Vaccination Clinic  Name:  Debra Mills    MRN: [id number] DOB: [birth year]  08/18/2019  [name] was observed post Covid-19 immunization for 15 minutes without incident. She was provided with Vaccine Information Sheet and instruction to access the V-Safe system.   [name] was instructed to call 911 with any severe reactions post vaccine: Marland Kitchen Difficulty breathing  . Swelling of face and throat  . A fast heartbeat  . A bad rash all over body  . Dizziness and weakness   Immunizations Administered    Name Date Dose VIS Date Route   Pfizer COVID-19 Vaccine 08/18/2019 10:55 AM 0.3 mL 04/16/2019 Intramuscular   Manufacturer: [place]   Lot: [id number]   [place]: [id number]

## 2019-09-21 IMAGING — CR DG CHEST 2V
2 series · 2 of 2 positions shown · non-contrast
Comparison: None.

CLINICAL DATA: Preop, scheduled thyroidectomy headaches and history
of diabetes

EXAM:
CHEST - 2 VIEW

[w chest pa]
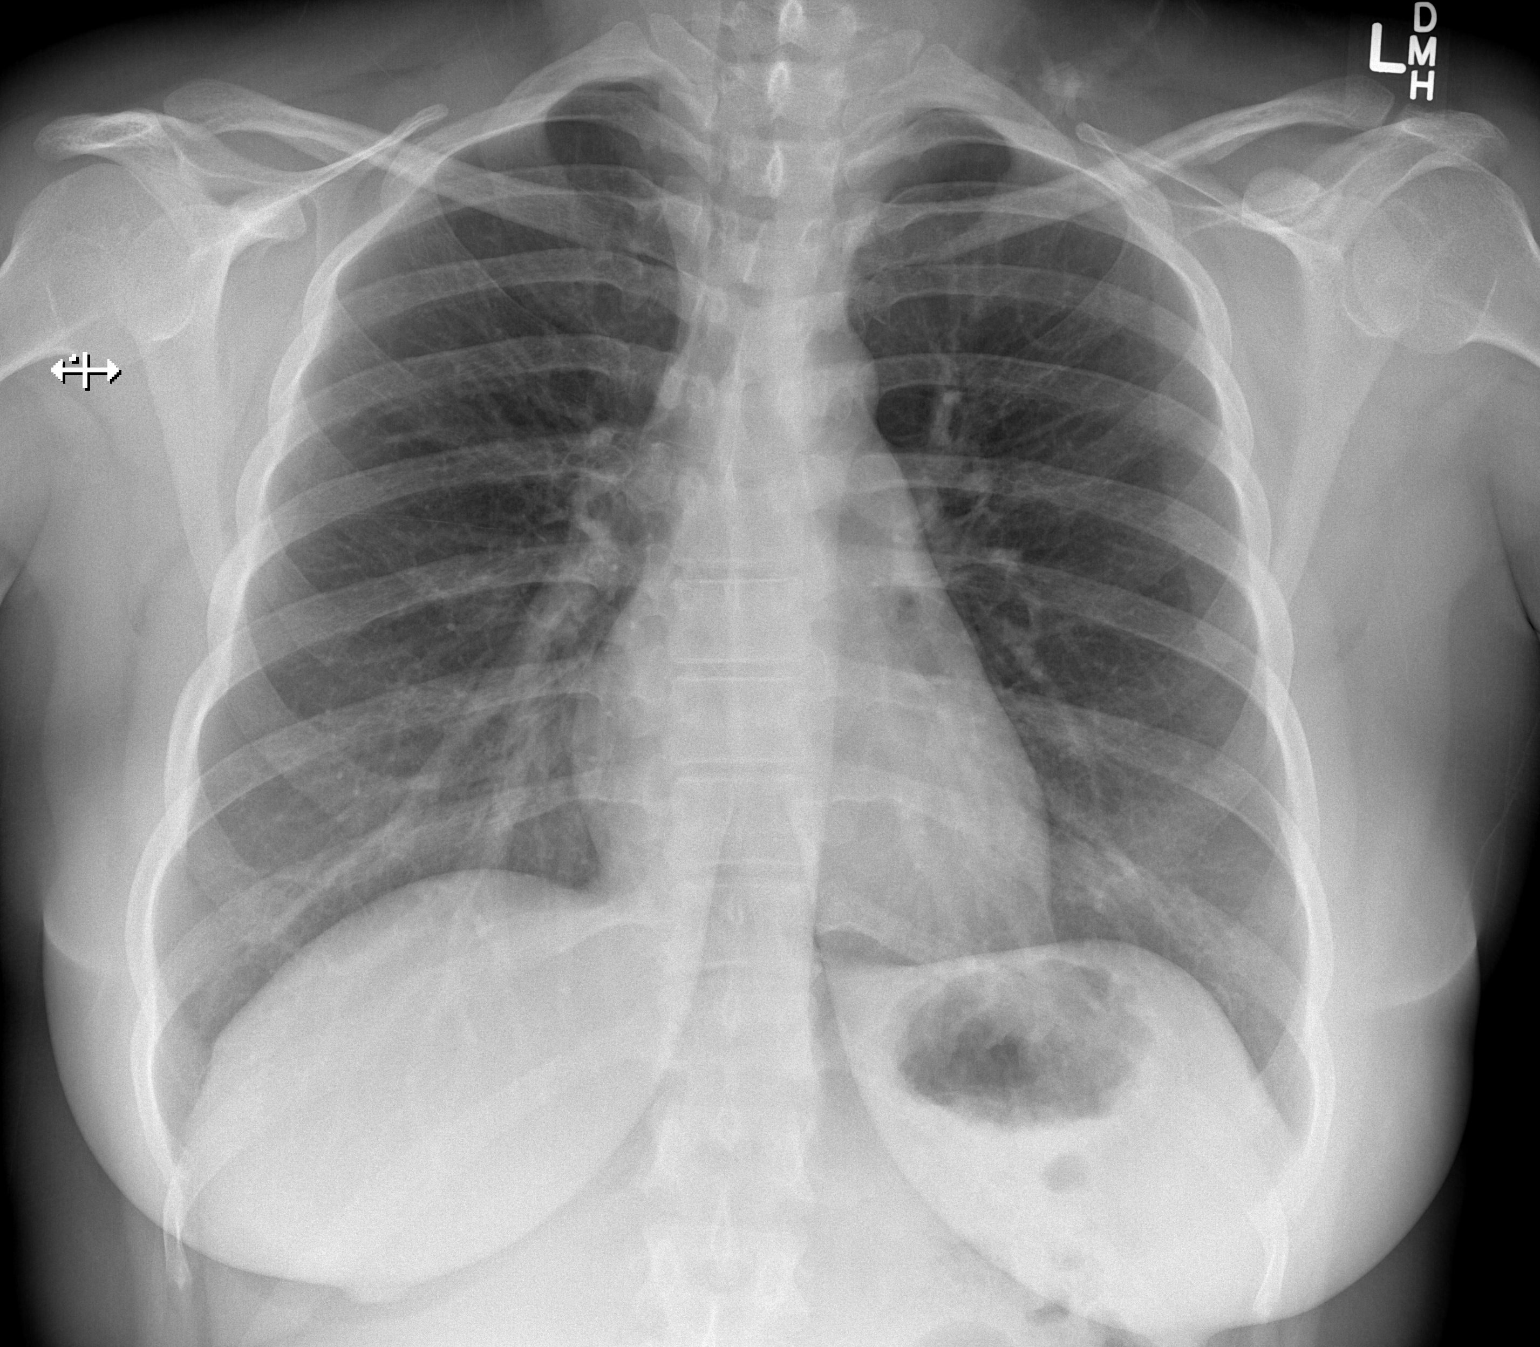

[w chest lat]
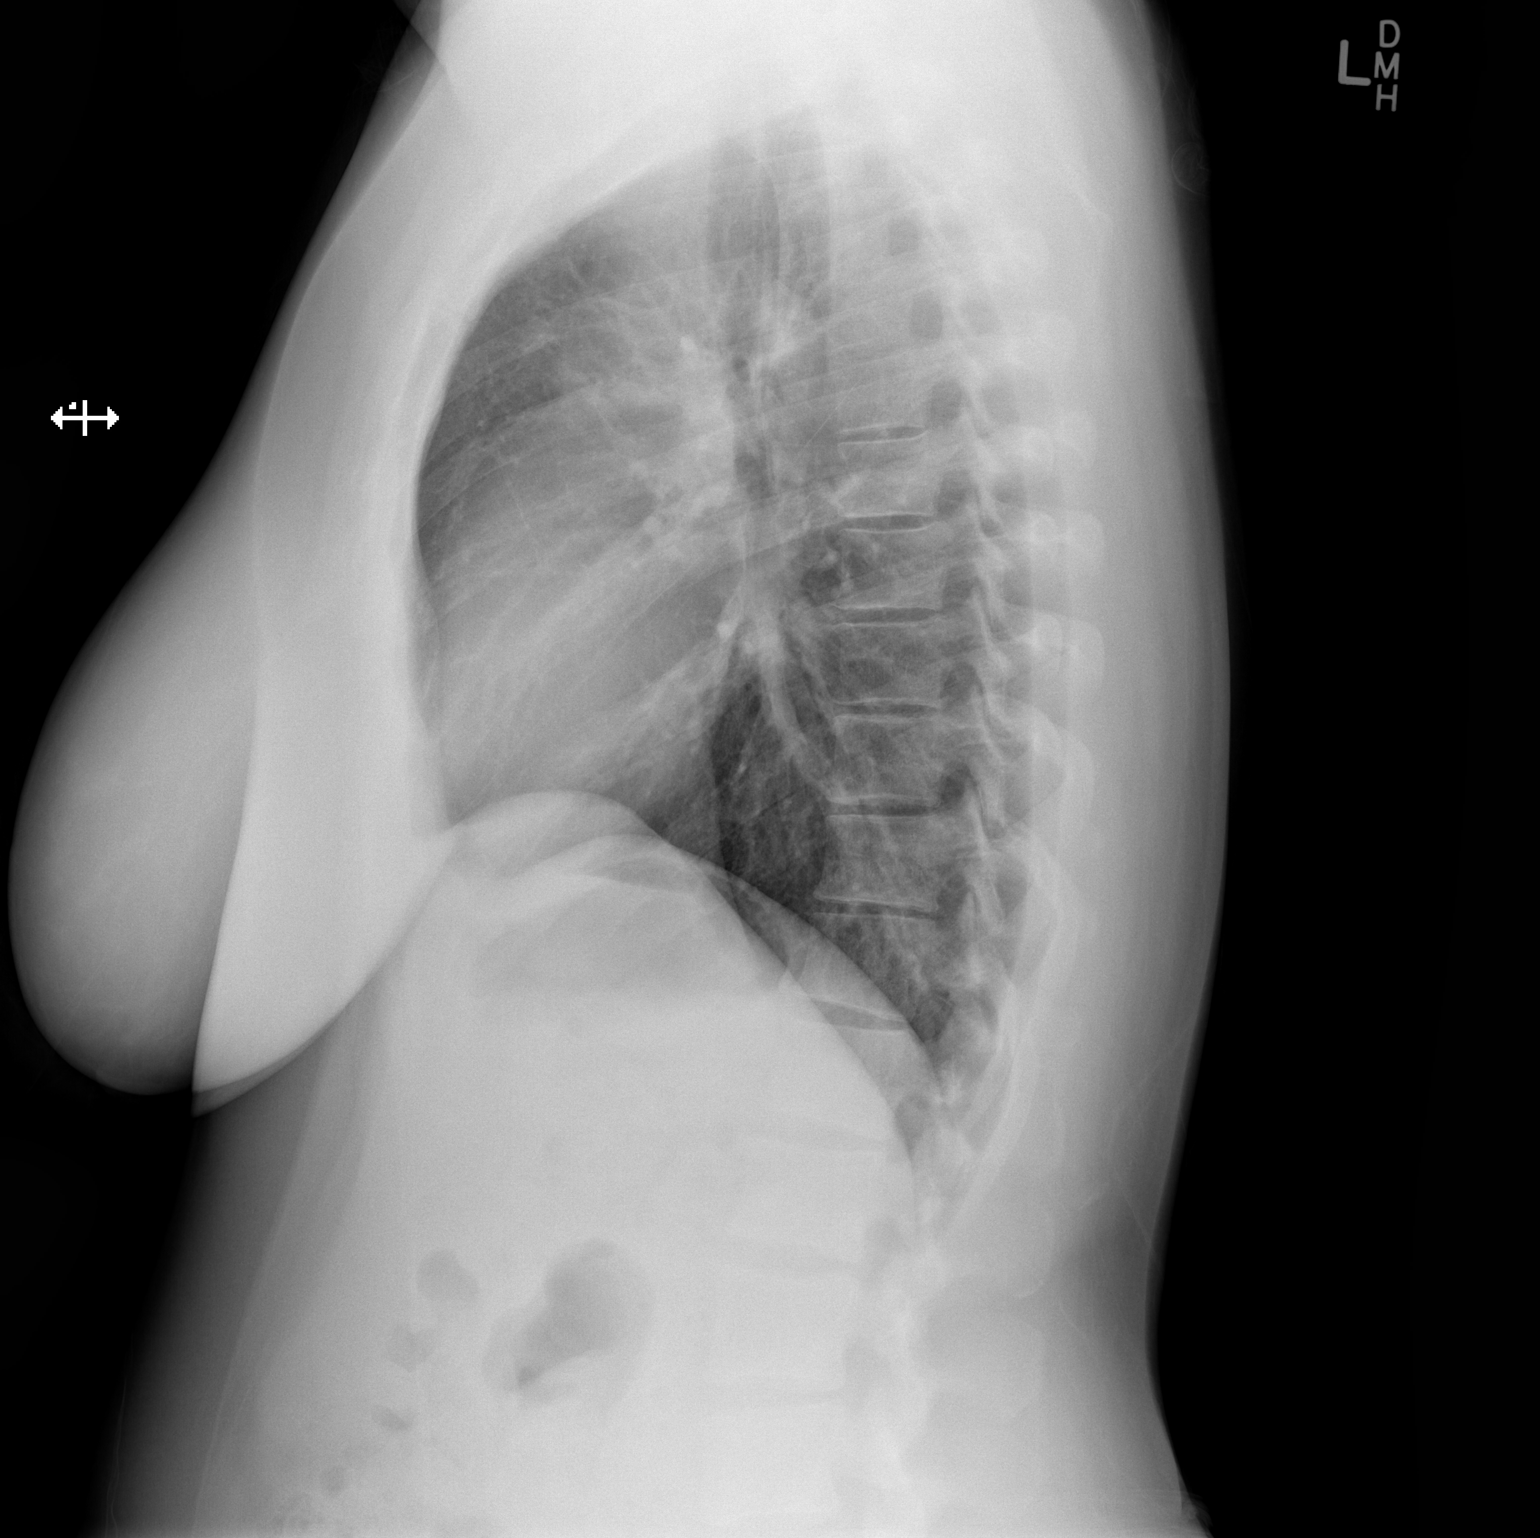

[2 of 2 positions shown; findings below may reference images not displayed]

FINDINGS: The heart size and mediastinal contours are within normal limits.
Both lungs are clear. The visualized skeletal structures are
unremarkable.
IMPRESSION: No active cardiopulmonary disease.

## 2020-05-31 ENCOUNTER — Ambulatory Visit (INDEPENDENT_AMBULATORY_CARE_PROVIDER_SITE_OTHER): Payer: Self-pay | Admitting: "Endocrinology

## 2020-07-19 NOTE — Progress Notes (Signed)
Subjective:  Patient Name: Debra Mills Date of Birth: 06-13-1985  MRN: [id number]  Debra Mills  presents to the office today, in referral from Debra Debra Spencer, PA, for endocrine consultation for the chief complaint of her s/p thyroid cancer and post-surgical hypothyroidism.   HISTORY OF PRESENT ILLNESS:   Debra Mills is a 35 y.o. [place] woman.   Debra Mills was accompanied by her husband, daughter, and the Arabic interpreter Architect.   1. Debra Mills had her initial endocrine consultation with me on 07/20/20:  A. In 2019 she had a large goiter. She saw Dr. Meyer Russel who arranged for her to have fine needle aspirate (FNA)The FNA was suspicious for cancer. She had a left hemi-thyroidectomy on 10/082019 at [facility]. Her surgeon was Dr. Ralene Ok. She had a tumor that was 7.5 cm in largest dimension, but was limited to the thyroid. 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 The tumor was classified as pT3a,pNO. She had her right lobe resected on 04/22/18.  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 Because she was felt to be at low risk for cancer recurrence, she did not have any I-131 treatment thereafter. She has been treated with thyroid hormone, but the levels have varied. Her post-operative thyroglobulin level in January 2020 was 0.7. She saw Dr. Hartford Poli for her last post-operative visit on 11/25/18. In-office [place] did not show any residual tissue. Dr. Hartford Poli ordered follow up lab tests, but Debra. Mills chose not to follow up with him.   B. Pertinent past medical history:   1). Medical problems: Obesity; Hair is coming out. She has had ear infections. She has been tremulous, tachycardic, and short of breath with activity.    2). Surgeries: C-section, thyroidectomy    3). Allergies: No  known medication allergies   4). GYN: LMP 07/01/20. Menses occur regularly. She has two children.    5). Medications: Ciprofloxacin-dexamethasone ear drops daily; levothyroxine, 112 mcg/day  C. Pertinent family history   1). Obesity: None   2). DM: Father takes pills.   3). Thyroid disease: Father had thyroid surgery, but not cancer.    4). ASCVD: None   5). Cancers:  Brother has lymphoma of his neck.    6). Others: None  2. Pertinent Review of Systems:  Constitutional: The patient feels good, but tired.   Eyes: Vision is good. There are no significant eye complaints. Neck: The patient has complaints of anterior neck pains that come and go. She does not have any swelling, pressure, or difficulty swallowing.  Heart: Heart rate increases with exercise or other physical activity. The patient has no complaints of palpitations, irregular heat beats, chest pain, or chest pressure. Gastrointestinal: Bowel movents seem normal. The patient has nausea occasionally,. She also has some comlaints of excessive belly hunger, but no acid reflux, stomach aches or pains, diarrhea, or constipation. Hands : She sometimes has pains in the dorsa of her hands when she cuts something with a knife.  Legs: Muscle mass and strength seem normal. There are no complaints of numbness, tingling, burning, or pain. No edema is noted. Feet: There are no obvious foot problems. There are no complaints of numbness, tingling, burning, or pain. No edema is noted. GYN: LMP 07/01/20. Normal monthly cycles.   PAST MEDICAL, FAMILY, AND SOCIAL HISTORY:  Past Medical History:  Diagnosis Date  .  Bleeds easily ([place])    when she has a cut.  . Dizziness    on occasion : none today had some dizzines after thyroid surgery this past october   . Dyspnea   . Gestational diabetes   . Goiter   . Headache     Family History  Problem Relation Age of Onset  . Diabetes Father   . Kidney disease Father        benign tumor removed 1 kidney   . Cancer Father   . Thyroid disease Father   . Birth defects Brother        heart defect     Current Outpatient Medications:  .  ciprofloxacin-dexamethasone (CIPRODEX) OTIC suspension, Place four drops into both ears 2 (two) times daily., Disp: , Rfl:  .  levothyroxine (SYNTHROID) 112 MCG tablet, Take by mouth., Disp: , Rfl:  .  acetaminophen (TYLENOL) 500 MG tablet, Take 1,000 mg by mouth every 6 (six) hours as needed for moderate pain or headache.  (Patient not taking: Reported on 07/20/2020), Disp: , Rfl:  .  amoxicillin-clavulanate (AUGMENTIN) 875-125 MG tablet, Take 1 tablet by mouth every 12 (twelve) hours. (Patient not taking: Reported on 07/20/2020), Disp: 14 tablet, Rfl: 0 .  calcium-vitamin D (OSCAL 500/200 D-3) 500-200 MG-UNIT tablet, Take 1 tablet by mouth 2 (two) times daily., Disp: 180 tablet, Rfl: 3 .  Magnesium Oxide 200 MG TABS, Take 1 tablet (200 mg total) by mouth daily. (Patient not taking: Reported on 07/20/2020), Disp: 30 tablet, Rfl: 0 .  traMADol (ULTRAM) 50 MG tablet, Take 1 tablet (50 mg total) by mouth every 6 (six) hours as needed (mild pain). (Patient not taking: Reported on 07/20/2020), Disp: 20 tablet, Rfl: 0  Allergies as of 07/20/2020 - Review Complete 07/20/2020  Allergen Reaction Noted  . Fish allergy Hives 07/06/2014    1. 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  2. Activities: Sedentary 3. Smoking, alcohol, or drugs: None 4. Primary Care Provider: Mindi Curling, PA-C  REVIEW OF SYSTEMS: There are no other significant problems involving Debra Mills's other body systems.   Objective:  Vital Signs:  BP 118/78   Pulse 68   Wt 175 lb (79.4 kg)   BMI 33.07 kg/m    Ht Readings from Last 3 Encounters:  05/01/18 5\' 1"  (1.549 m)  04/21/18 5\' 1"  (1.549 m)  02/10/18 5\' 1"  (1.549 m)   Wt Readings from Last 3 Encounters:  07/20/20 175 lb (79.4 kg)  05/01/18 174 lb 2.6 oz (79 kg)  04/21/18 175 lb (79.4 kg)   HC  Readings from Last 3 Encounters:  No data found for [facility]   Body surface area is 1.85 meters squared.  PHYSICAL EXAM:  Constitutional: The patient appears healthy, but moderately obese.   Face: The face appears normal.  Eyes: There is no obvious arcus or proptosis. Moisture appears normal. Mouth: The oropharynx and tongue appear normal. Oral moisture is normal. Neck: The neck appears to be visibly normal. No carotid bruits are noted. The thyroid gland is absent. She has palpable induration of her strap muscles bilaterally. She has tenderness to palpation of the right mid-strap muscle. I do not palpate any abnormal nodes or subclavian masses. Lungs: The lungs are clear to auscultation. Air I movement is good. Heart: Heart rate and rhythm are regular. Heart sounds S1 and S2 are normal. I did not appreciate any pathologic cardiac murmurs. Abdomen: The abdomen is obese.  Bowel sounds are normal. There is no obvious hepatomegaly, splenomegaly, or other mass effect.  Arms: Muscle size and bulk are normal for age. Hands: There is no obvious tremor. Phalangeal and metacarpophalangeal joints are normal. Palmar muscles are normal. Palmar skin is normal. Palmar moisture is also normal. She has slight pallor of her nail beds.  Legs: Muscles appear normal for age. No edema is present. Neurologic: Strength is normal for age in both the upper and lower extremities. Muscle tone is normal. Sensation to touch is normal in both legs.   LAB DATA:  No results found for this or any previous visit (from the past 504 hour(s)).   Assessment and Plan:   ASSESSMENT:  1. Papillary thyroid cancer, follicular variant:  A. Because there was no recognized extrathyroidal extension, vascular invasion, or nodal involvement, she was considered to be at low risk of cancer recurrence.   B. Today I feel more induration of the strap muscles than usual 15 months postoperatively. The tight strap muscle is painful, but the right  lobe did not have cancer, so the pain probably represents scar tissue.  C. It is reasonable to obtain a thyroglobulin, thyroglobulin antibody, and thyroid US now.   2. Pain in the right strap muscle: As above 3. Obesity: She is moderately obese. We will discuss this issue further at future clinic visits.  4-5. Dyspepsia/nausea: She appears to have excess gastric acid production. She is a good candidate for omeprazole therapy.  6. Pallor of nail beds: She may be anemic, presumably due to ongoing menses.  PLAN:  1. Diagnostic: HbA1c, TFTs, thyroglobulin, thyroglobulin antibody, CMP, CBC, iron 2. Therapeutic: Start omeprazole, 20 mg twice daily.  3. Patient education: We discussed all of the above at great length with the assistance of the interpreter. Debra. Lai and her husband appreciated all the time I took with them to review her record and explain everything to them.  4. Follow-up: 3 months  Level of Service: This visit lasted in excess of 40 minutes. More than 50% of the visit was devoted to counseling.  Tillman Sers, MD, CDE Adult and Pediatric Endocrinology

## 2020-07-20 ENCOUNTER — Ambulatory Visit (INDEPENDENT_AMBULATORY_CARE_PROVIDER_SITE_OTHER): Payer: Medicaid Other | Admitting: "Endocrinology

## 2020-07-20 ENCOUNTER — Encounter (INDEPENDENT_AMBULATORY_CARE_PROVIDER_SITE_OTHER): Payer: Self-pay | Admitting: "Endocrinology

## 2020-07-20 ENCOUNTER — Other Ambulatory Visit: Payer: Self-pay

## 2020-07-20 VITALS — BP 118/78 | HR 68 | Wt 175.0 lb

## 2020-07-20 DIAGNOSIS — E669 Obesity, unspecified: Secondary | ICD-10-CM | POA: Insufficient documentation

## 2020-07-20 DIAGNOSIS — R1013 Epigastric pain: Secondary | ICD-10-CM

## 2020-07-20 DIAGNOSIS — Z6833 Body mass index (BMI) 33.0-33.9, adult: Secondary | ICD-10-CM

## 2020-07-20 DIAGNOSIS — E66811 Other obesity due to excess calories: Secondary | ICD-10-CM

## 2020-07-20 DIAGNOSIS — C73 Malignant neoplasm of thyroid gland: Secondary | ICD-10-CM | POA: Diagnosis not present

## 2020-07-20 DIAGNOSIS — E6609 Other obesity due to excess calories: Secondary | ICD-10-CM

## 2020-07-20 DIAGNOSIS — E890 Postprocedural hypothyroidism: Secondary | ICD-10-CM | POA: Diagnosis not present

## 2020-07-20 DIAGNOSIS — M542 Cervicalgia: Secondary | ICD-10-CM | POA: Diagnosis not present

## 2020-07-20 DIAGNOSIS — E661 Drug-induced obesity: Secondary | ICD-10-CM

## 2020-07-20 DIAGNOSIS — R110 Nausea: Secondary | ICD-10-CM

## 2020-07-20 DIAGNOSIS — I10 Essential (primary) hypertension: Secondary | ICD-10-CM

## 2020-07-20 DIAGNOSIS — R231 Pallor: Secondary | ICD-10-CM

## 2020-07-20 MED ORDER — OMEPRAZOLE 20 MG PO CPDR: DELAYED_RELEASE_CAPSULE | ORAL | 5 refills | Status: AC

## 2020-07-20 NOTE — Patient Instructions (Signed)
Follow up visit in 3 months. 

## 2020-07-21 ENCOUNTER — Ambulatory Visit
Admission: RE | Admit: 2020-07-21 | Discharge: 2020-07-21 | Disposition: A | Discharge status: Home / Self Care | Payer: Medicaid Other | Source: Ambulatory Visit | Attending: "Endocrinology | Admitting: "Endocrinology

## 2020-07-21 DIAGNOSIS — C73 Malignant neoplasm of thyroid gland: Secondary | ICD-10-CM

## 2020-07-22 LAB — T3, FREE: T3, Free: 3.3 pg/mL (ref 2.3–4.2)

## 2020-07-22 LAB — CBC WITH DIFFERENTIAL/PLATELET: Platelets: 229 10*3/uL (ref 140–400)

## 2020-07-24 LAB — COMPREHENSIVE METABOLIC PANEL
AG Ratio: 1.5 (calc) (ref 1.0–2.5)
ALT: 13 U/L (ref 6–29)
AST: 13 U/L (ref 10–30)
Albumin: 4.4 g/dL (ref 3.6–5.1)
Alkaline phosphatase (APISO): 70 U/L (ref 31–125)
BUN: 14 mg/dL (ref 7–25)
CO2: 26 mmol/L (ref 20–32)
Calcium: 10 mg/dL (ref 8.6–10.2)
Chloride: 103 mmol/L (ref 98–110)
Creat: 0.61 mg/dL (ref 0.50–1.10)
Globulin: 3 g/dL (calc) (ref 1.9–3.7)
Glucose, Bld: 81 mg/dL (ref 65–139)
Potassium: 4.1 mmol/L (ref 3.5–5.3)
Sodium: 137 mmol/L (ref 135–146)
Total Bilirubin: 0.5 mg/dL (ref 0.2–1.2)
Total Protein: 7.4 g/dL (ref 6.1–8.1)

## 2020-07-24 LAB — T4, FREE: Free T4: 1.4 ng/dL (ref 0.8–1.8)

## 2020-07-24 LAB — CBC WITH DIFFERENTIAL/PLATELET
Absolute Monocytes: 802 cells/uL (ref 200–950)
Basophils Absolute: 45 cells/uL (ref 0–200)
Basophils Relative: 0.4 %
Eosinophils Absolute: 305 cells/uL (ref 15–500)
Eosinophils Relative: 2.7 %
HCT: 38.9 % (ref 35.0–45.0)
Hemoglobin: 12.7 g/dL (ref 11.7–15.5)
Lymphs Abs: 3198 cells/uL (ref 850–3900)
MCH: 25.9 pg — ABNORMAL LOW (ref 27.0–33.0)
MCHC: 32.6 g/dL (ref 32.0–36.0)
MCV: 79.2 fL — ABNORMAL LOW (ref 80.0–100.0)
MPV: 12 fL (ref 7.5–12.5)
Monocytes Relative: 7.1 %
Neutro Abs: 6950 cells/uL (ref 1500–7800)
Neutrophils Relative %: 61.5 %
RBC: 4.91 10*6/uL (ref 3.80–5.10)
RDW: 13.2 % (ref 11.0–15.0)
Total Lymphocyte: 28.3 %
WBC: 11.3 10*3/uL — ABNORMAL HIGH (ref 3.8–10.8)

## 2020-07-24 LAB — THYROGLOBULIN ANTIBODY: Thyroglobulin Ab: <1 IU/mL (ref <=1)

## 2020-07-24 LAB — THYROGLOBULIN LEVEL: Thyroglobulin: 0.1 ng/mL — ABNORMAL LOW

## 2020-07-24 LAB — TSH: TSH: 0.41 mIU/L

## 2020-07-24 LAB — HEMOGLOBIN A1C
Hgb A1c MFr Bld: 5.6 % of total Hgb (ref <5.7)
Mean Plasma Glucose: 114 mg/dL
eAG (mmol/L): 6.3 mmol/L

## 2020-07-24 LAB — IRON: Iron: 92 ug/dL (ref 40–190)

## 2020-08-07 ENCOUNTER — Encounter (INDEPENDENT_AMBULATORY_CARE_PROVIDER_SITE_OTHER): Payer: Self-pay | Admitting: *Deleted

## 2020-10-18 ENCOUNTER — Ambulatory Visit (INDEPENDENT_AMBULATORY_CARE_PROVIDER_SITE_OTHER): Payer: Medicaid Other | Admitting: "Endocrinology

## 2020-10-18 ENCOUNTER — Encounter (INDEPENDENT_AMBULATORY_CARE_PROVIDER_SITE_OTHER): Payer: Self-pay | Admitting: "Endocrinology

## 2020-10-18 ENCOUNTER — Other Ambulatory Visit: Payer: Self-pay

## 2020-10-18 VITALS — BP 108/64 | HR 84 | Temp 97.6°F | Wt 173.4 lb

## 2020-10-18 DIAGNOSIS — C73 Malignant neoplasm of thyroid gland: Secondary | ICD-10-CM | POA: Diagnosis not present

## 2020-10-18 DIAGNOSIS — E890 Postprocedural hypothyroidism: Secondary | ICD-10-CM

## 2020-10-18 DIAGNOSIS — K0889 Other specified disorders of teeth and supporting structures: Secondary | ICD-10-CM | POA: Insufficient documentation

## 2020-10-18 DIAGNOSIS — R231 Pallor: Secondary | ICD-10-CM

## 2020-10-18 DIAGNOSIS — E6609 Other obesity due to excess calories: Secondary | ICD-10-CM | POA: Diagnosis not present

## 2020-10-18 DIAGNOSIS — R1013 Epigastric pain: Secondary | ICD-10-CM

## 2020-10-18 NOTE — Patient Instructions (Signed)
Follow up visit in 3 months. 

## 2020-10-18 NOTE — Progress Notes (Signed)
Subjective:  Patient Name: Debra Mills Date of Birth: October 11, 1985  MRN: [id number]  Debra Mills  presents to the office today for follow up evaluation and treatment of her thyroid cancer, post-surgical hypothyroidism., obesity, dyspepsia/epigastric pains, and pallor of her finger nail beds.  HISTORY OF PRESENT ILLNESS:   Debra Mills is a 35 y.o. [place] woman.   Debra Mills was accompanied by her husband, 21 y.o. son, 4 y.o.daughter.   1. Debra Mills had her initial endocrine consultation with me on 07/20/20:  A. In 2019 she had a large goiter. She saw Dr. Meyer Russel who arranged for her to have fine needle aspirate (FNA)The FNA was suspicious for cancer. She had a left hemi-thyroidectomy on 10/082019 at [facility]. Her surgeon was Dr. Ralene Ok. She had a tumor that was 7.5 cm in largest dimension, but was limited to the thyroid. 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 The tumor was classified as pT3a,pNO. She had her right lobe resected on 04/22/18.  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 Because she was felt to be at low risk for cancer recurrence, she did not have any I-131 treatment thereafter. She has been treated with thyroid hormone, but the levels have varied. Her post-operative thyroglobulin level in January 2020 was 0.7. She saw Dr. Hartford Poli for her last post-operative visit on 11/25/18. In-office [place] did not show any residual tissue. Dr. Hartford Poli ordered follow up lab tests, but Debra. Mills chose not to follow up with him.   B. Pertinent past medical history:   1). Medical problems: Obesity; Hair is coming out. She has had ear infections. She has been tremulous, tachycardic, and short of breath with activity.    2). Surgeries: C-section, thyroidectomy    3). Allergies: No  known medication allergies   4). GYN: LMP 07/01/20. Menses occur regularly. She has two children.    5). Medications: Ciprofloxacin-dexamethasone ear drops daily; levothyroxine, 112 mcg/day  C. Pertinent family history   1). Obesity: None   2). DM: Father takes pills.   3). Thyroid disease: Father had thyroid surgery, but not cancer.    4). ASCVD: None   5). Cancers:  Brother has lymphoma of his neck.    6). Others: None  2. Debra Mills' last endocrine clinic visit occurred on 07/20/20. I started her on omeprazole, 20 mg, twice daily.  A. In the interim she has been healthy.  B. Her excess belly hunger and epigastric pains have resolved.   C. She continues to take 112 mcg/day of levothyroxine.   D. She suffered a broken right mandibular tooth about a week ago. She has pain in her right mandible that radiates down her neck to the area of her residual thyroid tissue.   3. Pertinent Review of Systems:  Constitutional: The patient feels good, but tired. She does not have any trouble falling asleep or staying asleep. She often feels too hot.  Eyes: Vision is good. There are no significant eye complaints. Neck: The patient has complaints of right anterior neck pains that come and go. She does not have any swelling, pressure, or difficulty swallowing.  Heart: Heart rate increases with exercise or other physical activity. The patient has no complaints of palpitations, irregular heat beats, chest pain, or chest pressure. Gastrointestinal: As above. Bowel movents seem normal. The patient no longer has occasional  nausea,. She no longer has excessive belly hunger and epigastric pains.  Hands: She does not have any tremor.  She still sometimes has pains in the dorsa of her hands when she uses a knife to cut something.  Legs: Muscle mass and strength seem normal. There are no complaints of numbness, tingling, burning, or pain. No edema is noted. Feet: There are no obvious foot problems. There are no complaints  of numbness, tingling, burning, or pain. No edema is noted. GYN: LMP 09/28/20. Normal monthly cycles.   PAST MEDICAL, FAMILY, AND SOCIAL HISTORY:  Past Medical History:  Diagnosis Date   Bleeds easily ([place])    when she has a cut.   Dizziness    on occasion : none today had some dizzines after thyroid surgery this past october    Dyspnea    Gestational diabetes    Goiter    Headache     Family History  Problem Relation Age of Onset   Diabetes Father    Kidney disease Father        benign tumor removed 1 kidney   Cancer Father    Thyroid disease Father    Birth defects Brother        heart defect     Current Outpatient Medications:    levothyroxine (SYNTHROID) 112 MCG tablet, Take by mouth., Disp: , Rfl:    omeprazole (PRILOSEC) 20 MG capsule, Take one capsule twice daily., Disp: 60 capsule, Rfl: 5   acetaminophen (TYLENOL) 500 MG tablet, Take 1,000 mg by mouth every 6 (six) hours as needed for moderate pain or headache.  (Patient not taking: No sig reported), Disp: , Rfl:    amoxicillin-clavulanate (AUGMENTIN) 875-125 MG tablet, Take 1 tablet by mouth every 12 (twelve) hours. (Patient not taking: No sig reported), Disp: 14 tablet, Rfl: 0   calcium-vitamin D (OSCAL 500/200 D-3) 500-200 MG-UNIT tablet, Take 1 tablet by mouth 2 (two) times daily., Disp: 180 tablet, Rfl: 3   ciprofloxacin-dexamethasone (CIPRODEX) OTIC suspension, Place four drops into both ears 2 (two) times daily. (Patient not taking: Reported on 10/18/2020), Disp: , Rfl:    Magnesium Oxide 200 MG TABS, Take 1 tablet (200 mg total) by mouth daily. (Patient not taking: No sig reported), Disp: 30 tablet, Rfl: 0   traMADol (ULTRAM) 50 MG tablet, Take 1 tablet (50 mg total) by mouth every 6 (six) hours as needed (mild pain). (Patient not taking: No sig reported), Disp: 20 tablet, Rfl: 0  Allergies as of 10/18/2020 - Review Complete 10/18/2020  Allergen Reaction Noted   Fish allergy Hives 07/06/2014    1. 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  2. Activities: Sedentary 3. Smoking, alcohol, or drugs: None 4. Primary Care Provider: Mindi Curling, PA-C  REVIEW OF SYSTEMS: There are no other significant problems involving Clodagh's other body systems.   Objective:  Vital Signs:  BP 108/64 (BP Location: Right Arm, Patient Position: Sitting, Cuff Size: Normal)   Pulse (!) 8   Wt 173 lb 6.4 oz (78.7 kg)   BMI 32.76 kg/m  HR was 84.Temperature was 97.6 degrees.    Ht Readings from Last 3 Encounters:  05/01/18 5\' 1"  (1.549 m)  04/21/18 5\' 1"  (1.549 m)  02/10/18 5\' 1"  (1.549 m)   Wt Readings from Last 3 Encounters:  10/18/20 173 lb 6.4 oz (78.7 kg)  07/20/20 175 lb (79.4 kg)  05/01/18 174 lb 2.6 oz (79 kg)  HC Readings from Last 3 Encounters:  No data found for [facility]   Body surface area is 1.84 meters squared.  PHYSICAL EXAM:  Constitutional: The patient appears healthy, but moderately obese. She has lost two pounds Face: The face appears normal, except for her right mandible being somewhat swollen and tender to percussion, but not erythematous.   Eyes: There is no obvious arcus or proptosis. Moisture appears normal. Mouth: The oropharynx and tongue appear normal. Oral moisture is normal. There is no tongue tremor.  Neck: The neck appears to be visibly normal. No carotid bruits are noted. The thyroid gland is absent. She has palpable induration of her strap muscles bilaterally. She also has tenderness radiating downward from the right mandible to the thyroid bed. I do not palpate any abnormal nodes or subclavian masses. Lungs: The lungs are clear to auscultation. Air movement is good. Heart: Heart rate and rhythm are regular. Heart sounds S1 and S2 are normal. I did not appreciate any pathologic cardiac murmurs. Abdomen: The abdomen is obese. Bowel sounds are normal. There is no obvious hepatomegaly, splenomegaly, or other mass effect. There is no tenderness  to palpation. Arms: Muscle size and bulk are normal for age. Hands: There is no obvious tremor. Phalangeal and metacarpophalangeal joints are normal. Palmar muscles are normal. Palmar skin is normal. Palmar moisture is also normal. She has no pallor of her nail beds.  Legs: Muscles appear normal for age. No edema is present. Neurologic: Strength is normal for age in both the upper and lower extremities. Muscle tone is normal. Sensation to touch is normal in both legs.   LAB DATA:  No results found for this or any previous visit (from the past 504 hour(s)).  LABS 07/21/20: HbA1c 5.6%; TSH 0.41, free T4 1.4, free T3 3.3, thyroglobulin 0.1, thyroglobulin antibody <1; CMP normal; CBC norm al, except WBC 11.3 (ref 3.8-10.8), MCV 79.2 (ref 80-100), MCH 25.9 (ref 27-33); iron 92  IMAGING:  US thyroid 07/21/20: The left lobe and isthmus are surgically absent. There is no residual thyroid tissue in the isthmus resection bed or the left lobectomy resection bed. In the right lobectomy resection bed there is a 9 x 6 x 5 mm isoechoic nodular soft tissue area, potentially representative of residual thyroid parenchyma.    Assessment and Plan:   ASSESSMENT:  1. Papillary thyroid cancer, follicular variant:  A. Debra Chisum had the appropriate surgery for her thyroid cancer. Because there was no recognized extrathyroidal extension, vascular invasion, or nodal involvement, she was considered to be at low risk of cancer recurrence. Therefore she was not given any I-131 irradiation.   B. At her initial visit with me in March 2022 I felt more induration of the strap muscles than usual 15 months postoperatively. The right strap muscle was painful. Since that lobe had not been known to have cancer, I felt that the area of right neck tenderness might represent some inflamed scar tissue. I ordered an [place] to better investigate the issue.   C. When I saw the results of her [place], I then knew that there was a small area of residual  thyroid tissue in the area that had felt tender in March. From her path report I also knew that the right lobe tissue showed evidence of lymphocytic (Hashimoto's) thyroiditis. This form of thyroiditis is often painful.   D. Her thyroglobulin (Tg) level in March 2022 was very low, c/w her having very minimal thyroid tissue. This Tg level could be due to  residual thyroid tissue alone, or to a combination of residual normal thyroid tissue and some cancer. However, because the remainder of her right lobe was benign, and there was no recognized spread of cancer outside the left lobe, the risk of cancer in the residual right lobe tissue or left thyroid bed is exceedingly low. Her thyroglobulin antibody level was too low to measure. It is reasonable to reduce her level of TSH suppression.  2. Hypothyroid, post-surgical:  A. Her TFTs in March 2022 were mildly hyperthyroid.   B. She is having some sensations of being too hot, which could be due to hyperthyroidism or could be due to a dental infection.   C. It is reasonable to reduce her levothyroxine dose a small amount.  3. Broken right mandibular tooth and pain radiating inferiorly: She may well have a dental infection that is spreading. The only dentist we could find tin [place] that takes adult Medicaid is one with whom the patient had a poor experience previously. I called another dentist in [facility] [place], [facility], but had to leave a message asking them to call me back.  4. Obesity: She is moderately obese. She has lost two pounds since her last visit. We will discuss this issue further at future clinic visits.  5-7. Dyspepsia/nausea:/epigastric pains She appears to have had excess gastric acid production that cause her symptoms. Fortunately, after taking omeprazole for several months, her symptoms resolved.   6. Pallor of nail beds:  A. Her MCV and MCH were a little low in March, but her iron was normal.    B. The pallor not evident today.     PLAN:  1. Diagnostic: I reviewed all of her lab results and the [place] report from March 2022. Obtain TFTs at her next visit. 2. Therapeutic: Continue omeprazole, 20 mg twice daily. Reduce the levothyroxine to one tablet per day for six days per week, but take only 1/2 tablet on the seventh days. If she schedules a dental exam, I will prescribe antibiotics to tide her over. Otherwise she will need to go to an ED and be seen by a dentist/oral surgeon on call.  3. Patient education: We discussed all of the above at great length. Debra Finchum and  her husband appreciated all the time I took with them to review her record and explain everything to them.  4. Follow-up: 3 months  Level of Service: This visit lasted in excess of 50 minutes. More than 50% of the visit was devoted to counseling.  Tillman Sers, MD, CDE Adult and Pediatric Endocrinology

## 2020-12-12 ENCOUNTER — Encounter (HOSPITAL_COMMUNITY): Payer: Self-pay | Admitting: Emergency Medicine

## 2020-12-12 ENCOUNTER — Emergency Department (HOSPITAL_COMMUNITY)
Admission: EM | Admit: 2020-12-12 | Discharge: 2020-12-12 | Disposition: A | Discharge status: Home / Self Care | Payer: Medicaid Other | Attending: Emergency Medicine | Admitting: Emergency Medicine

## 2020-12-12 ENCOUNTER — Other Ambulatory Visit: Payer: Self-pay

## 2020-12-12 DIAGNOSIS — E890 Postprocedural hypothyroidism: Secondary | ICD-10-CM | POA: Insufficient documentation

## 2020-12-12 DIAGNOSIS — K047 Periapical abscess without sinus: Secondary | ICD-10-CM | POA: Diagnosis not present

## 2020-12-12 DIAGNOSIS — Z79899 Other long term (current) drug therapy: Secondary | ICD-10-CM | POA: Insufficient documentation

## 2020-12-12 DIAGNOSIS — K0889 Other specified disorders of teeth and supporting structures: Secondary | ICD-10-CM | POA: Diagnosis present

## 2020-12-12 DIAGNOSIS — Z85850 Personal history of malignant neoplasm of thyroid: Secondary | ICD-10-CM | POA: Insufficient documentation

## 2020-12-12 MED ORDER — AMOXICILLIN-POT CLAVULANATE 875-125 MG PO TABS
1.0000 | ORAL_TABLET | Freq: Once | ORAL | Status: AC
  Administered 2020-12-12: 1 via ORAL
  Filled 2020-12-12: qty 1

## 2020-12-12 MED ORDER — BENZOCAINE 10 % MT GEL: 1.0000 "application " | OROMUCOSAL | 0 refills | Status: AC | PRN

## 2020-12-12 MED ORDER — OXYCODONE-ACETAMINOPHEN 5-325 MG PO TABS
1.0000 | ORAL_TABLET | Freq: Once | ORAL | Status: AC
  Administered 2020-12-12: 1 via ORAL
  Filled 2020-12-12: qty 1

## 2020-12-12 MED ORDER — AMOXICILLIN-POT CLAVULANATE 875-125 MG PO TABS: 1.0000 | ORAL_TABLET | Freq: Two times a day (BID) | ORAL | 0 refills | Status: AC

## 2020-12-12 NOTE — ED Triage Notes (Signed)
Patient here from home reporting right lower dental pain due to "a cracked tooth".

## 2020-12-12 NOTE — ED Provider Notes (Signed)
[facility] DEPT Provider Note   CSN: [id number] Arrival date & time: 12/12/20  1757     History Chief Complaint  Patient presents with   Dental Pain    Debra Mills is a 35 y.o. female with hx of broken tooth on right lower jaw who presents with 2 days of worsening pain in that area. Denies systemic symptoms.   States she has dental appointment next month.   Personally reviewed patient's medical records.  She has history of hypothyroidism following thyroidectomy on Synthroid.  HPI     Past Medical History:  Diagnosis Date   Bleeds easily ([place])    when she has a cut.   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/20/2020   Obesity 07/20/2020   Dyspepsia 07/20/2020   Nausea without vomiting 07/20/2020   Pallor of nail bed 07/20/2020   S/P thyroidectomy 02/10/2018   Delivery with history of cesarean section 10/12/2016   Status post repeat low transverse cesarean section 10/12/2016   Enlarged thyroid gland 09/12/2016   Gestational diabetes, diet controlled 07/20/2016   HSV-2 infection complicating pregnancy, 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   Supervision of high risk pregnancy, antepartum, third trimester 06/26/2016   Previous cesarean section complicating pregnancy, antepartum condition or complication [id number]   Insufficient prenatal care 06/26/2016    Past Surgical History:  Procedure Laterality Date   CESAREAN SECTION N/A 09/28/2014   Procedure: CESAREAN SECTION;  Surgeon: Guss Bunde, MD;  Location: [facility];  Service: Obstetrics;  Laterality: N/A;   CESAREAN SECTION N/A 10/12/2016   Procedure: REPEAT CESAREAN SECTION;  Surgeon: Guss Bunde, MD;  Location: [facility];  Service: Obstetrics;  Laterality: N/A;   THYROID LOBECTOMY Right 04/21/2018   Procedure: RIGHT THYROID LOBECTOMY;  Surgeon: Ralene Ok, MD;  Location: [facility];  Service: General;  Laterality: Right;   THYROIDECTOMY Left 02/10/2018   THYROIDECTOMY Left 02/10/2018   Procedure: LEFT THYROIDECTOMY;  Surgeon: Ralene Ok, MD;  Location: [facility];  Service: General;  Laterality: Left;     OB History     Gravida  2   Para  1   Term  1   Preterm      AB      Living  2      SAB      IAB      Ectopic      Multiple  0   Live Births  2           Family History  Problem Relation Age of Onset   Diabetes Father    Kidney disease Father        benign tumor removed 1 kidney   Cancer Father    Thyroid disease Father    Birth defects Brother        heart defect    Social History   Tobacco Use   Smoking status: Never   Smokeless tobacco: Never  Vaping Use   Vaping Use: Never used  Substance Use Topics   Alcohol use: No   Drug use: No    Home Medications Prior to Admission medications   Medication Sig Start Date End Date Taking?  Authorizing Provider  amoxicillin-clavulanate (AUGMENTIN) 875-125 MG tablet Take 1 tablet by mouth every 12 (twelve) hours. 12/12/20  Yes Dontavian Marchi R, PA-C  benzocaine (ORAJEL) 10 % mucosal gel Use as directed 1 application in the mouth or throat as needed for mouth pain. 12/12/20  Yes Jaree Trinka, Gypsy Balsam, PA-C  acetaminophen (TYLENOL) 500 MG tablet Take 1,000 mg by mouth every 6 (six) hours as needed for moderate pain or headache.  Patient not taking: No sig reported    [provider]  calcium-vitamin D (OSCAL 500/200 D-3) 500-200 MG-UNIT tablet Take 1 tablet by mouth 2 (two) times daily. 04/22/18 04/22/19  Ralene Ok, MD  ciprofloxacin-dexamethasone [facility]) OTIC suspension Place four drops into both ears 2 (two) times daily. Patient not taking: Reported on 10/18/2020 03/01/20   [provider]  levothyroxine (SYNTHROID) 112 MCG tablet Take by mouth. 06/19/20   [provider]  Magnesium Oxide 200 MG TABS Take 1 tablet (200 mg total) by mouth daily. Patient not taking: No sig reported 04/22/18   Ralene Ok, MD  omeprazole (PRILOSEC) 20 MG capsule Take one capsule twice daily. 07/20/20 07/20/21  Sherrlyn Hock, MD  traMADol (ULTRAM) 50 MG tablet Take 1 tablet (50 mg total) by mouth every 6 (six) hours as needed (mild pain). Patient not taking: No sig reported 04/22/18   Ralene Ok, MD    Allergies    Fish allergy  Review of Systems   Review of Systems  Constitutional: Negative.   HENT:  Positive for dental problem. Negative for congestion, facial swelling and trouble swallowing.   Respiratory: Negative.    Cardiovascular: Negative.   Gastrointestinal: Negative.   Genitourinary: Negative.   Neurological: Negative.    Physical Exam Updated Vital Signs BP 111/77 (BP Location: Right Arm)   Pulse 74   Temp 98.4 F (36.9 C) (Oral)   Resp 18   SpO2 100%   Physical Exam Vitals and nursing note reviewed.  HENT:     Head: Normocephalic and atraumatic.     Mouth/Throat:     Mouth: Mucous membranes are moist.     Dentition: Abnormal dentition. Dental tenderness, gingival swelling and dental caries present.     Pharynx: Oropharynx is clear. Uvula midline. No oropharyngeal exudate or posterior oropharyngeal erythema.     Tonsils: No tonsillar exudate.      Comments: No sublingual or submental tenderness to palpation. Eyes:     General: Lids are normal. Vision grossly intact.        Right eye: No discharge.        Left eye: No discharge.     Extraocular Movements: Extraocular movements intact.     Conjunctiva/sclera: Conjunctivae normal.     Pupils: Pupils are equal, round, and reactive to light.  Neck:     Trachea: Trachea and phonation normal.  Cardiovascular:     Rate and Rhythm: Normal rate and regular rhythm.     Pulses: Normal pulses.      Heart sounds: Normal heart sounds. No murmur heard. Pulmonary:     Effort: Pulmonary effort is normal. No respiratory distress.     Breath sounds: Normal breath sounds. No wheezing or rales.  Abdominal:     General: Bowel sounds are normal. There is no distension.     Palpations: Abdomen is soft.     Tenderness: There is no abdominal tenderness.  Musculoskeletal:        General: No deformity.     Cervical back: Normal range of  motion and neck supple. No edema, rigidity or crepitus. No pain with movement, spinous process tenderness or muscular tenderness.  Lymphadenopathy:     Cervical: No cervical adenopathy.  Skin:    General: Skin is warm and dry.     Capillary Refill: Capillary refill takes less than 2 seconds.  Neurological:     General: No focal deficit present.     Mental Status: She is alert and oriented to person, place, and time. Mental status is at baseline.  Psychiatric:        Mood and Affect: Mood normal.    ED Results / Procedures / Treatments   Labs (all labs ordered are listed, but only abnormal results are displayed) Labs Reviewed - No data to display  EKG None  Radiology No results found.  Procedures Procedures   Medications Ordered in ED Medications  amoxicillin-clavulanate (AUGMENTIN) 875-125 MG per tablet 1 tablet (1 tablet Oral Given 12/12/20 2157)  oxyCODONE-acetaminophen (PERCOCET/ROXICET) 5-325 MG per tablet 1 tablet (1 tablet Oral Given 12/12/20 2156)    ED Course  I have reviewed the triage vital signs and the nursing notes.  Pertinent labs & imaging results that were available during my care of the patient were reviewed by me and considered in my medical decision making (see chart for details).    MDM Rules/Calculators/A&P                         35 year old female presents with concern for recent dental pain x2 days, history of the same.  Differential diagnosis includes not limited to.  Will abscess, dental infection, PTA, RPA, Ludewig's  angina.  Vital signs are normal intake.  Physical exam as above with evidence of fractured tooth with surrounding buccal mucosal gingival edema without abscess.  No sublingual or submental tenderness to palpation to suggest Ludwick's angina.  No further work-up warranted in ED at this time.  Physical exam and HPI most consistent with periapical infection without drainable abscess.  Will provide analgesia in the emergency department tonight as well as first dose of antibiotics and discharged with antibiotics and Orajel.  May use OTC analgesia as well.  Recommend close outpatient dental follow-up as discussed in the ED.  [place] voiced understanding of her medical evaluation and treatment plan.  Each of her questions answered to her expressed satisfaction.  Return precautions given.  Patient is well-appearing, stable, and appropriate for discharge at this time.  This chart was dictated using voice recognition software, Dragon. Despite the best efforts of this provider to proofread and correct errors, errors may still occur which can change documentation meaning.  Final Clinical Impression(s) / ED Diagnoses Final diagnoses:  Dental infection    Rx / DC Orders ED Discharge Orders          Ordered    benzocaine (ORAJEL) 10 % mucosal gel  As needed        12/12/20 2135    amoxicillin-clavulanate (AUGMENTIN) 875-125 MG tablet  Every 12 hours        12/12/20 2135             Suhey Radford, Gypsy Balsam, PA-C 12/12/20 2158    Daleen Bo, MD 12/13/20 1440

## 2020-12-12 NOTE — Discharge Instructions (Addendum)
You are seen in the ER today for your mouth pain.  You have been prescribed antibiotics to treat a dental infection.  Please take entire course of antibiotics as directed.  Continue using ibuprofen / Tylenol, as well as prescribed Orajel for pain.  You will need to follow-up with your dentist for continued management of this. Please see dental resources below. Return to the emergency department for fevers, swelling or pain under the tongue or in the neck, difficulty breathing or swallowing, nausea or vomiting that does not stop, or any other new or concerning symptoms.

## 2021-01-22 NOTE — Progress Notes (Deleted)
Subjective:  Patient Name: Debra Mills Date of Birth: Mar 29, 1986  MRN: [id number]  Debra Mills  presents to the office today for follow up evaluation and treatment of her thyroid cancer, post-surgical hypothyroidism., obesity, dyspepsia/epigastric pains, and pallor of her finger nail beds.  HISTORY OF PRESENT ILLNESS:   Debra Mills is a 35 y.o. [place] woman.   Debra Mills was accompanied by her husband, 36 y.o. son, 4 y.o.daughter.   1. Debra Mills had her initial endocrine consultation with me on 07/20/20:  A. In 2019 she had a large goiter. She saw Dr. Meyer Russel who arranged for her to have fine needle aspirate (FNA)The FNA was suspicious for cancer. She had a left hemi-thyroidectomy on 10/082019 at [facility]. Her surgeon was Dr. Ralene Ok. She had a tumor that was 7.5 cm in largest dimension, but was limited to the thyroid. 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 The tumor was classified as pT3a,pNO. She had her right lobe resected on 04/22/18.  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 Because she was felt to be at low risk for cancer recurrence, she did not have any I-131 treatment thereafter. She has been treated with thyroid hormone, but the levels have varied. Her post-operative thyroglobulin level in January 2020 was 0.7. She saw Dr. Hartford Poli for her last post-operative visit on 11/25/18. In-office [place] did not show any residual tissue. Dr. Hartford Poli ordered follow up lab tests, but Debra. Mills chose not to follow up with him.   B. Pertinent past medical history:   1). Medical problems: Obesity; Hair is coming out. She has had ear infections. She has been tremulous, tachycardic, and short of breath with activity.    2). Surgeries: C-section, thyroidectomy    3). Allergies: No  known medication allergies   4). GYN: LMP 07/01/20. Menses occur regularly. She has two children.    5). Medications: Ciprofloxacin-dexamethasone ear drops daily; levothyroxine, 112 mcg/day  C. Pertinent family history   1). Obesity: None   2). DM: Father takes pills.   3). Thyroid disease: Father had thyroid surgery, but not cancer.    4). ASCVD: None   5). Cancers:  Brother has lymphoma of his neck.    6). Others: None  2. Debra. Mills' last endocrine clinic visit occurred on 10/18/20. I continued her on omeprazole, 20 mg, twice daily. I asked her to take one 112 mcg levothyroxine tablet per day for 6 days each week, but take only one-half tablet on the seventh days.   A. In the interim she had been healthy until 12/12/20 when she presented to the ED for dental infection. She was discharged on Augmentin.   B. Her excess belly hunger and epigastric pains have resolved.   C. She continues to take 112 mcg/day of levothyroxine.   D. She suffered a broken right mandibular tooth about a week ago. She has pain in her right mandible that radiates down her neck to the area of her residual thyroid tissue.   3. Pertinent Review of Systems:  Constitutional: The patient feels good, but tired. She does not have any trouble falling asleep or staying asleep. She often feels too hot.  Eyes: Vision is good. There are no significant eye complaints. Neck: The patient has complaints of right anterior neck pains that come and go. She does not have any  swelling, pressure, or difficulty swallowing.  Heart: Heart rate increases with exercise or other physical activity. The patient has no complaints of palpitations, irregular heat beats, chest pain, or chest pressure. Gastrointestinal: As above. Bowel movents seem normal. The patient no longer has occasional nausea,. She no longer has excessive belly hunger and epigastric pains.  Hands: She does not have any tremor.  She still sometimes has pains in the dorsa of her hands  when she uses a knife to cut something.  Legs: Muscle mass and strength seem normal. There are no complaints of numbness, tingling, burning, or pain. No edema is noted. Feet: There are no obvious foot problems. There are no complaints of numbness, tingling, burning, or pain. No edema is noted. GYN: LMP 09/28/20. Normal monthly cycles.   PAST MEDICAL, FAMILY, AND SOCIAL HISTORY:  Past Medical History:  Diagnosis Date   Bleeds easily ([place])    when she has a cut.   Dizziness    on occasion : none today had some dizzines after thyroid surgery this past october    Dyspnea    Gestational diabetes    Goiter    Headache     Family History  Problem Relation Age of Onset   Diabetes Father    Kidney disease Father        benign tumor removed 1 kidney   Cancer Father    Thyroid disease Father    Birth defects Brother        heart defect     Current Outpatient Medications:    acetaminophen (TYLENOL) 500 MG tablet, Take 1,000 mg by mouth every 6 (six) hours as needed for moderate pain or headache.  (Patient not taking: No sig reported), Disp: , Rfl:    amoxicillin-clavulanate (AUGMENTIN) 875-125 MG tablet, Take 1 tablet by mouth every 12 (twelve) hours., Disp: 14 tablet, Rfl: 0   benzocaine (ORAJEL) 10 % mucosal gel, Use as directed 1 application in the mouth or throat as needed for mouth pain., Disp: 5.3 g, Rfl: 0   calcium-vitamin D (OSCAL 500/200 D-3) 500-200 MG-UNIT tablet, Take 1 tablet by mouth 2 (two) times daily., Disp: 180 tablet, Rfl: 3   ciprofloxacin-dexamethasone (CIPRODEX) OTIC suspension, Place four drops into both ears 2 (two) times daily. (Patient not taking: Reported on 10/18/2020), Disp: , Rfl:    levothyroxine (SYNTHROID) 112 MCG tablet, Take by mouth., Disp: , Rfl:    Magnesium Oxide 200 MG TABS, Take 1 tablet (200 mg total) by mouth daily. (Patient not taking: No sig reported), Disp: 30 tablet, Rfl: 0   omeprazole (PRILOSEC) 20 MG capsule, Take one capsule twice daily.,  Disp: 60 capsule, Rfl: 5   traMADol (ULTRAM) 50 MG tablet, Take 1 tablet (50 mg total) by mouth every 6 (six) hours as needed (mild pain). (Patient not taking: No sig reported), Disp: 20 tablet, Rfl: 0  Allergies as of 01/23/2021 - Review Complete 12/12/2020  Allergen Reaction Noted   Fish allergy Hives 07/06/2014    1. 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  2. Activities: Sedentary 3. Smoking, alcohol, or drugs: None 4. Primary Care Provider: Mindi Curling, PA-C  REVIEW OF SYSTEMS: There are no other significant problems involving [place]'s other body systems.   Objective:  Vital Signs:  There were no vitals taken for this visit. HR was 84.Temperature was 97.6 degrees.    Ht Readings from Last 3 Encounters:  05/01/18 '5\' 1"'$  (1.549 m)  04/21/18 '5\' 1"'$  (1.549 m)  02/10/18 '5\' 1"'$  (1.549 m)   Wt Readings from Last 3 Encounters:  10/18/20 173 lb 6.4 oz (78.7 kg)  07/20/20 175 lb (79.4 kg)  05/01/18 174 lb 2.6 oz (79 kg)   HC Readings from Last 3 Encounters:  No data found for HC   There is no height or weight on file to calculate BSA.  PHYSICAL EXAM:  Constitutional: The patient appears healthy, but moderately obese. She has lost two pounds Face: The face appears normal, except for her right mandible being somewhat swollen and tender to percussion, but not erythematous.   Eyes: There is no obvious arcus or proptosis. Moisture appears normal. Mouth: The oropharynx and tongue appear normal. Oral moisture is normal. There is no tongue tremor.  Neck: The neck appears to be visibly normal. No carotid bruits are noted. The thyroid gland is absent. She has palpable induration of her strap muscles bilaterally. She also has tenderness radiating downward from the right mandible to the thyroid bed. I do not palpate any abnormal nodes or subclavian masses. Lungs: The lungs are clear to auscultation. Air movement is good. Heart: Heart rate and rhythm  are regular. Heart sounds S1 and S2 are normal. I did not appreciate any pathologic cardiac murmurs. Abdomen: The abdomen is obese. Bowel sounds are normal. There is no obvious hepatomegaly, splenomegaly, or other mass effect. There is no tenderness to palpation. Arms: Muscle size and bulk are normal for age. Hands: There is no obvious tremor. Phalangeal and metacarpophalangeal joints are normal. Palmar muscles are normal. Palmar skin is normal. Palmar moisture is also normal. She has no pallor of her nail beds.  Legs: Muscles appear normal for age. No edema is present. Neurologic: Strength is normal for age in both the upper and lower extremities. Muscle tone is normal. Sensation to touch is normal in both legs.   LAB DATA:  No results found for this or any previous visit (from the past 504 hour(s)).  LABS 07/21/20: HbA1c 5.6%; TSH 0.41, free T4 1.4, free T3 3.3, thyroglobulin 0.1, thyroglobulin antibody <1; CMP normal; CBC norm al, except WBC 11.3 (ref 3.8-10.8), MCV 79.2 (ref 80-100), MCH 25.9 (ref 27-33); iron 92  IMAGING:  US thyroid 07/21/20: The left lobe and isthmus are surgically absent. There is no residual thyroid tissue in the isthmus resection bed or the left lobectomy resection bed. In the right lobectomy resection bed there is a 9 x 6 x 5 mm isoechoic nodular soft tissue area, potentially representative of residual thyroid parenchyma.    Assessment and Plan:   ASSESSMENT:  1. Papillary thyroid cancer, follicular variant:  A. Debra Mills had the appropriate surgery for her thyroid cancer. Because there was no recognized extrathyroidal extension, vascular invasion, or nodal involvement, she was considered to be at low risk of cancer recurrence. Therefore she was not given any I-131 irradiation.   B. At her initial visit with me in March 2022 I felt more induration of the strap muscles than usual 15 months postoperatively. The right strap muscle was painful. Since that lobe had not been  known to have cancer, I felt that the area of right neck tenderness might represent some inflamed scar tissue. I ordered an [place] to better investigate the issue.   C. When I saw the results of her [place], I then knew that there was a small area of residual thyroid tissue in the area that had felt tender in March. From her path report I  also knew that the right lobe tissue showed evidence of lymphocytic (Hashimoto's) thyroiditis. This form of thyroiditis is often painful.   D. Her thyroglobulin (Tg) level in March 2022 was very low, c/w her having very minimal thyroid tissue. This Tg level could be due to residual thyroid tissue alone, or to a combination of residual normal thyroid tissue and some cancer. However, because the remainder of her right lobe was benign, and there was no recognized spread of cancer outside the left lobe, the risk of cancer in the residual right lobe tissue or left thyroid bed is exceedingly low. Her thyroglobulin antibody level was too low to measure. It is reasonable to reduce her level of TSH suppression.  2. Hypothyroid, post-surgical:  A. Her TFTs in March 2022 were mildly hyperthyroid.   B. She is having some sensations of being too hot, which could be due to hyperthyroidism or could be due to a dental infection.   C. It is reasonable to reduce her levothyroxine dose a small amount.  3. Broken right mandibular tooth and pain radiating inferiorly: She may well have a dental infection that is spreading. The only dentist we could find tin [place] that takes adult Medicaid is one with whom the patient had a poor experience previously. I called another dentist in [facility], [facility], but had to leave a message asking them to call me back.  4. Obesity: She is moderately obese. She has lost two pounds since her last visit. We will discuss this issue further at future clinic visits.  5-7. Dyspepsia/nausea:/epigastric pains She appears to have had excess gastric acid production  that cause her symptoms. Fortunately, after taking omeprazole for several months, her symptoms resolved.   6. Pallor of nail beds:  A. Her MCV and MCH were a little low in March, but her iron was normal.    B. The pallor not evident today.    PLAN:  1. Diagnostic: I reviewed all of her lab results and the [place] report from March 2022. Obtain TFTs at her next visit. 2. Therapeutic: Continue omeprazole, 20 mg twice daily. Reduce the levothyroxine to one tablet per day for six days per week, but take only 1/2 tablet on the seventh days. If she schedules a dental exam, I will prescribe antibiotics to tide her over. Otherwise she will need to go to an ED and be seen by a dentist/oral surgeon on call.  3. Patient education: We discussed all of the above at great length. Debra Mills and  her husband appreciated all the time I took with them to review her record and explain everything to them.  4. Follow-up: 3 months  Level of Service: This visit lasted in excess of 50 minutes. More than 50% of the visit was devoted to counseling.  Tillman Sers, MD, CDE Adult and Pediatric Endocrinology

## 2021-01-23 ENCOUNTER — Ambulatory Visit (INDEPENDENT_AMBULATORY_CARE_PROVIDER_SITE_OTHER): Payer: Medicaid Other | Admitting: "Endocrinology

## 2021-09-16 ENCOUNTER — Encounter (HOSPITAL_COMMUNITY): Payer: Self-pay

## 2021-09-16 ENCOUNTER — Emergency Department (HOSPITAL_COMMUNITY): Payer: Medicaid Other

## 2021-09-16 ENCOUNTER — Emergency Department (HOSPITAL_COMMUNITY)
Admission: EM | Admit: 2021-09-16 | Discharge: 2021-09-16 | Disposition: A | Discharge status: Home / Self Care | Payer: Medicaid Other | Attending: Emergency Medicine | Admitting: Emergency Medicine

## 2021-09-16 DIAGNOSIS — D72829 Elevated white blood cell count, unspecified: Secondary | ICD-10-CM | POA: Insufficient documentation

## 2021-09-16 DIAGNOSIS — Z79899 Other long term (current) drug therapy: Secondary | ICD-10-CM | POA: Diagnosis not present

## 2021-09-16 DIAGNOSIS — K8020 Calculus of gallbladder without cholecystitis without obstruction: Secondary | ICD-10-CM | POA: Diagnosis not present

## 2021-09-16 DIAGNOSIS — E039 Hypothyroidism, unspecified: Secondary | ICD-10-CM | POA: Insufficient documentation

## 2021-09-16 DIAGNOSIS — R109 Unspecified abdominal pain: Secondary | ICD-10-CM | POA: Diagnosis present

## 2021-09-16 LAB — CBC WITH DIFFERENTIAL/PLATELET
Abs Immature Granulocytes: 0.06 10*3/uL (ref 0.00–0.07)
Basophils Absolute: 0.1 10*3/uL (ref 0.0–0.1)
Basophils Relative: 0 %
Eosinophils Absolute: 0.7 10*3/uL — ABNORMAL HIGH (ref 0.0–0.5)
Eosinophils Relative: 6 %
HCT: 39.7 % (ref 36.0–46.0)
Hemoglobin: 13.3 g/dL (ref 12.0–15.0)
Immature Granulocytes: 1 %
Lymphocytes Relative: 28 %
Lymphs Abs: 3.2 10*3/uL (ref 0.7–4.0)
MCH: 27 pg (ref 26.0–34.0)
MCHC: 33.5 g/dL (ref 30.0–36.0)
MCV: 80.7 fL (ref 80.0–100.0)
Monocytes Absolute: 0.8 10*3/uL (ref 0.1–1.0)
Monocytes Relative: 7 %
Neutro Abs: 6.7 10*3/uL (ref 1.7–7.7)
Neutrophils Relative %: 58 %
Platelets: 204 10*3/uL (ref 150–400)
RBC: 4.92 MIL/uL (ref 3.87–5.11)
RDW: 14 % (ref 11.5–15.5)
WBC: 11.5 10*3/uL — ABNORMAL HIGH (ref 4.0–10.5)
nRBC: 0 % (ref 0.0–0.2)

## 2021-09-16 LAB — URINALYSIS, ROUTINE W REFLEX MICROSCOPIC
Bilirubin Urine: NEGATIVE
Glucose, UA: NEGATIVE mg/dL
Hgb urine dipstick: NEGATIVE
Ketones, ur: NEGATIVE mg/dL
Leukocytes,Ua: NEGATIVE
Nitrite: NEGATIVE
Protein, ur: NEGATIVE mg/dL
Specific Gravity, Urine: 1.012 (ref 1.005–1.030)
pH: 6 (ref 5.0–8.0)

## 2021-09-16 LAB — COMPREHENSIVE METABOLIC PANEL
ALT: 36 U/L (ref 0–44)
AST: 30 U/L (ref 15–41)
Albumin: 4.2 g/dL (ref 3.5–5.0)
Alkaline Phosphatase: 62 U/L (ref 38–126)
Anion gap: 5 (ref 5–15)
BUN: 13 mg/dL (ref 6–20)
CO2: 28 mmol/L (ref 22–32)
Calcium: 9.6 mg/dL (ref 8.9–10.3)
Chloride: 103 mmol/L (ref 98–111)
Creatinine, Ser: 0.71 mg/dL (ref 0.44–1.00)
GFR, Estimated: >60 mL/min (ref >60)
Glucose, Bld: 109 mg/dL — ABNORMAL HIGH (ref 70–99)
Potassium: 3.8 mmol/L (ref 3.5–5.1)
Sodium: 136 mmol/L (ref 135–145)
Total Bilirubin: 0.6 mg/dL (ref 0.3–1.2)
Total Protein: 7.9 g/dL (ref 6.5–8.1)

## 2021-09-16 LAB — POC URINE PREG, ED: Preg Test, Ur: NEGATIVE

## 2021-09-16 LAB — LIPASE, BLOOD: Lipase: 34 U/L (ref 11–51)

## 2021-09-16 LAB — PREGNANCY, URINE: Preg Test, Ur: NEGATIVE

## 2021-09-16 MED ORDER — SODIUM CHLORIDE 0.9 % IV BOLUS
1000.0000 mL | Freq: Once | INTRAVENOUS | Status: AC
  Administered 2021-09-16: 1000 mL via INTRAVENOUS

## 2021-09-16 MED ORDER — HYDROCODONE-ACETAMINOPHEN 5-325 MG PO TABS: 1.0000 | ORAL_TABLET | ORAL | 0 refills | Status: AC | PRN

## 2021-09-16 MED ORDER — ONDANSETRON 4 MG PO TBDP: 4.0000 mg | ORAL_TABLET | Freq: Three times a day (TID) | ORAL | 0 refills | Status: AC | PRN

## 2021-09-16 MED ORDER — ONDANSETRON HCL 4 MG/2ML IJ SOLN
4.0000 mg | Freq: Once | INTRAMUSCULAR | Status: AC
  Administered 2021-09-16: 4 mg via INTRAVENOUS
  Filled 2021-09-16: qty 2

## 2021-09-16 NOTE — ED Provider Notes (Signed)
?[facility] DEPT ?Provider Note ? ? ?CSN: [id number] ?Arrival date & time: 09/16/21  1627 ? ?  ? ?History ? ?Chief Complaint  ?Patient presents with  ? Flank Pain  ? ? ?Debra Mills is a 36 y.o. female. ? ?Pt is a 36 yo female with a pmhx significant for hypothyroidism, gerd, and cholecystitis.  Pt has been having RUQ pain for a week.  She did see her PCP on 5/5 for the same and was put on omeprazole.  She did have an [place] in Feb which showed multiple gallstones.  The pt said pain is worsened.  No fevers. + nausea. ? ? ?  ? ?Home Medications ?Prior to Admission medications   ?Medication Sig Start Date End Date Taking? Authorizing Provider  ?HYDROcodone-acetaminophen (NORCO/VICODIN) 5-325 MG tablet Take 1 tablet by mouth every 4 (four) hours as needed. 09/16/21  Yes Isla Pence, MD  ?ondansetron (ZOFRAN-ODT) 4 MG disintegrating tablet Take 1 tablet (4 mg total) by mouth every 8 (eight) hours as needed for nausea or vomiting. 09/16/21  Yes Isla Pence, MD  ?acetaminophen (TYLENOL) 500 MG tablet Take 1,000 mg by mouth every 6 (six) hours as needed for moderate pain or headache.  ?Patient not taking: No sig reported    [provider]  ?amoxicillin-clavulanate (AUGMENTIN) 875-125 MG tablet Take 1 tablet by mouth every 12 (twelve) hours. 12/12/20   Sponseller, Gypsy Balsam, PA-C  ?benzocaine (ORAJEL) 10 % mucosal gel Use as directed 1 application in the mouth or throat as needed for mouth pain. 12/12/20   Sponseller, Eugene Garnet R, PA-C  ?calcium-vitamin D (OSCAL 500/200 D-3) 500-200 MG-UNIT tablet Take 1 tablet by mouth 2 (two) times daily. 04/22/18 04/22/19  Ralene Ok, MD  ?ciprofloxacin-dexamethasone [facility]) OTIC suspension Place four drops into both ears 2 (two) times daily. ?Patient not taking: Reported on 10/18/2020 03/01/20   [provider]  ?levothyroxine (SYNTHROID) 112 MCG tablet Take by mouth. 06/19/20   [provider]  ?Magnesium Oxide 200 MG TABS Take  1 tablet (200 mg total) by mouth daily. ?Patient not taking: No sig reported 04/22/18   Ralene Ok, MD  ?omeprazole (PRILOSEC) 20 MG capsule Take one capsule twice daily. 07/20/20 07/20/21  Sherrlyn Hock, MD  ?traMADol (ULTRAM) 50 MG tablet Take 1 tablet (50 mg total) by mouth every 6 (six) hours as needed (mild pain). ?Patient not taking: No sig reported 04/22/18   Ralene Ok, MD  ?   ? ?Allergies    ?Fish allergy   ? ?Review of Systems   ?Review of Systems  ?Gastrointestinal:  Positive for abdominal pain and nausea.  ?All other systems reviewed and are negative. ? ?Physical Exam ?Updated Vital Signs ?BP 103/64 (BP Location: Right Arm)   Pulse 76   Temp 98.2 ?F (36.8 ?C) (Oral)   Resp 14   SpO2 98%  ?Physical Exam ?Vitals and nursing note reviewed.  ?Constitutional:   ?   Appearance: Normal appearance.  ?HENT:  ?   Head: Normocephalic and atraumatic.  ?   Right Ear: External ear normal.  ?   Left Ear: External ear normal.  ?   Nose: Nose normal.  ?   Mouth/Throat:  ?   Mouth: Mucous membranes are moist.  ?   Pharynx: Oropharynx is clear.  ?Eyes:  ?   Extraocular Movements: Extraocular movements intact.  ?   Conjunctiva/sclera: Conjunctivae normal.  ?   Pupils: Pupils are equal, round, and reactive to light.  ?Cardiovascular:  ?  Rate and Rhythm: Normal rate and regular rhythm.  ?   Pulses: Normal pulses.  ?   Heart sounds: Normal heart sounds.  ?Pulmonary:  ?   Effort: Pulmonary effort is normal.  ?   Breath sounds: Normal breath sounds.  ?Abdominal:  ?   General: Abdomen is flat. Bowel sounds are normal.  ?   Palpations: Abdomen is soft.  ?   Tenderness: There is abdominal tenderness in the right upper quadrant and epigastric area.  ?Musculoskeletal:     ?   General: Normal range of motion.  ?   Cervical back: Normal range of motion and neck supple.  ?Skin: ?   General: Skin is warm.  ?   Capillary Refill: Capillary refill takes less than 2 seconds.  ?Neurological:  ?   General: No focal  deficit present.  ?   Mental Status: She is alert and oriented to person, place, and time.  ?Psychiatric:     ?   Mood and Affect: Mood normal.     ?   Behavior: Behavior normal.  ? ? ?ED Results / Procedures / Treatments   ?Labs ?(all labs ordered are listed, but only abnormal results are displayed) ?Labs Reviewed  ?CBC WITH DIFFERENTIAL/PLATELET - Abnormal; Notable for the following components:  ?    Result Value  ? WBC 11.5 (*)   ? Eosinophils Absolute 0.7 (*)   ? All other components within normal limits  ?COMPREHENSIVE METABOLIC PANEL - Abnormal; Notable for the following components:  ? Glucose, Bld 109 (*)   ? All other components within normal limits  ?LIPASE, BLOOD  ?URINALYSIS, ROUTINE W REFLEX MICROSCOPIC  ?PREGNANCY, URINE  ?POC URINE PREG, ED  ? ? ?EKG ?None ? ?Radiology ?US Abdomen Limited RUQ (LIVER/GB) ? ?Result Date: 09/16/2021 ?CLINICAL DATA:  Right upper quadrant pain for 2 weeks EXAM: ULTRASOUND ABDOMEN LIMITED RIGHT UPPER QUADRANT COMPARISON:  None Available. FINDINGS: Gallbladder: Multiple shadowing gallstones are seen filling the gallbladder lumen, measuring up to 1 cm in size. No gallbladder wall thickening or pericholecystic fluid. Negative sonographic Murphy sign. Common bile duct: Diameter: 3 mm Liver: No focal lesion identified. Within normal limits in parenchymal echogenicity. Portal vein is patent on color Doppler imaging with normal direction of blood flow towards the liver. Other: None. IMPRESSION: 1. Cholelithiasis without cholecystitis. Electronically Signed   By: Randa Ngo M.D.   On: 09/16/2021 18:10   ? ?Procedures ?Procedures  ? ? ?Medications Ordered in ED ?Medications  ?sodium chloride 0.9 % bolus 1,000 mL (0 mLs Intravenous Stopped 09/16/21 1812)  ?ondansetron (ZOFRAN) injection 4 mg (4 mg Intravenous Given 09/16/21 1700)  ? ? ?ED Course/ Medical Decision Making/ A&P ?  ?                        ?Medical Decision Making ?Amount and/or Complexity of Data Reviewed ?Labs:  ordered. ?Radiology: ordered. ? ?Risk ?Prescription drug management. ? ? ?This patient presents to the ED for concern of abd pain, this involves an extensive number of treatment options, and is a complaint that carries with it a high risk of complications and morbidity.  The differential diagnosis includes cholecystitis, appendicitis, pregnancy ? ? ?Co morbidities that complicate the patient evaluation ? ?Hypothyroidism, gerd ? ? ?Additional history obtained: ? ?Additional history obtained from epic chart review ? ?Lab Tests: ? ?I Ordered, and personally interpreted labs.  The pertinent results include:  cbc with slightly elevated wbc 11.5; cmp is neg; ua  neg ? ? ?Imaging Studies ordered: ? ?I ordered imaging studies including RUQ [place]  ?I independently visualized and interpreted imaging which showed  ?  ?IMPRESSION:  ?1. Cholelithiasis without cholecystitis.  ? ?I agree with the radiologist interpretation ? ? ?Cardiac Monitoring: ? ?The patient was maintained on a cardiac monitor.  I personally viewed and interpreted the cardiac monitored which showed an underlying rhythm of: nsr ? ? ?Medicines ordered and prescription drug management: ? ?I ordered medication including IVFs and zofran  for dehydration and nausea  ?Reevaluation of the patient after these medicines showed that the patient improved ?I have reviewed the patients home medicines and have made adjustments as needed ? ? ?Test Considered: ? ?GB US ? ? ?Problem List / ED Course: ? ?Cholelithiasis:  Pt's pain is under control.  There is no evidence of cholecystitis or biliary duct obstruction.  Pt has been having pain intermittently for several months.  She is given the number of surgery to f/u.  Pt encouraged to eat low fat and greasy foods. ? ? ?Reevaluation: ? ?After the interventions noted above, I reevaluated the patient and found that they have :improved ? ? ?Social Determinants of Health: ? ?Lives at home with husband ? ? ?Dispostion: ? ?After  consideration of the diagnostic results and the patients response to treatment, I feel that the patent would benefit from discharge with outpatient f/u.   ? ? ? ? ? ? ? ?Final Clinical Impression(s) / ED Diagnoses ?Final diagno

## 2021-09-16 NOTE — ED Triage Notes (Signed)
Pt arrived via POV, c/o right sided flank pain x1 week. Pain progressively getting worse. Denies any dysuria. C/o nausea.  ?

## 2021-12-06 ENCOUNTER — Encounter (INDEPENDENT_AMBULATORY_CARE_PROVIDER_SITE_OTHER): Payer: Self-pay

## 2021-12-07 ENCOUNTER — Encounter: Payer: Self-pay | Admitting: Physician Assistant

## 2023-11-14 ENCOUNTER — Encounter (HOSPITAL_COMMUNITY): Payer: Self-pay

## 2023-11-14 ENCOUNTER — Emergency Department (HOSPITAL_COMMUNITY)
Admission: EM | Admit: 2023-11-14 | Discharge: 2023-11-14 | Disposition: A | Discharge status: Home / Self Care | Attending: Emergency Medicine | Admitting: Emergency Medicine

## 2023-11-14 ENCOUNTER — Other Ambulatory Visit: Payer: Self-pay

## 2023-11-14 DIAGNOSIS — R946 Abnormal results of thyroid function studies: Secondary | ICD-10-CM | POA: Diagnosis not present

## 2023-11-14 DIAGNOSIS — Z85850 Personal history of malignant neoplasm of thyroid: Secondary | ICD-10-CM | POA: Insufficient documentation

## 2023-11-14 DIAGNOSIS — N912 Amenorrhea, unspecified: Secondary | ICD-10-CM | POA: Insufficient documentation

## 2023-11-14 DIAGNOSIS — R7989 Other specified abnormal findings of blood chemistry: Secondary | ICD-10-CM

## 2023-11-14 LAB — BASIC METABOLIC PANEL WITH GFR
Anion gap: 8 (ref 5–15)
BUN: 9 mg/dL (ref 6–20)
CO2: 24 mmol/L (ref 22–32)
Calcium: 9.7 mg/dL (ref 8.9–10.3)
Chloride: 104 mmol/L (ref 98–111)
Creatinine, Ser: 0.7 mg/dL (ref 0.44–1.00)
GFR, Estimated: >60 mL/min (ref >60)
Glucose, Bld: 115 mg/dL — ABNORMAL HIGH (ref 70–99)
Potassium: 3.7 mmol/L (ref 3.5–5.1)
Sodium: 136 mmol/L (ref 135–145)

## 2023-11-14 LAB — CBC WITH DIFFERENTIAL/PLATELET
Abs Immature Granulocytes: 0.08 K/uL — ABNORMAL HIGH (ref 0.00–0.07)
Basophils Absolute: 0 K/uL (ref 0.0–0.1)
Basophils Relative: 0 %
Eosinophils Absolute: 0.2 K/uL (ref 0.0–0.5)
Eosinophils Relative: 2 %
HCT: 39.7 % (ref 36.0–46.0)
Hemoglobin: 13.6 g/dL (ref 12.0–15.0)
Immature Granulocytes: 1 %
Lymphocytes Relative: 21 %
Lymphs Abs: 2.8 K/uL (ref 0.7–4.0)
MCH: 27.5 pg (ref 26.0–34.0)
MCHC: 34.3 g/dL (ref 30.0–36.0)
MCV: 80.4 fL (ref 80.0–100.0)
Monocytes Absolute: 0.8 K/uL (ref 0.1–1.0)
Monocytes Relative: 6 %
Neutro Abs: 9.6 K/uL — ABNORMAL HIGH (ref 1.7–7.7)
Neutrophils Relative %: 70 %
Platelets: 227 K/uL (ref 150–400)
RBC: 4.94 MIL/uL (ref 3.87–5.11)
RDW: 12.9 % (ref 11.5–15.5)
WBC: 13.6 K/uL — ABNORMAL HIGH (ref 4.0–10.5)
nRBC: 0 % (ref 0.0–0.2)

## 2023-11-14 LAB — TSH: TSH: 10.49 u[IU]/mL — ABNORMAL HIGH (ref 0.350–4.500)

## 2023-11-14 LAB — HCG, QUANTITATIVE, PREGNANCY: hCG, Beta Chain, Quant, S: 18878 m[IU]/mL — ABNORMAL HIGH (ref <5)

## 2023-11-14 NOTE — ED Provider Notes (Addendum)
 [place] EMERGENCY DEPARTMENT AT [facility] Provider Note   CSN: [id number] Arrival date & time: 11/14/23  1240     Patient presents with: Amenorrhea   Debra Mills is a 38 y.o. female.   HPI    38 year old female comes in with chief complaint of missed period. Patient has history of thyroid  cancer status post thyroidectomy, currently on levothyroxine. Patient states that she had her LMP on May 25.  Around that time she was also started on Synthroid, which is the same dose, but the pill is shaped differently.  End of June, she did not have any period.  She took home pregnancy test which was negative, and with her having no menstrual cycle, she decided to come to the ER.  Review of system is negative for any severe headaches, abdominal pain, vaginal discharge.  Patient does have a clinic where she goes, but her PCP is leaving, she is awaiting being assigned to a new PCP.   Prior to Admission medications   Medication Sig Start Date End Date Taking? Authorizing Provider  acetaminophen  (TYLENOL ) 500 MG tablet Take 1,000 mg by mouth every 6 (six) hours as needed for moderate pain or headache.  Patient not taking: No sig reported    [provider]  amoxicillin -clavulanate (AUGMENTIN ) 875-125 MG tablet Take 1 tablet by mouth every 12 (twelve) hours. 12/12/20   Sponseller, Pleasant R, PA-C  benzocaine  (ORAJEL) 10 % mucosal gel Use as directed 1 application in the mouth or throat as needed for mouth pain. 12/12/20   Sponseller, Pleasant R, PA-C  calcium -vitamin D  (OSCAL 500/200 D-3) 500-200 MG-UNIT tablet Take 1 tablet by mouth 2 (two) times daily. 04/22/18 04/22/19  Rubin Calamity, MD  ciprofloxacin -dexamethasone  (CIPRODEX) OTIC suspension Place four drops into both ears 2 (two) times daily. Patient not taking: Reported on 10/18/2020 03/01/20   [provider]  HYDROcodone -acetaminophen  (NORCO/VICODIN) 5-325 MG tablet Take 1 tablet by mouth every 4 (four) hours as  needed. 09/16/21   Haviland, Julie, MD  levothyroxine (SYNTHROID) 112 MCG tablet Take by mouth. 06/19/20   [provider]  Magnesium  Oxide 200 MG TABS Take 1 tablet (200 mg total) by mouth daily. Patient not taking: No sig reported 04/22/18   Rubin Calamity, MD  omeprazole  (PRILOSEC) 20 MG capsule Take one capsule twice daily. 07/20/20 07/20/21  Hershal Ozell PARAS, MD  ondansetron  (ZOFRAN -ODT) 4 MG disintegrating tablet Take 1 tablet (4 mg total) by mouth every 8 (eight) hours as needed for nausea or vomiting. 09/16/21   Dean Clarity, MD  traMADol  (ULTRAM ) 50 MG tablet Take 1 tablet (50 mg total) by mouth every 6 (six) hours as needed (mild pain). Patient not taking: No sig reported 04/22/18   Rubin Calamity, MD    Allergies: Fish allergy    Review of Systems  All other systems reviewed and are negative.   Updated Vital Signs BP 127/79 (BP Location: Right Arm)   Pulse 86   Temp 97.6 F (36.4 C) (Oral)   Resp 15   Ht 5' 1 (1.549 m)   LMP 09/29/2023   SpO2 100%   BMI 32.76 kg/m   Physical Exam Vitals and nursing note reviewed.  Constitutional:      Appearance: She is well-developed.  HENT:     Head: Atraumatic.  Cardiovascular:     Rate and Rhythm: Normal rate.  Pulmonary:     Effort: Pulmonary effort is normal.  Musculoskeletal:     Cervical back: Normal range of motion and  neck supple.  Skin:    General: Skin is warm and dry.  Neurological:     Mental Status: She is alert and oriented to person, place, and time.     (all labs ordered are listed, but only abnormal results are displayed) Labs Reviewed  CBC WITH DIFFERENTIAL/PLATELET - Abnormal; Notable for the following components:      Result Value   WBC 13.6 (*)    Neutro Abs 9.6 (*)    Abs Immature Granulocytes 0.08 (*)    All other components within normal limits  TSH - Abnormal; Notable for the following components:   TSH 10.490 (*)    All other components within normal limits  BASIC METABOLIC  PANEL WITH GFR  HCG, QUANTITATIVE, PREGNANCY    EKG: None  Radiology: No results found.   Procedures   Medications Ordered in the ED - No data to display                                  Medical Decision Making Amount and/or Complexity of Data Reviewed Labs: ordered.   38 year old patient comes in with chief complaint of missed menstrual cycle.  She has no abdominal pain, vaginal bleeding or discharge.  She had negative home pregnancy test.  Patient does have history of thyroid  cancer, status post thyroidectomy, she is on levothyroxine.  No dose change, but it appears that the shape of the pill has changed, which made patient get concerned.  Differential diagnosis includes early pregnancy, severe hypothyroidism, abnormality with the hypothalamus/pituitary/gonadal axis.  We will get serum hCG and TSH.  I discussed with the patient that if the workup in the ER is reassuring, then she will need to see PCP as they will need to check hormone levels.  Currently patient does not have any headache, abdominal pain to proceed with any additional testing.  3:23 PM Patient's hCG is positive.  Results of the ED workup discussed with her. OB information provided.  Final diagnoses:  Elevated TSH    ED Discharge Orders     None           Charlyn Sora, MD 11/14/23 1540

## 2023-11-14 NOTE — Discharge Instructions (Addendum)
 You were seen in the emergency room for missed period. The workup in the emergency room confirms positive pregnancy.  We suspect that you are in first trimester pregnancy. Please call the women's health clinic at the number provided for additional obstetric related care.

## 2023-11-14 NOTE — ED Triage Notes (Signed)
 Pt to ED via POV c/o missed period. Reports 10 days late. Reports negative pregnancy test at home. Denies chance of pregnancy. Pt denies pain.

## 2024-01-09 ENCOUNTER — Encounter (HOSPITAL_COMMUNITY): Payer: Self-pay

## 2024-01-09 ENCOUNTER — Other Ambulatory Visit: Payer: Self-pay

## 2024-01-09 ENCOUNTER — Emergency Department (HOSPITAL_COMMUNITY)
Admission: EM | Admit: 2024-01-09 | Discharge: 2024-01-09 | Disposition: A | Discharge status: Home / Self Care | Attending: Emergency Medicine | Admitting: Emergency Medicine

## 2024-01-09 DIAGNOSIS — N3000 Acute cystitis without hematuria: Secondary | ICD-10-CM | POA: Diagnosis not present

## 2024-01-09 DIAGNOSIS — N939 Abnormal uterine and vaginal bleeding, unspecified: Secondary | ICD-10-CM | POA: Diagnosis present

## 2024-01-09 LAB — CBC WITH DIFFERENTIAL/PLATELET
Abs Immature Granulocytes: 0.03 K/uL (ref 0.00–0.07)
Basophils Absolute: 0 K/uL (ref 0.0–0.1)
Basophils Relative: 1 %
Eosinophils Absolute: 0.3 K/uL (ref 0.0–0.5)
Eosinophils Relative: 4 %
HCT: 40.1 % (ref 36.0–46.0)
Hemoglobin: 13.5 g/dL (ref 12.0–15.0)
Immature Granulocytes: 0 %
Lymphocytes Relative: 31 %
Lymphs Abs: 2.5 K/uL (ref 0.7–4.0)
MCH: 27.3 pg (ref 26.0–34.0)
MCHC: 33.7 g/dL (ref 30.0–36.0)
MCV: 81.2 fL (ref 80.0–100.0)
Monocytes Absolute: 0.4 K/uL (ref 0.1–1.0)
Monocytes Relative: 4 %
Neutro Abs: 4.9 K/uL (ref 1.7–7.7)
Neutrophils Relative %: 60 %
Platelets: 208 K/uL (ref 150–400)
RBC: 4.94 MIL/uL (ref 3.87–5.11)
RDW: 12.3 % (ref 11.5–15.5)
WBC: 8.1 K/uL (ref 4.0–10.5)
nRBC: 0 % (ref 0.0–0.2)

## 2024-01-09 LAB — COMPREHENSIVE METABOLIC PANEL WITH GFR
ALT: 24 U/L (ref 0–44)
AST: 22 U/L (ref 15–41)
Albumin: 3.7 g/dL (ref 3.5–5.0)
Alkaline Phosphatase: 65 U/L (ref 38–126)
Anion gap: 11 (ref 5–15)
BUN: 11 mg/dL (ref 6–20)
CO2: 24 mmol/L (ref 22–32)
Calcium: 9.3 mg/dL (ref 8.9–10.3)
Chloride: 102 mmol/L (ref 98–111)
Creatinine, Ser: 0.81 mg/dL (ref 0.44–1.00)
GFR, Estimated: >60 mL/min (ref >60)
Glucose, Bld: 232 mg/dL — ABNORMAL HIGH (ref 70–99)
Potassium: 3.9 mmol/L (ref 3.5–5.1)
Sodium: 137 mmol/L (ref 135–145)
Total Bilirubin: 0.5 mg/dL (ref 0.0–1.2)
Total Protein: 7.3 g/dL (ref 6.5–8.1)

## 2024-01-09 LAB — URINALYSIS, ROUTINE W REFLEX MICROSCOPIC
Bilirubin Urine: NEGATIVE
Glucose, UA: 150 mg/dL — AB
Ketones, ur: NEGATIVE mg/dL
Nitrite: NEGATIVE
Protein, ur: NEGATIVE mg/dL
Specific Gravity, Urine: 1.011 (ref 1.005–1.030)
WBC, UA: >50 WBC/hpf (ref 0–5)
pH: 6 (ref 5.0–8.0)

## 2024-01-09 LAB — PREGNANCY, URINE: Preg Test, Ur: NEGATIVE

## 2024-01-09 MED ORDER — CEPHALEXIN 500 MG PO CAPS: 500.0000 mg | ORAL_CAPSULE | Freq: Three times a day (TID) | ORAL | 0 refills | Status: AC

## 2024-01-09 MED ORDER — LEVOTHYROXINE SODIUM 112 MCG PO TABS: 112.0000 ug | ORAL_TABLET | Freq: Every day | ORAL | 0 refills | Status: AC

## 2024-01-09 NOTE — ED Triage Notes (Signed)
 C/o abd. Pain LMP 07/26 c/o bleeding off and on now. Denies n/v

## 2024-01-09 NOTE — Discharge Instructions (Addendum)
 Take antibiotic as prescribed.  Follow-up with women's health doctor.  I have represcribed your Synthroid .  Return if symptoms worsen.  When you follow-up with your primary care doctor discussed your lab work from today.  Your blood sugar was mildly elevated and they may need to do further testing to see if you have diabetes.

## 2024-01-09 NOTE — ED Provider Notes (Addendum)
 [place] EMERGENCY DEPARTMENT AT [facility] Provider Note   CSN: [id number] Arrival date & time: 01/09/24  9198     Patient presents with: Abdominal Pain   Debra Mills is a 38 y.o. female.   Patient here with abnormal vaginal bleeding since abortion about 2 months ago, she is also here for thyroid  medication refill.  She has a primary care doctor appointment in 2 weeks but has not been able to get her thyroid  medication filled here the last week or so.  She denies any weakness numbness tingling.  She is having some scant vaginal bleeding and then menstrual type bleeding.  She has had somewhat irregular bleeding since her abortion.  She has no abdominal pain nausea vomiting diarrhea.  The history is provided by the patient.       Prior to Admission medications   Medication Sig Start Date End Date Taking? Authorizing Provider  cephALEXin  (KEFLEX ) 500 MG capsule Take 1 capsule (500 mg total) by mouth 3 (three) times daily for 5 days. 01/09/24 01/14/24 Yes Nox Talent, DO  acetaminophen  (TYLENOL ) 500 MG tablet Take 1,000 mg by mouth every 6 (six) hours as needed for moderate pain or headache.  Patient not taking: No sig reported    [provider]  amoxicillin -clavulanate (AUGMENTIN ) 875-125 MG tablet Take 1 tablet by mouth every 12 (twelve) hours. 12/12/20   Sponseller, Pleasant SAUNDERS, PA-C  benzocaine  (ORAJEL) 10 % mucosal gel Use as directed 1 application in the mouth or throat as needed for mouth pain. 12/12/20   Sponseller, Rebekah R, PA-C  calcium -vitamin D  (OSCAL 500/200 D-3) 500-200 MG-UNIT tablet Take 1 tablet by mouth 2 (two) times daily. 04/22/18 04/22/19  Ramirez, Armando, MD  ciprofloxacin -dexamethasone  (CIPRODEX) OTIC suspension Place four drops into both ears 2 (two) times daily. Patient not taking: Reported on 10/18/2020 03/01/20   [provider]  HYDROcodone -acetaminophen  (NORCO/VICODIN) 5-325 MG tablet Take 1 tablet by mouth every 4 (four) hours as  needed. 09/16/21   Dean Clarity, MD  levothyroxine  (SYNTHROID ) 112 MCG tablet Take 1 tablet (112 mcg total) by mouth daily before breakfast. 01/09/24 02/08/24  Taurus Willis, DO  Magnesium  Oxide 200 MG TABS Take 1 tablet (200 mg total) by mouth daily. Patient not taking: No sig reported 04/22/18   Rubin Calamity, MD  omeprazole  (PRILOSEC) 20 MG capsule Take one capsule twice daily. 07/20/20 07/20/21  Hershal Ozell PARAS, MD  ondansetron  (ZOFRAN -ODT) 4 MG disintegrating tablet Take 1 tablet (4 mg total) by mouth every 8 (eight) hours as needed for nausea or vomiting. 09/16/21   Dean Clarity, MD  traMADol  (ULTRAM ) 50 MG tablet Take 1 tablet (50 mg total) by mouth every 6 (six) hours as needed (mild pain). Patient not taking: No sig reported 04/22/18   Rubin Calamity, MD    Allergies: Fish allergy    Review of Systems  Updated Vital Signs BP 112/79 (BP Location: Right Arm)   Pulse 75   Temp 98.4 F (36.9 C)   Resp 17   Ht 5' 1 (1.549 m)   Wt 82.6 kg   SpO2 100%   BMI 34.39 kg/m   Physical Exam Vitals and nursing note reviewed.  Constitutional:      General: She is not in acute distress.    Appearance: She is well-developed. She is not ill-appearing.  HENT:     Head: Normocephalic and atraumatic.     Mouth/Throat:     Mouth: Mucous membranes are moist.     Pharynx: Oropharynx  is clear.  Eyes:     Extraocular Movements: Extraocular movements intact.     Conjunctiva/sclera: Conjunctivae normal.     Pupils: Pupils are equal, round, and reactive to light.  Cardiovascular:     Rate and Rhythm: Normal rate and regular rhythm.     Heart sounds: Normal heart sounds. No murmur heard. Pulmonary:     Effort: Pulmonary effort is normal. No respiratory distress.     Breath sounds: Normal breath sounds.  Abdominal:     Palpations: Abdomen is soft.     Tenderness: There is no abdominal tenderness.  Musculoskeletal:        General: No swelling.     Cervical back: Neck supple.   Skin:    General: Skin is warm and dry.     Capillary Refill: Capillary refill takes less than 2 seconds.  Neurological:     Mental Status: She is alert.  Psychiatric:        Mood and Affect: Mood normal.     (all labs ordered are listed, but only abnormal results are displayed) Labs Reviewed  URINALYSIS, ROUTINE W REFLEX MICROSCOPIC - Abnormal; Notable for the following components:      Result Value   APPearance HAZY (*)    Glucose, UA 150 (*)    Hgb urine dipstick SMALL (*)    Leukocytes,Ua LARGE (*)    Bacteria, UA MANY (*)    All other components within normal limits  CBC WITH DIFFERENTIAL/PLATELET  PREGNANCY, URINE  COMPREHENSIVE METABOLIC PANEL WITH GFR    EKG: None  Radiology: No results found.   Procedures   Medications Ordered in the ED - No data to display                                  Medical Decision Making Amount and/or Complexity of Data Reviewed Labs: ordered.  Risk Prescription drug management.   Debra Mills is here for abnormal vaginal bleeding.  She has had somewhat irregular menstrual cycle since she had an abortion about 2 months ago.  Is not having any abdominal pain.  She will have light bleeding for few days and then may be having bleeding for a few days but nothing has gone back to her normal type cycle she states.  She is also asking for refill of her thyroid  medication as she is awaiting a new primary care doctor appointment in 2 weeks.  Otherwise she has no complaints.  Differential diagnosis likely irregular menstrual bleeding due to recent abortion and hormones likely are quite regulated yet.  She appears very well.  Is not having any abdominal pain.  Will make sure pregnancy test negative will check basic labs make sure she is not overly anemic.  Will send in refill to her thyroid  medicine.  Will have her follow-up with OB/GYN.  Overall urinalysis looks consistent with infection.  She is having some symptoms and we will treat with  antibiotics.  Pregnancy test is negative.  Hemoglobin is unremarkable.  Lab work otherwise unremarkable.  I reviewed interpreted labs.  She will follow-up with OB/GYN.  She will follow-up with primary care.  Understands return precautions.  Overall I suspect abnormal uterine bleeding given recent abortion likely hormones are regulating.  Also blood sugar was elevated today.  Recommended that she discuss this with her primary care doctor.  This chart was dictated using voice recognition software.  Despite best efforts to proofread,  errors can occur which can change the documentation meaning.      Final diagnoses:  Acute cystitis without hematuria  Abnormal uterine bleeding (AUB)    ED Discharge Orders          Ordered    levothyroxine  (SYNTHROID ) 112 MCG tablet  Daily before breakfast        01/09/24 0827    cephALEXin  (KEFLEX ) 500 MG capsule  3 times daily        01/09/24 0934               Ruthe Cornet, DO 01/09/24 0935    Ruthe Cornet, DO 01/09/24 [id number]

## 2024-01-11 LAB — URINE CULTURE: Culture: >=100000 — AB

## 2024-01-12 ENCOUNTER — Telehealth (HOSPITAL_BASED_OUTPATIENT_CLINIC_OR_DEPARTMENT_OTHER): Payer: Self-pay | Admitting: *Deleted

## 2024-01-12 NOTE — Telephone Encounter (Signed)
 Post ED Visit - Positive Culture Follow-up  Culture report reviewed by antimicrobial stewardship pharmacist: [facility] [x]  Rankin Sams Pharm.D. []  Venetia Gully, Pharm.D., BCPS AQ-ID []  Garrel Crews, Pharm.D., BCPS []  Almarie Lunger, Pharm.D., BCPS []  [place], [address].D., BCPS, AAHIVP []  Rosaline Bihari, Pharm.D., BCPS, AAHIVP []  Vernell Meier, PharmD, BCPS []  Latanya Hint, PharmD, BCPS []  Donald Medley, PharmD, BCPS []  Rocky Bold, PharmD []  Dorothyann Alert, PharmD, BCPS []  Morene Babe, PharmD  [facility] []  Rosaline Edison, PharmD []  Romona Bliss, PharmD []  Dolphus Roller, PharmD []  Veva Seip, Rph []  Vernell Daunt) Leonce, PharmD []  Eva Allis, PharmD []  Rosaline Millet, PharmD []  Iantha Batch, PharmD []  Arvin Gauss, PharmD []  Wanda Hasting, PharmD []  Ronal Rav, PharmD []  Rocky Slade, PharmD []  Bard Jeans, PharmD   Positive urine culture Treated with cephalexin , organism sensitive to the same and no further patient follow-up is required at this time.  Jama Lisle Blondie 01/12/2024, 9:01 AM
